# Patient Record
Sex: Female | Born: 1935
Health system: Southern US, Community
[De-identification: ages and names within clinical notes are randomized; demographics above are authoritative.]

## PROBLEM LIST (undated history)

## (undated) ENCOUNTER — Emergency Department: Discharge status: Absent without leave | Payer: Self-pay

## (undated) DIAGNOSIS — G20A1 Parkinson's disease without dyskinesia, without mention of fluctuations: Secondary | ICD-10-CM

## (undated) DIAGNOSIS — C801 Malignant (primary) neoplasm, unspecified: Secondary | ICD-10-CM

## (undated) DIAGNOSIS — M1990 Unspecified osteoarthritis, unspecified site: Secondary | ICD-10-CM

## (undated) DIAGNOSIS — L309 Dermatitis, unspecified: Secondary | ICD-10-CM

## (undated) DIAGNOSIS — G20 Parkinson's disease: Secondary | ICD-10-CM

## (undated) HISTORY — DX: Malignant (primary) neoplasm, unspecified: C80.1

## (undated) HISTORY — DX: Unspecified osteoarthritis, unspecified site: M19.90

## (undated) HISTORY — DX: Parkinson's disease: G20

## (undated) HISTORY — DX: Dermatitis, unspecified: L30.9

## (undated) HISTORY — DX: Parkinson's disease without dyskinesia, without mention of fluctuations: G20.A1

## (undated) HISTORY — PX: BRAIN SURGERY: SHX531

---

## 2002-09-18 HISTORY — PX: SPINE SURGERY: SHX786

## 2009-08-20 ENCOUNTER — Encounter: Payer: Self-pay | Admitting: Family Medicine

## 2009-09-02 ENCOUNTER — Encounter: Payer: Self-pay | Admitting: Family Medicine

## 2010-02-22 ENCOUNTER — Ambulatory Visit: Payer: Self-pay | Admitting: Family Medicine

## 2010-02-22 DIAGNOSIS — M1990 Unspecified osteoarthritis, unspecified site: Secondary | ICD-10-CM | POA: Insufficient documentation

## 2010-02-22 DIAGNOSIS — F411 Generalized anxiety disorder: Secondary | ICD-10-CM | POA: Insufficient documentation

## 2010-02-22 DIAGNOSIS — E785 Hyperlipidemia, unspecified: Secondary | ICD-10-CM | POA: Insufficient documentation

## 2010-02-22 DIAGNOSIS — G20 Parkinson's disease: Secondary | ICD-10-CM

## 2010-02-25 ENCOUNTER — Telehealth: Payer: Self-pay | Admitting: Family Medicine

## 2010-03-10 ENCOUNTER — Encounter: Payer: Self-pay | Admitting: Family Medicine

## 2010-03-17 ENCOUNTER — Encounter: Payer: Self-pay | Admitting: Family Medicine

## 2010-03-18 LAB — CONVERTED CEMR LAB
AST: 16 units/L (ref 0–37)
Albumin: 4.1 g/dL (ref 3.5–5.2)
Alkaline Phosphatase: 78 units/L (ref 39–117)
BUN: 26 mg/dL — ABNORMAL HIGH (ref 6–23)
Calcium: 9.7 mg/dL (ref 8.4–10.5)
Chloride: 106 meq/L (ref 96–112)
Glucose, Bld: 105 mg/dL — ABNORMAL HIGH (ref 70–99)
HDL: 64 mg/dL (ref >39)
Hemoglobin: 13 g/dL (ref 12.0–15.0)
LDL Cholesterol: 145 mg/dL — ABNORMAL HIGH (ref 0–99)
MCHC: 31 g/dL (ref 30.0–36.0)
Potassium: 4.8 meq/L (ref 3.5–5.3)
RBC: 4.21 M/uL (ref 3.87–5.11)
RDW: 14 % (ref 11.5–15.5)
Sodium: 139 meq/L (ref 135–145)
Total Protein: 6.6 g/dL (ref 6.0–8.3)

## 2010-03-25 ENCOUNTER — Encounter: Payer: Self-pay | Admitting: Family Medicine

## 2010-04-01 ENCOUNTER — Ambulatory Visit: Payer: Self-pay | Admitting: Family Medicine

## 2010-04-01 DIAGNOSIS — I10 Essential (primary) hypertension: Secondary | ICD-10-CM | POA: Insufficient documentation

## 2010-05-03 ENCOUNTER — Ambulatory Visit: Payer: Self-pay | Admitting: Family Medicine

## 2010-05-17 ENCOUNTER — Telehealth: Payer: Self-pay | Admitting: Family Medicine

## 2010-06-13 ENCOUNTER — Telehealth: Payer: Self-pay | Admitting: Family Medicine

## 2010-06-16 ENCOUNTER — Telehealth: Payer: Self-pay | Admitting: Family Medicine

## 2010-07-01 ENCOUNTER — Ambulatory Visit: Payer: Self-pay | Admitting: Family Medicine

## 2010-07-01 DIAGNOSIS — R350 Frequency of micturition: Secondary | ICD-10-CM

## 2010-07-01 LAB — CONVERTED CEMR LAB
Blood in Urine, dipstick: NEGATIVE
Glucose, Urine, Semiquant: NEGATIVE
Ketones, urine, test strip: NEGATIVE
Protein, U semiquant: NEGATIVE
Specific Gravity, Urine: 1.015
pH: 6

## 2010-07-04 LAB — CONVERTED CEMR LAB
ALT: 8 units/L (ref 0–35)
AST: 13 units/L (ref 0–37)
Albumin: 4.2 g/dL (ref 3.5–5.2)
CO2: 26 meq/L (ref 19–32)
Calcium: 9.9 mg/dL (ref 8.4–10.5)
Chloride: 106 meq/L (ref 96–112)
Direct LDL: 86 mg/dL
Potassium: 4.4 meq/L (ref 3.5–5.3)
Sodium: 140 meq/L (ref 135–145)
Total Protein: 6.7 g/dL (ref 6.0–8.3)

## 2010-07-07 ENCOUNTER — Encounter: Payer: Self-pay | Admitting: Family Medicine

## 2010-07-25 ENCOUNTER — Encounter: Payer: Self-pay | Admitting: Family Medicine

## 2010-08-25 ENCOUNTER — Encounter: Payer: Self-pay | Admitting: Family Medicine

## 2010-09-30 ENCOUNTER — Encounter: Payer: Self-pay | Admitting: Family Medicine

## 2010-10-03 ENCOUNTER — Ambulatory Visit
Admission: RE | Admit: 2010-10-03 | Discharge: 2010-10-03 | Payer: Self-pay | Source: Home / Self Care | Attending: Family Medicine | Admitting: Family Medicine

## 2010-10-10 ENCOUNTER — Encounter: Payer: Self-pay | Admitting: Family Medicine
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
## 2010-10-18 NOTE — Letter (Signed)
Summary: [facility]  [id number]   Imported By: Lanelle Bal 04/05/2010 14:41:09  _____________________________________________________________________  External Attachment:    Type:   Image     Comment:   External Document

## 2010-10-18 NOTE — Progress Notes (Signed)
Summary: Citalopram dose  Phone Note Call from Patient   Caller: Patient Summary of Call: Pt states she saw psych Dr. Cammy Copa at [facility] and he advised Pt to increase citalopram  but doesn't want to do this w/out consulting you. Pt states Dr. Farris Has would like to speak w/ you. Please call him at [phone]. Initial call taken by: Payton Spark CMA,  February 25, 2010 11:25 AM  Follow-up for Phone Call        Will contact pt in 10 days to see if we can go up to 20 mg of Citalopram daily.   Follow-up by: Seymour Bars DO,  February 25, 2010 12:00 PM     Appended Document: Citalopram dose Marcelino Duster, Pls call pt and let her know that I recieved call from Dr Farris Has.  he wants me to go up on her Citalopram dose to 20 mg once a day.  If she is doing OK on the 10's, let's go ahead and increase to 20 mg once a day.  She should have a f/u with me in about 1 month.  Seymour Bars, D.O.  Appended Document: Citalopram dose Pt aware of the above

## 2010-10-18 NOTE — Miscellaneous (Signed)
Summary: old records  Clinical Lists Changes  Observations: Added new observation of PAST MED HX: Parkinsons Dz- WF neuro s/p DBS  x 2 osteoarthritis asteotosis dermatitis hx of skin cancer-SCC  was seeing derm in [place] (03/10/2010 8:18) Added new observation of PRIMARY MD: Seymour Bars DO (03/10/2010 8:18)       Past History:  Past Medical History: Parkinsons Dz- WF neuro s/p DBS  x 2 osteoarthritis asteotosis dermatitis hx of skin cancer-SCC  was seeing derm in [place]

## 2010-10-18 NOTE — Letter (Signed)
Summary: [facility]  [facility]   Imported By: Lanelle Bal 08/01/2010 08:07:21  _____________________________________________________________________  External Attachment:    Type:   Image     Comment:   External Document

## 2010-10-18 NOTE — Consult Note (Signed)
Summary: [facility]  [facility]   Imported By: Lanelle Bal 08/05/2010 09:00:02  _____________________________________________________________________  External Attachment:    Type:   Image     Comment:   External Document

## 2010-10-18 NOTE — Letter (Signed)
Summary: Dermatology Records Dated 01-06-05 thru 04-22-08/Fred Novice MD  Dermatology Records Dated 01-06-05 thru 04-22-08/Fred Novice MD   Imported By: Lanelle Bal 03/23/2010 10:20:59  _____________________________________________________________________  External Attachment:    Type:   Image     Comment:   External Document

## 2010-10-18 NOTE — Progress Notes (Signed)
Summary: med  Phone Note Call from Patient Call back at [facility] Phone [phone]   Caller: Patient Call For: Seymour Bars DO Summary of Call: Pt calls and wants to know why you sent in Xanax rx instead of her Lorazepam 1mg . She wants the Lorazepam. Please call Initial call taken by: Kathlene November LPN,  June 16, 2010 12:49 PM  Follow-up for Phone Call        I can change her back if she did not pick up the alprazolam this month. Follow-up by: Seymour Bars DO,  June 17, 2010 8:08 AM  Additional Follow-up for Phone Call Additional follow up Details #1::        Pt notified. Did pick up the Xanax and she will take this this month but prefers from now on to have the Ativan. Additional Follow-up by: Kathlene November LPN,  June 17, 2010 9:22 AM    Additional Follow-up for Phone Call Additional follow up Details #2::    ok. Follow-up by: Seymour Bars DO,  June 17, 2010 10:03 AM

## 2010-10-18 NOTE — Letter (Signed)
Summary: Depression Questionnaire  Depression Questionnaire   Imported By: Lanelle Bal 04/11/2010 08:20:03  _____________________________________________________________________  External Attachment:    Type:   Image     Comment:   External Document

## 2010-10-18 NOTE — Assessment & Plan Note (Signed)
Summary: f/u PD/ mood/ bladder problems   Vital Signs:  Patient profile:   76 year old female Height:      62 inches Weight:      171 pounds BMI:     31.39 O2 Sat:      96 % on Room air Pulse rate:   68 / minute BP sitting:   133 / 77  (left arm) Cuff size:   regular  Vitals Entered By: Payton Spark CMA (July 01, 2010 1:20 PM)  O2 Flow:  Room air CC: F/U. Prefers Ativan over klonpin and alprazolam when time for next refill.    Primary Care Provider:  Seymour Bars DO  CC:  F/U. Prefers Ativan over klonpin and alprazolam when time for next refill. Marland Kitchen  History of Present Illness: 76 yo WF presents for f/u visit.  For her HTN, she was started and then increased on Atenolol since last visit.  Doing well on it.  Denies CP but has chronic DOE.  She had PFTs done prior to her DBS in the Spring and was told that her PD may be affecting her diaphragm.  She feels like this is getting worse and she cannot fill her lungs w/ air.    Having dysuria.  Needs hat.  Having urinary frequency.  Having some incontinence. Having some flank pain and incomplete empying.  She has a parkinsons f/u with Dr Ardelle Park next wk.  On Sinemet.  s/p DBS x 2.  Seeing psychologist.  Court battle is over.  Doing well on 40 mg of Citalopram daily.  Wants to stick with with Ativan for as needed use.      Current Medications (verified): 1)  Sinemet 25-100 Mg Tabs (Carbidopa-Levodopa) .... Take 1 1/2 Tab By Mouth Every 2 Hours 2)  Mirapex 0.5 Mg Tabs (Pramipexole Dihydrochloride) .... Take 1 Tab By Mouth Three Times A Day 3)  Citalopram Hydrobromide 20 Mg Tabs (Citalopram Hydrobromide) .Marland Kitchen.. 1 Tab By Mouth At Bedtime X 2 Wks Then 1.5 Tabs By Mouth At Bedtime X 2 Wks Then 2 Tabs By Mouth Qhs 4)  Simvastatin 20 Mg Tabs (Simvastatin) .Marland Kitchen.. 1 Tab By Mouth Qhs 5)  Atenolol 50 Mg Tabs (Atenolol) .Marland Kitchen.. 1 Tab By Mouth Daily 6)  Etodolac 400 Mg Tabs (Etodolac) .Marland Kitchen.. 1 Tab By Mouth Two Times A Day With Food As Needed For Joint  Pain  Allergies (verified): 1)  ! Codeine  Past History:  Past Medical History: Reviewed history from 03/10/2010 and no changes required. Parkinsons Dz- WF neuro s/p DBS  x 2 osteoarthritis asteotosis dermatitis hx of skin cancer-SCC  was seeing derm in [place]  Past Surgical History: Reviewed history from 02/22/2010 and no changes required. cervical spine fusion. 2004 deep brain stimulator 9-10 and 4-11  Family History: Reviewed history from 02/22/2010 and no changes required. mother healthy, 43 father died 83s, CVA? sister in [place] - healthy  no fam hx of parkinsons dz.  Social History: Reviewed history from 02/22/2010 and no changes required. 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  - daughter here in [place], daughter in [place], daughter in [place] (youngest) Never smoked.  Denies ETOH. Active.  Lives alone.  Review of Systems      See HPI  Physical Exam  General:  alert, well-developed, well-nourished, and well-hydrated.   Eyes:  sclera non icteric Mouth:  pharynx pink and moist.   Chest Wall:  no tenderness.   Lungs:  clear breath sounds, labored with deep inspiration, No W/R/R,  Heart:  Normal rate and regular rhythm. S1 and S2 normal without gallop, murmur, click, rub or other extra sounds. Extremities:  no LE edema Neurologic:  resting tremor - head, hands, body Skin:  color normal.   Psych:  good eye contact, not anxious appearing, and not depressed appearing.     Impression & Recommendations:  Problem # 1:  FREQUENCY, URINARY (ICD-788.41) UA is normal.  Suspect neurogenic bladder secondary to PD.  Will refer to urology for a PVR/ bladder scan. Orders: UA Dipstick w/o Micro (automated)  (81003) Urology Referral (Urology)  Problem # 2:  ESSENTIAL HYPERTENSION, BENIGN (ICD-401.1) Assessment: Improved  Her updated medication list for this problem includes:    Atenolol 50 Mg Tabs (Atenolol) .Marland Kitchen... 1 tab by mouth  daily  Orders: T-Comprehensive Metabolic Panel ([id number])  BP today: 133/77 Prior BP: 141/87 (05/03/2010)  Labs Reviewed: K+: 4.8 (03/17/2010) Creat: : 0.85 (03/17/2010)   Chol: 225 (03/17/2010)   HDL: 64 (03/17/2010)   LDL: 145 (03/17/2010)   TG: 81 (03/17/2010)  Problem # 3:  OSTEOARTHRITIS, MODERATE (ICD-715.90) Chronic daily pain.  Had been using Etodolac but sporadically. I have asked her to alternate scheduled Etodolac with Tyelnol arthritis for pain. Her updated medication list for this problem includes:    Etodolac 400 Mg Tabs (Etodolac) .Marland Kitchen... 1 tab by mouth two times a day with food as needed for joint pain  Problem # 4:  ANXIETY STATE, UNSPECIFIED (ICD-300.00) Doing well on higher dose of Citalopram with as needed use of Ativan and counseling. The following medications were removed from the medication list:    Klonopin 0.5 Mg Tabs (Clonazepam) .Marland Kitchen... 1 tab by mouth at bedtime as needed anxiety/ sleep    Alprazolam 1 Mg Tabs (Alprazolam) .Marland Kitchen... 1 tab by mouth daily as needed for anxiety Her updated medication list for this problem includes:    Citalopram Hydrobromide 40 Mg Tabs (Citalopram hydrobromide) .Marland Kitchen... 1 tab by mouth daily    Ativan 0.5 Mg Tabs (Lorazepam) .Marland Kitchen... 1 tab by mouth two times a day as needed anxiety  Problem # 5:  HYPERLIPIDEMIA (ICD-272.4) Due for LDL and LFTs. Her updated medication list for this problem includes:    Simvastatin 20 Mg Tabs (Simvastatin) .Marland Kitchen... 1 tab by mouth qhs  Orders: T-Lipoprotein (LDL cholesterol)  ([id number])  Labs Reviewed: SGOT: 16 (03/17/2010)   SGPT: <8 U/L (03/17/2010)   HDL:64 (03/17/2010)  LDL:145 (03/17/2010)  Chol:225 (03/17/2010)  Trig:81 (03/17/2010)  Problem # 6:  PARKINSON'S DISEASE (ICD-332.0) Has f/u with Dr Ardelle Park next wk.  She needs to talk to him about the effects of her medications.  She is not happy w/ the results of her previous 2 DBS procedures.  I suspect that she has a neurogenic bladder and  diaphragm dysunction in relation to her PD. Will ask Dr Ardelle Park to address breathing issue - if she needs Pulm referral or not.  Apparently had PFTS this spring in [place]> Orders: Urology Referral (Urology)  Complete Medication List: 1)  Sinemet 25-100 Mg Tabs (Carbidopa-levodopa) .... Take 1 1/2 tab by mouth every 2 hours 2)  Mirapex 0.5 Mg Tabs (Pramipexole dihydrochloride) .... Take 1 tab by mouth three times a day 3)  Citalopram Hydrobromide 40 Mg Tabs (Citalopram hydrobromide) .Marland Kitchen.. 1 tab by mouth daily 4)  Simvastatin 20 Mg Tabs (Simvastatin) .Marland Kitchen.. 1 tab by mouth qhs 5)  Atenolol 50 Mg Tabs (Atenolol) .Marland Kitchen.. 1 tab by mouth daily 6)  Etodolac 400 Mg Tabs (Etodolac) .Marland Kitchen.. 1 tab by mouth two  times a day with food as needed for joint pain 7)  Ativan 0.5 Mg Tabs (Lorazepam) .Marland Kitchen.. 1 tab by mouth two times a day as needed anxiety  Patient Instructions: 1)  UA today. 2)  If normal, will refer to urology for a post void residual ultrasound. 3)  I will send a note back to Dr Ardelle Park re: neuro vs pulm referral for diaphgram problems (?). 4)  For arthritis pain:  Take Etodolac with breakfast and dinner  and Tylenol arthritis with lunch and bedtime. 5)  Labs today.  Will call you w/ results on Monday. 6)  Return for f/u in 3 mos.   Prescriptions: ETODOLAC 400 MG TABS (ETODOLAC) 1 tab by mouth two times a day with food as needed for joint pain  #60 x 3   Entered and Authorized by:   Seymour Bars DO   Signed by:   Seymour Bars DO on 07/01/2010   Method used:   Electronically to        [facility]* (retail)       [address]       [place], [place]  [address]       Ph: [phone]       Fax: [phone]   RxID:   [id number] ATIVAN 0.5 MG TABS (LORAZEPAM) 1 tab by mouth two times a day as needed anxiety  #60 x 0   Entered and Authorized by:   Seymour Bars DO   Signed by:   Seymour Bars DO on 07/01/2010   Method used:   Printed then faxed to ...       [facility]*  (retail)       [address]       [place], [place]  [address]       Ph: [phone]       Fax: [phone]   RxID:   [id number] CITALOPRAM HYDROBROMIDE 40 MG TABS (CITALOPRAM HYDROBROMIDE) 1 tab by mouth daily  #30 x 6   Entered and Authorized by:   Seymour Bars DO   Signed by:   Seymour Bars DO on 07/01/2010   Method used:   Electronically to        [facility]* (retail)       [address]       [place], [place]  [address]       Ph: [phone]       Fax: [phone]   RxID:   [id number]   Laboratory Results   Urine Tests    Routine Urinalysis   Color: yellow Appearance: Clear Glucose: negative   (Normal Range: Negative) Bilirubin: small   (Normal Range: Negative) Ketone: negative   (Normal Range: Negative) Spec. Gravity: 1.015   (Normal Range: 1.003-1.035) Blood: negative   (Normal Range: Negative) pH: 6.0   (Normal Range: 5.0-8.0) Protein: negative   (Normal Range: Negative) Urobilinogen: 0.2   (Normal Range: 0-1) Nitrite: negative   (Normal Range: Negative) Leukocyte Esterace: trace   (Normal Range: Negative)       Appended Document: f/u PD/ mood/ bladder problems Pls let pt know that her UA came back normal.  I am going to proceed with a urology referral for a post void residal bladder scan.  Seymour Bars, D.O.  Appended Document: f/u PD/ mood/ bladder problems Pt aware of the above

## 2010-10-18 NOTE — Letter (Signed)
Summary: [facility]  [facility]   Imported By: Lanelle Bal 07/30/2010 11:45:36  _____________________________________________________________________  External Attachment:    Type:   Image     Comment:   External Document

## 2010-10-18 NOTE — Progress Notes (Signed)
Summary: Ativan Rx  Phone Note Call from Patient   Caller: Patient Summary of Call: Pt states she went to see Dr. Cammy Copa and he suggested she be changed from Alprazolam to Ativan. Pt states she is not sure why he is not making the changes and filling meds but he advised her to call here for med changes. Please advise. Initial call taken by: Payton Spark CMA,  May 17, 2010 9:24 AM    New/Updated Medications: ATIVAN 0.5 MG TABS (LORAZEPAM) 1 tab by mouth daily as needed for anxiety Prescriptions: ATIVAN 0.5 MG TABS (LORAZEPAM) 1 tab by mouth daily as needed for anxiety  #30 x 0   Entered and Authorized by:   Seymour Bars DO   Signed by:   Seymour Bars DO on 05/17/2010   Method used:   Printed then faxed to ...       [facility]* (retail)       [address]       [place], [place]  [address]       Ph: [phone]       Fax: [phone]   RxID:   [id number]   Appended Document: Ativan Rx Pt aware.

## 2010-10-18 NOTE — Assessment & Plan Note (Signed)
Summary: BP check  Nurse Visit   Vital Signs:  Patient profile:   76 year old female Pulse rate:   75 / minute BP sitting:   141 / 87  (left arm) Cuff size:   regular  Vitals Entered By: Payton Spark CMA (May 03, 2010 1:39 PM)  Current Medications (verified): 1)  Sinemet 25-100 Mg Tabs (Carbidopa-Levodopa) .... Take 1 1/2 Tab By Mouth Every 2 Hours 2)  Mirapex 0.5 Mg Tabs (Pramipexole Dihydrochloride) .... Take 1 Tab By Mouth Three Times A Day 3)  Klonopin 0.5 Mg Tabs (Clonazepam) .Marland Kitchen.. 1 Tab By Mouth At Bedtime As Needed Anxiety/ Sleep 4)  Citalopram Hydrobromide 20 Mg Tabs (Citalopram Hydrobromide) .Marland Kitchen.. 1 Tab By Mouth At Bedtime X 2 Wks Then 1.5 Tabs By Mouth At Bedtime X 2 Wks Then 2 Tabs By Mouth Qhs 5)  Simvastatin 20 Mg Tabs (Simvastatin) .Marland Kitchen.. 1 Tab By Mouth Qhs 6)  Alprazolam 0.5 Mg Tabs (Alprazolam) .Marland Kitchen.. 1 Tab By Mouth Daily As Needed For Anxiety (For Daytime Use) 7)  Atenolol 25 Mg Tabs (Atenolol) .Marland Kitchen.. 1 Tab By Mouth Daily 8)  Etodolac 400 Mg Tabs (Etodolac) .Marland Kitchen.. 1 Tab By Mouth Two Times A Day With Food As Needed For Joint Pain  Allergies (verified): 1)  ! Codeine  Orders Added: 1)  Est. Patient Level I [[address]] Prescriptions: ATENOLOL 50 MG TABS (ATENOLOL) 1 tab by mouth daily  #30 x 3   Entered and Authorized by:   Seymour Bars DO   Signed by:   Seymour Bars DO on 05/03/2010   Method used:   Electronically to        [facility]* (retail)       [address]       [place], [place]  [address]       Ph: [phone]       Fax: [phone]   RxID:   [id number]    CC: Follow-up HTN HPI: Taking meds? yes     Side effects? no   Chest pain, SOB, Dizziness? no   A/P: HTN (401.1) At goal? If no, Patient will be notified.  5 minutes was spent with patient.   Impression & Recommendations:  Problem # 1:  ESSENTIAL HYPERTENSION, BENIGN (ICD-401.1) BP still too high.  Will increase Atenolol from 25 mg --> 50 mg/ day.  HR is normal  today. Recheck at routine f/u visit in October. Her updated medication list for this problem includes:    Atenolol 50 Mg Tabs (Atenolol) .Marland Kitchen... 1 tab by mouth daily  Orders: Est. Patient Level I ([address])  BP today: 141/87 Prior BP: 159/94 (04/01/2010)  Labs Reviewed: K+: 4.8 (03/17/2010) Creat: : 0.85 (03/17/2010)   Chol: 225 (03/17/2010)   HDL: 64 (03/17/2010)   LDL: 145 (03/17/2010)   TG: 81 (03/17/2010)  Complete Medication List: 1)  Sinemet 25-100 Mg Tabs (Carbidopa-levodopa) .... Take 1 1/2 tab by mouth every 2 hours 2)  Mirapex 0.5 Mg Tabs (Pramipexole dihydrochloride) .... Take 1 tab by mouth three times a day 3)  Klonopin 0.5 Mg Tabs (Clonazepam) .Marland Kitchen.. 1 tab by mouth at bedtime as needed anxiety/ sleep 4)  Citalopram Hydrobromide 20 Mg Tabs (Citalopram hydrobromide) .Marland Kitchen.. 1 tab by mouth at bedtime x 2 wks then 1.5 tabs by mouth at bedtime x 2 wks then 2 tabs by mouth qhs 5)  Simvastatin 20 Mg Tabs (Simvastatin) .Marland Kitchen.. 1 tab by mouth qhs 6)  Alprazolam 0.5 Mg Tabs (Alprazolam) .Marland Kitchen.. 1 tab by mouth  daily as needed for anxiety (for daytime use) 7)  Atenolol 50 Mg Tabs (Atenolol) .Marland Kitchen.. 1 tab by mouth daily 8)  Etodolac 400 Mg Tabs (Etodolac) .Marland Kitchen.. 1 tab by mouth two times a day with food as needed for joint pain   Appended Document: BP check LMOM informing Pt of the above

## 2010-10-18 NOTE — Progress Notes (Signed)
Summary: Med  Phone Note Call from Patient Call back at Home Phone [phone]   Caller: Patient Call For: Seymour Bars DO Summary of Call: Pt calls and states that her psychiatrist suggest her Ativan be increased. Please advise Initial call taken by: Kathlene November,  June 13, 2010 1:41 PM  Follow-up for Phone Call        in strength or frequency? Follow-up by: Seymour Bars DO,  June 13, 2010 2:04 PM  Additional Follow-up for Phone Call Additional follow up Details #1::        Strength Additional Follow-up by: Kathlene November,  June 13, 2010 2:06 PM    New/Updated Medications: ALPRAZOLAM 1 MG TABS (ALPRAZOLAM) 1 tab by mouth daily as needed for anxiety Prescriptions: ALPRAZOLAM 1 MG TABS (ALPRAZOLAM) 1 tab by mouth daily as needed for anxiety  #30 x 0   Entered and Authorized by:   Seymour Bars DO   Signed by:   Seymour Bars DO on 06/13/2010   Method used:   Printed then faxed to ...       [facility]* (retail)       [address]       [place], [place]  [address]       Ph: [phone]       Fax: [phone]   RxID:   [id number]   Appended Document: Med 06/13/2010 @ 2:56pm- Pt notified of above. KJ LPN

## 2010-10-20 NOTE — Letter (Signed)
Summary: [facility]  [facility]   Imported By: Lanelle Bal 10/13/2010 11:22:19  _____________________________________________________________________  External Attachment:    Type:   Image     Comment:   External Document

## 2010-10-20 NOTE — Assessment & Plan Note (Signed)
Summary: f/u PD/ mood   Vital Signs:  Patient profile:   75 year old female Height:      62 inches Weight:      177 pounds BMI:     32.49 O2 Sat:      96 % on Room air Pulse rate:   62 / minute BP sitting:   126 / 77  (left arm) Cuff size:   regular  Vitals Entered By: Payton Spark CMA (October 03, 2010 1:21 PM)  O2 Flow:  Room air CC: F/U.   Primary Care Provider:  Seymour Bars DO  CC:  F/U.Marland Kitchen  History of Present Illness: 76 yo WF presents for f/u visit.  She was put on Dr McDiamid for her bladder and is doing better on Vesicare.  She has a lot of lower back pain from her arthritis and has chronic HAs ever since her stimulator was placed.  She is taking Tylneol but needing ' too much' for comfort.  Not using her CPAP due to runny nose.  She takes Etodolac but this seems to make her sleepy so she's only taking it at night.    Needing Pepcid AC for heartburn which does help.      Current Medications (verified): 1)  Sinemet 25-100 Mg Tabs (Carbidopa-Levodopa) .... Take 1 1/2 Tab By Mouth Every 2 Hours 2)  Mirapex 0.5 Mg Tabs (Pramipexole Dihydrochloride) .... Take 1 Tab By Mouth Three Times A Day 3)  Simvastatin 20 Mg Tabs (Simvastatin) .Marland Kitchen.. 1 Tab By Mouth Qhs 4)  Atenolol 50 Mg Tabs (Atenolol) .Marland Kitchen.. 1 Tab By Mouth Daily 5)  Etodolac 400 Mg Tabs (Etodolac) .... Take 1 Tab By Mouth At Bedtime As Needed For Joint Pain 6)  Ativan 0.5 Mg Tabs (Lorazepam) .Marland Kitchen.. 1 Tab By Mouth Two Times A Day As Needed Anxiety 7)  Vesicare 5 Mg Tabs (Solifenacin Succinate) .... Take 1 Tab By Mouth Once Daily  Allergies (verified): 1)  ! Codeine  Past History:  Past Medical History: Parkinsons Dz- WF neuro s/p DBS  x 2 osteoarthritis asteotosis dermatitis hx of skin cancer-SCC  was seeing derm in [place] pulm Dr Lonn [place]  Past Surgical History: Reviewed history from 02/22/2010 and no changes required. cervical spine fusion. 2004 deep brain stimulator 9-10 and 4-11  Social  History: Reviewed history from 02/22/2010 and no changes required. 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  - daughter here in [place], daughter in [place], daughter in [place] (youngest) Never smoked.  Denies ETOH. Active.  Lives alone.  Review of Systems      See HPI  Physical Exam  General:  alert, well-developed, well-nourished, and well-hydrated.   Mouth:  pharynx pink and moist.   Neck:  no masses.   Chest Wall:  no tenderness.   Lungs:  clear breath sounds, labored with deep inspiration, No W/R/R, Heart:  Normal rate and regular rhythm. S1 and S2 normal without gallop, murmur, click, rub or other extra sounds. Abdomen:  soft and non-tender.   Extremities:  no LEedema Neurologic:  resting tremor Skin:  color normal.   Psych:  good eye contact, not anxious appearing, and not depressed appearing.     Impression & Recommendations:  Problem # 1:  OSTEOARTHRITIS, MODERATE (ICD-715.90) Back pain continues to bother her, worsened by weight gain and lack of exercise with worsening parkinsons. Had sedation from Etodolac so I will change her to Celebrex 1-2 x a day alternating with Tylenol for a total of up to 3 g/  day.   Her updated medication list for this problem includes:    Celebrex 200 Mg Caps (Celecoxib) .Marland Kitchen... 1 capsule by mouth once to twice daily  Problem # 2:  ESSENTIAL HYPERTENSION, BENIGN (ICD-401.1) BP at goal on current meds.  Continuel  Labs UTD.   Her updated medication list for this problem includes:    Atenolol 50 Mg Tabs (Atenolol) .Marland Kitchen... 1 tab by mouth daily  BP today: 126/77 Prior BP: 133/77 (07/01/2010)  Labs Reviewed: K+: 4.4 (07/01/2010) Creat: : 0.77 (07/01/2010)   Chol: 225 (03/17/2010)   HDL: 64 (03/17/2010)   LDL: 145 (03/17/2010)   TG: 81 (03/17/2010)  Problem # 3:  ANXIETY STATE, UNSPECIFIED (ICD-300.00) Pt stopped Citalopram, not sure why and is doing well with just as needed Ativan.  She seems to be past her court battle and is doing  better. The following medications were removed from the medication list:    Citalopram Hydrobromide 40 Mg Tabs (Citalopram hydrobromide) .Marland Kitchen... 1 tab by mouth daily Her updated medication list for this problem includes:    Ativan 0.5 Mg Tabs (Lorazepam) .Marland Kitchen... 1 tab by mouth two times a day as needed anxiety  Problem # 4:  PARKINSON'S DISEASE (ICD-332.0) Has f/u with Dr Ardelle Park and is seeing Dr Lonn [place] (pulm) to see if she has diaphragmatic involvment causing difficulty with breathing.    Complete Medication List: 1)  Sinemet 25-100 Mg Tabs (Carbidopa-levodopa) .... Take 1 1/2 tab by mouth every 2 hours 2)  Mirapex 0.5 Mg Tabs (Pramipexole dihydrochloride) .... Take 1 tab by mouth three times a day 3)  Simvastatin 20 Mg Tabs (Simvastatin) .Marland Kitchen.. 1 tab by mouth qhs 4)  Atenolol 50 Mg Tabs (Atenolol) .Marland Kitchen.. 1 tab by mouth daily 5)  Celebrex 200 Mg Caps (Celecoxib) .Marland Kitchen.. 1 capsule by mouth once to twice daily 6)  Ativan 0.5 Mg Tabs (Lorazepam) .Marland Kitchen.. 1 tab by mouth two times a day as needed anxiety 7)  Vesicare 5 Mg Tabs (Solifenacin succinate) .... Take 1 tab by mouth once daily 8)  Amantadine Hcl 100 Mg Tabs (Amantadine hcl) .Marland Kitchen.. 1 tab by mouth tid 9)  Pepcid 20 Mg Tabs (Famotidine) .Marland Kitchen.. 1 tab by mouth two times a day  Patient Instructions: 1)  Alternate Tylenol 1000 mg 2-3 x a day with Celebrex 200 mg 1-2 x a day for back arthritis pain. 2)  Add PT if not improved after 3 wks. 3)  Return for f/u in 2 mos. Prescriptions: CELEBREX 200 MG CAPS (CELECOXIB) 1 capsule by mouth once to twice daily  #60 x 2   Entered and Authorized by:   Seymour Bars DO   Signed by:   Seymour Bars DO on 10/03/2010   Method used:   Electronically to        [facility]* (retail)       [address]       [place], [place]  [address]       Ph: [phone]       Fax: [phone]   RxID:   [id number]    Orders Added: 1)  Est. Patient Level IV [[address]]

## 2010-10-20 NOTE — Letter (Signed)
Summary: [facility]  [facility]   Imported By: Lanelle Bal 09/28/2010 09:55:24  _____________________________________________________________________  External Attachment:    Type:   Image     Comment:   External Document

## 2010-11-09 NOTE — Medication Information (Signed)
Summary: Approval for Celebrex/[employer]  Approval for Celebrex/[employer]   Imported By: Lanelle Bal 11/01/2010 12:28:44  _____________________________________________________________________  External Attachment:    Type:   Image     Comment:   External Document

## 2010-12-02 ENCOUNTER — Encounter: Payer: Self-pay | Admitting: Family Medicine

## 2010-12-02 ENCOUNTER — Ambulatory Visit (INDEPENDENT_AMBULATORY_CARE_PROVIDER_SITE_OTHER): Payer: Medicare Other | Admitting: Family Medicine

## 2010-12-02 DIAGNOSIS — G20 Parkinson's disease: Secondary | ICD-10-CM

## 2010-12-02 DIAGNOSIS — F411 Generalized anxiety disorder: Secondary | ICD-10-CM

## 2010-12-02 DIAGNOSIS — E669 Obesity, unspecified: Secondary | ICD-10-CM | POA: Insufficient documentation
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
## 2010-12-21 ENCOUNTER — Telehealth: Payer: Self-pay | Admitting: *Deleted

## 2010-12-21 MED ORDER — NAPROXEN 500 MG PO TABS: 500.0000 mg | ORAL_TABLET | Freq: Two times a day (BID) | ORAL | Status: AC

## 2010-12-21 NOTE — Telephone Encounter (Signed)
Return call to home number-which is preferred number per pt.  No answer after 8 rings.

## 2010-12-21 NOTE — Telephone Encounter (Signed)
Pt went to see Dr. Roetta Sessions at [facility] for her back pain.  He has recommended PT which pt is agreeable to.  Dr. Roetta Sessions also recommended an antiinflammatory if it will not be contraindicated with any medication the pt is currently on.  Dr. Roetta Sessions will prescribe if it is OK with you.  Please advise.

## 2010-12-21 NOTE — Telephone Encounter (Signed)
Her last Creatinine was normal and I do not see a hx of GI bleed, so I sent in RX for Naprosyn 500 mg 1 tab with breakfast and dinner as needed for back pain.  Call me if any problems.

## 2010-12-22 NOTE — Telephone Encounter (Signed)
Pt.notified

## 2010-12-29 ENCOUNTER — Other Ambulatory Visit: Payer: Self-pay | Admitting: *Deleted

## 2010-12-29 MED ORDER — SIMVASTATIN 20 MG PO TABS: 20.0000 mg | ORAL_TABLET | Freq: Every day | ORAL | Status: DC

## 2011-01-19 ENCOUNTER — Other Ambulatory Visit: Payer: Self-pay | Admitting: *Deleted

## 2011-01-19 MED ORDER — LORAZEPAM 0.5 MG PO TABS: 0.5000 mg | ORAL_TABLET | Freq: Two times a day (BID) | ORAL | Status: DC | PRN

## 2011-01-19 MED ORDER — CELECOXIB 200 MG PO CAPS: 200.0000 mg | ORAL_CAPSULE | Freq: Two times a day (BID) | ORAL | Status: DC

## 2011-01-30 ENCOUNTER — Encounter: Payer: Self-pay | Admitting: Family Medicine

## 2011-02-03 ENCOUNTER — Ambulatory Visit (INDEPENDENT_AMBULATORY_CARE_PROVIDER_SITE_OTHER): Payer: Medicare Other | Admitting: Family Medicine

## 2011-02-03 ENCOUNTER — Encounter: Payer: Self-pay | Admitting: Family Medicine

## 2011-02-03 DIAGNOSIS — G20 Parkinson's disease: Secondary | ICD-10-CM

## 2011-02-03 DIAGNOSIS — F411 Generalized anxiety disorder: Secondary | ICD-10-CM

## 2011-02-03 MED ORDER — FLUOXETINE HCL 20 MG PO CAPS
20.0000 mg | ORAL_CAPSULE | Freq: Every day | ORAL | Status: DC
Start: 2011-02-03 — End: 2011-09-27

## 2011-02-03 MED ORDER — ZOLPIDEM TARTRATE 5 MG PO TABS: 5.0000 mg | ORAL_TABLET | Freq: Every evening | ORAL | Status: DC | PRN

## 2011-02-03 NOTE — Patient Instructions (Signed)
Increase Fluoxetine to 20 mg once daily. OK to use Ativan 1-2 x a day for anxiety but separate last dose from Ambien by 4 hrs.  Allow 8 hrs to sleep after taking Ambien.  F/U with [facility] neurology for Parkinsons.  Work on increasing physical activity to 45 + min most days/ wk.  Return for f/u with fasting labs in 3- 4 mos.

## 2011-02-03 NOTE — Assessment & Plan Note (Signed)
Will increase her fluoxetine dose to 20 mg once daily and stay on ativan prn.  Added a sleep aid for chronic insomnia which is compromising her ability to keep her CPAP on.  Do not take Ativan with Ambien (separate by 4 hrs).  Call if any problems.  F/u in 2-3 mos.

## 2011-02-03 NOTE — Assessment & Plan Note (Signed)
Stable.  Followed by [facility]. I do not suspect her SOB to be related to her PD as she has anxiety and increased truncal obesity as contriuting factors.  We discussed options to get weight off today and increase her phyiscal activity level but she does not seem very motivated.

## 2011-02-03 NOTE — Progress Notes (Signed)
  Subjective:    Patient ID: Shannon Vincent, female    DOB: 1935-03-31, 76 y.o.   MRN: [id number]  HPI  76 yo WF prsents for f/u anxiety.  2 mos ago, I started her on Fluoxetine for anxiety and it has really helped.  She is taking ativan 2 x a day.  Her stress level has improved some.  She is only on 10 mg of Fluoxetine.  She is not taking the Atenolol because BP has been low.  She has had f/u with [facility], pulm and neuro for her Parkinsons.  SOB is unchanged, worsened by anxiety and wt gain.  Eats 'very little' during the day but does limited exercise.  Has gained another 3 lbs since last visit.  BP 111/73  Pulse 95  Ht 5' (1.524 m)  Wt 180 lb (81.647 kg)  BMI 35.15 kg/m2  SpO2 96%     Review of Systems  Constitutional: Negative for fatigue.  Respiratory: Negative for shortness of breath.   Cardiovascular: Negative for chest pain, palpitations and leg swelling.  Gastrointestinal: Negative for nausea.  Genitourinary: Negative for difficulty urinating.  Musculoskeletal: Positive for gait problem.  Neurological: Negative for dizziness and headaches.  Psychiatric/Behavioral: Positive for sleep disturbance. Negative for suicidal ideas, self-injury and dysphoric mood. The patient is nervous/anxious.         Objective:   Physical Exam  Constitutional: She appears well-developed and well-nourished. No distress.  HENT:  Head: Normocephalic and atraumatic.  Neck: Neck supple. No thyromegaly present.  Cardiovascular: Normal rate, regular rhythm and normal heart sounds.   Pulmonary/Chest: Effort normal and breath sounds normal.  Musculoskeletal: She exhibits no edema.  Skin: Skin is warm and dry.  Psychiatric: She has a normal mood and affect.          Assessment & Plan:

## 2011-03-23 ENCOUNTER — Other Ambulatory Visit: Payer: Self-pay | Admitting: *Deleted

## 2011-03-23 MED ORDER — CELECOXIB 200 MG PO CAPS: 200.0000 mg | ORAL_CAPSULE | Freq: Two times a day (BID) | ORAL | Status: DC

## 2011-03-30 ENCOUNTER — Other Ambulatory Visit: Payer: Self-pay | Admitting: *Deleted

## 2011-03-30 MED ORDER — LORAZEPAM 0.5 MG PO TABS: 0.5000 mg | ORAL_TABLET | Freq: Two times a day (BID) | ORAL | Status: DC | PRN

## 2011-04-12 ENCOUNTER — Telehealth: Payer: Self-pay | Admitting: Family Medicine

## 2011-04-12 NOTE — Telephone Encounter (Signed)
Marcelino Duster called from [employer] and said they were sending a form to be signed for a request for pt to get a lumbar brace. Jarvis Newcomer, LPN [facility]

## 2011-04-13 ENCOUNTER — Telehealth: Payer: Self-pay | Admitting: Family Medicine

## 2011-04-13 NOTE — Telephone Encounter (Signed)
Company called again to ask about form they sent for pt to be given auth for back brace.  They sent a certificate to be signed, and said they have not received the certificate back yet.  Please advise. Plan:  Routed the message to Dr. Arlice Colt, LPN [facility]

## 2011-04-13 NOTE — Telephone Encounter (Signed)
I signed this yesterday.  It was in Michelle's box this morning.

## 2011-04-14 ENCOUNTER — Other Ambulatory Visit: Payer: Self-pay | Admitting: Family Medicine

## 2011-04-14 NOTE — Telephone Encounter (Signed)
Back brace order was placed in Shannon Vincent's box to fax. Jarvis Newcomer, LPN [facility]imes

## 2011-05-11 ENCOUNTER — Telehealth: Payer: Self-pay | Admitting: Family Medicine

## 2011-05-11 NOTE — Telephone Encounter (Signed)
Jillyn Hidden calling from [employer].  York Spaniel he was suppose to have receive records from our office regarding pt and her back support.  Received the order for it, but did not receive the medical records. Plan:  Faxed the notes that could be found regarding this issue. Jarvis Newcomer, LPN [facility]

## 2011-06-02 ENCOUNTER — Ambulatory Visit: Payer: Medicare Other | Admitting: Family Medicine

## 2011-06-02 ENCOUNTER — Telehealth: Payer: Self-pay | Admitting: *Deleted

## 2011-06-02 DIAGNOSIS — E785 Hyperlipidemia, unspecified: Secondary | ICD-10-CM

## 2011-06-02 DIAGNOSIS — Z0289 Encounter for other administrative examinations: Secondary | ICD-10-CM

## 2011-06-02 NOTE — Telephone Encounter (Signed)
Needs labs

## 2011-06-03 LAB — COMPLETE METABOLIC PANEL WITH GFR
Albumin: 3.9 g/dL (ref 3.5–5.2)
CO2: 25 mEq/L (ref 19–32)
Calcium: 9.5 mg/dL (ref 8.4–10.5)
Chloride: 107 mEq/L (ref 96–112)
GFR, Est African American: >60 mL/min (ref >60)
GFR, Est Non African American: >60 mL/min (ref >60)
Glucose, Bld: 93 mg/dL (ref 70–99)
Potassium: 4.4 mEq/L (ref 3.5–5.3)
Sodium: 142 mEq/L (ref 135–145)
Total Protein: 6.6 g/dL (ref 6.0–8.3)

## 2011-06-05 ENCOUNTER — Telehealth: Payer: Self-pay | Admitting: *Deleted

## 2011-06-05 NOTE — Telephone Encounter (Signed)
Message copied by Lanae Crumbly on Mon Jun 05, 2011  8:55 AM ------      Message from: Nani Gasser D      Created: Sun Jun 04, 2011  8:51 PM       TC and LDL looks a little better this time.  Recheck in 1 year. Good work. Also blood sugar look sbetter and is in the normal range. Recheck in 1 yr.

## 2011-06-05 NOTE — Telephone Encounter (Signed)
Pt notified of results. KJ LPN 

## 2011-06-24 ENCOUNTER — Other Ambulatory Visit: Payer: Self-pay | Admitting: Family Medicine

## 2011-07-13 ENCOUNTER — Other Ambulatory Visit: Payer: Self-pay | Admitting: *Deleted

## 2011-07-13 MED ORDER — LORAZEPAM 0.5 MG PO TABS: 0.5000 mg | ORAL_TABLET | Freq: Two times a day (BID) | ORAL | Status: DC | PRN

## 2011-07-23 ENCOUNTER — Emergency Department (INDEPENDENT_AMBULATORY_CARE_PROVIDER_SITE_OTHER)
Admission: EM | Admit: 2011-07-23 | Discharge: 2011-07-23 | Disposition: A | Discharge status: Home / Self Care | Payer: Medicare Other | Source: Home / Self Care | Attending: Emergency Medicine | Admitting: Emergency Medicine

## 2011-07-23 ENCOUNTER — Emergency Department: Admit: 2011-07-23 | Discharge: 2011-07-23 | Disposition: A | Discharge status: Home / Self Care | Payer: Medicare Other

## 2011-07-23 ENCOUNTER — Encounter: Payer: Self-pay | Admitting: *Deleted

## 2011-07-23 DIAGNOSIS — S63269A Dislocation of metacarpophalangeal joint of unspecified finger, initial encounter: Secondary | ICD-10-CM

## 2011-07-23 NOTE — ED Provider Notes (Signed)
History   Pt fell accidentally yesterday, jammed left 5th finger.  CSN: [id number] Arrival date & time: 07/23/2011  5:23 PM   First MD Initiated Contact with Patient 07/23/11 1721      Chief Complaint  Patient presents with  . Finger Injury    (Consider location/radiation/quality/duration/timing/severity/associated sxs/prior treatment) Patient is a 76 y.o. female presenting with hand pain. The history is provided by the patient.  Hand Pain This is a new problem. The current episode started yesterday. The problem occurs constantly. The problem has not changed since onset.Pertinent negatives include no chest pain, no abdominal pain, no headaches and no shortness of breath. The symptoms are relieved by nothing.    Past Medical History  Diagnosis Date  . Parkinson disease     [facility]  . Osteoarthritis   . Arthritis     asteotosis  . Dermatitis     asteotosis  . Cancer     Past Surgical History  Procedure Date  . Spine surgery 2004    cervical spine fusion   . Brain surgery 09-10, 04-11    deep brain stimulator    No family history on file.  History  Substance Use Topics  . Smoking status: Never Smoker   . Smokeless tobacco: Not on file  . Alcohol Use: No    OB History    Grav Para Term Preterm Abortions TAB SAB Ect Mult Living                  Review of Systems  Constitutional: Negative.   Respiratory: Negative for shortness of breath.   Cardiovascular: Negative for chest pain.  Gastrointestinal: Negative for abdominal pain.  Neurological: Negative for headaches.  All other systems reviewed and are negative.    Allergies  Codeine  Home Medications   Current Outpatient Rx  Name Route Sig Dispense Refill  . AMANTADINE HCL 100 MG PO CAPS Oral Take 100 mg by mouth 3 (three) times daily.      . AMBIEN 5 MG PO TABS  TAKE ONE TABLET BY MOUTH AT BEDTIME AS NEEDED FORSLEEP 30 each 2  . CARBIDOPA-LEVODOPA 25-100 MG PO TABS Oral Take by mouth. Take 1 (1/2)  tabs by mouth every 3 hours.     . CELEBREX 200 MG PO CAPS  TAKE 1 CAPSULE BY MOUTH  TWICE DAILY 60 each 2  . FAMOTIDINE 10 MG PO TABS Oral Take 10 mg by mouth 2 (two) times daily.      Marland Kitchen FLUOXETINE HCL 20 MG PO CAPS Oral Take 1 capsule (20 mg total) by mouth daily. 30 capsule 5  . FLUTICASONE PROPIONATE 50 MCG/ACT NA SUSP Nasal 2 sprays by Nasal route at bedtime.      Marland Kitchen LORAZEPAM 0.5 MG PO TABS Oral Take 1 tablet (0.5 mg total) by mouth 2 (two) times daily as needed. 60 tablet 1  . NAPROXEN 500 MG PO TABS Oral Take 1 tablet (500 mg total) by mouth 2 (two) times daily with a meal. 60 tablet 2  . PRAMIPEXOLE DIHYDROCHLORIDE 0.5 MG PO TABS Oral Take 0.5 mg by mouth 4 (four) times daily.      Marland Kitchen SIMVASTATIN 20 MG PO TABS Oral Take 1 tablet (20 mg total) by mouth at bedtime. 30 tablet 2  . SOLIFENACIN SUCCINATE 5 MG PO TABS Oral Take 5 mg by mouth daily as needed.        BP 128/85  Pulse 100  Temp(Src) 98.1 F (36.7 C) (Oral)  Resp  16  Ht 5' (1.524 m)  Wt 171 lb (77.565 kg)  BMI 33.40 kg/m2  SpO2 100%  Physical Exam  Nursing note and vitals reviewed. Constitutional: She appears well-developed and well-nourished. No distress.  L 5th finger swollen, tender, decreased ROM proximal interphalangeal joint. She has a flexion boutonniere deformity of the PIP, with ttp. She has diffuse OA changes of all fingers. Lateral deviation of 5th MCPJ.  ED Course  Procedures (including critical care time)  Labs Reviewed - No data to display No results found.   No diagnosis found.    MDM  Xray L 5th finger: "subluxation of the 5th MCP joint. No fx or dislocation."  In my view (and viewed with Dr. Orson Aloe), she may have a tiny chip fx at DIP. Clinically, may have 1)subluxation of the 5th MCP joint. 2) Tendon injury and/or subtle fx of DIP.  Plans: Splint. Ibuprofen. Refer to ortho. Options discussed. Risks, benefits, alternatives discussed. Patient voiced understanding and  agreement.         Lonell Face, MD 07/23/11 2040

## 2011-07-23 NOTE — ED Notes (Signed)
Patient fell last night and jammed her left little figner

## 2011-07-24 ENCOUNTER — Telehealth: Payer: Self-pay | Admitting: Emergency Medicine

## 2011-09-27 ENCOUNTER — Other Ambulatory Visit: Payer: Self-pay | Admitting: *Deleted

## 2011-09-27 MED ORDER — FLUOXETINE HCL 20 MG PO CAPS: 20.0000 mg | ORAL_CAPSULE | Freq: Every day | ORAL | Status: DC

## 2011-10-19 ENCOUNTER — Other Ambulatory Visit: Payer: Self-pay | Admitting: *Deleted

## 2011-10-19 MED ORDER — ZOLPIDEM TARTRATE 5 MG PO TABS: 5.0000 mg | ORAL_TABLET | Freq: Every evening | ORAL | Status: DC | PRN

## 2012-02-29 ENCOUNTER — Other Ambulatory Visit: Payer: Self-pay | Admitting: Family Medicine

## 2013-09-25 DIAGNOSIS — Z6834 Body mass index (BMI) 34.0-34.9, adult: Secondary | ICD-10-CM | POA: Diagnosis not present

## 2013-09-25 DIAGNOSIS — R0602 Shortness of breath: Secondary | ICD-10-CM | POA: Diagnosis not present

## 2013-09-25 DIAGNOSIS — R0609 Other forms of dyspnea: Secondary | ICD-10-CM | POA: Diagnosis not present

## 2013-09-25 DIAGNOSIS — G20 Parkinson's disease: Secondary | ICD-10-CM | POA: Diagnosis not present

## 2013-09-25 DIAGNOSIS — R0989 Other specified symptoms and signs involving the circulatory and respiratory systems: Secondary | ICD-10-CM | POA: Diagnosis not present

## 2013-09-29 DIAGNOSIS — M159 Polyosteoarthritis, unspecified: Secondary | ICD-10-CM | POA: Diagnosis not present

## 2013-09-29 DIAGNOSIS — M069 Rheumatoid arthritis, unspecified: Secondary | ICD-10-CM | POA: Diagnosis not present

## 2013-09-29 DIAGNOSIS — IMO0002 Reserved for concepts with insufficient information to code with codable children: Secondary | ICD-10-CM | POA: Diagnosis not present

## 2013-10-01 DIAGNOSIS — K225 Diverticulum of esophagus, acquired: Secondary | ICD-10-CM | POA: Diagnosis not present

## 2013-10-01 DIAGNOSIS — F458 Other somatoform disorders: Secondary | ICD-10-CM | POA: Diagnosis not present

## 2013-10-01 DIAGNOSIS — G20 Parkinson's disease: Secondary | ICD-10-CM | POA: Diagnosis not present

## 2013-10-10 DIAGNOSIS — Z462 Encounter for fitting and adjustment of other devices related to nervous system and special senses: Secondary | ICD-10-CM | POA: Diagnosis not present

## 2013-10-10 DIAGNOSIS — G20 Parkinson's disease: Secondary | ICD-10-CM | POA: Diagnosis not present

## 2013-10-14 DIAGNOSIS — M159 Polyosteoarthritis, unspecified: Secondary | ICD-10-CM | POA: Diagnosis not present

## 2013-10-14 DIAGNOSIS — M069 Rheumatoid arthritis, unspecified: Secondary | ICD-10-CM | POA: Diagnosis not present

## 2013-10-14 DIAGNOSIS — M1710 Unilateral primary osteoarthritis, unspecified knee: Secondary | ICD-10-CM | POA: Diagnosis not present

## 2013-10-14 DIAGNOSIS — IMO0002 Reserved for concepts with insufficient information to code with codable children: Secondary | ICD-10-CM | POA: Diagnosis not present

## 2013-10-14 DIAGNOSIS — M25569 Pain in unspecified knee: Secondary | ICD-10-CM | POA: Diagnosis not present

## 2013-10-22 DIAGNOSIS — M1710 Unilateral primary osteoarthritis, unspecified knee: Secondary | ICD-10-CM | POA: Diagnosis not present

## 2013-10-22 DIAGNOSIS — M159 Polyosteoarthritis, unspecified: Secondary | ICD-10-CM | POA: Diagnosis not present

## 2013-10-22 DIAGNOSIS — M069 Rheumatoid arthritis, unspecified: Secondary | ICD-10-CM | POA: Diagnosis not present

## 2013-10-22 DIAGNOSIS — IMO0002 Reserved for concepts with insufficient information to code with codable children: Secondary | ICD-10-CM | POA: Diagnosis not present

## 2013-10-28 DIAGNOSIS — M159 Polyosteoarthritis, unspecified: Secondary | ICD-10-CM | POA: Diagnosis not present

## 2013-10-28 DIAGNOSIS — IMO0002 Reserved for concepts with insufficient information to code with codable children: Secondary | ICD-10-CM | POA: Diagnosis not present

## 2013-10-28 DIAGNOSIS — M069 Rheumatoid arthritis, unspecified: Secondary | ICD-10-CM | POA: Diagnosis not present

## 2013-10-28 DIAGNOSIS — M1710 Unilateral primary osteoarthritis, unspecified knee: Secondary | ICD-10-CM | POA: Diagnosis not present

## 2013-11-06 DIAGNOSIS — M542 Cervicalgia: Secondary | ICD-10-CM | POA: Diagnosis not present

## 2013-11-06 DIAGNOSIS — Z981 Arthrodesis status: Secondary | ICD-10-CM | POA: Diagnosis not present

## 2013-11-11 DIAGNOSIS — F341 Dysthymic disorder: Secondary | ICD-10-CM | POA: Diagnosis not present

## 2013-11-11 DIAGNOSIS — F3289 Other specified depressive episodes: Secondary | ICD-10-CM | POA: Diagnosis not present

## 2013-11-11 DIAGNOSIS — K219 Gastro-esophageal reflux disease without esophagitis: Secondary | ICD-10-CM | POA: Diagnosis not present

## 2013-11-11 DIAGNOSIS — F458 Other somatoform disorders: Secondary | ICD-10-CM | POA: Diagnosis not present

## 2013-11-11 DIAGNOSIS — M069 Rheumatoid arthritis, unspecified: Secondary | ICD-10-CM | POA: Diagnosis not present

## 2013-11-11 DIAGNOSIS — F329 Major depressive disorder, single episode, unspecified: Secondary | ICD-10-CM | POA: Diagnosis not present

## 2013-11-11 DIAGNOSIS — F411 Generalized anxiety disorder: Secondary | ICD-10-CM | POA: Diagnosis not present

## 2013-11-11 DIAGNOSIS — G20 Parkinson's disease: Secondary | ICD-10-CM | POA: Diagnosis not present

## 2013-11-11 DIAGNOSIS — E039 Hypothyroidism, unspecified: Secondary | ICD-10-CM | POA: Diagnosis not present

## 2013-11-11 DIAGNOSIS — Z0000 Encounter for general adult medical examination without abnormal findings: Secondary | ICD-10-CM | POA: Diagnosis not present

## 2013-11-11 DIAGNOSIS — R490 Dysphonia: Secondary | ICD-10-CM | POA: Diagnosis not present

## 2013-11-11 DIAGNOSIS — E0789 Other specified disorders of thyroid: Secondary | ICD-10-CM | POA: Diagnosis not present

## 2013-11-20 DIAGNOSIS — M542 Cervicalgia: Secondary | ICD-10-CM | POA: Diagnosis not present

## 2013-11-20 DIAGNOSIS — M47812 Spondylosis without myelopathy or radiculopathy, cervical region: Secondary | ICD-10-CM | POA: Diagnosis not present

## 2013-11-20 DIAGNOSIS — G894 Chronic pain syndrome: Secondary | ICD-10-CM | POA: Diagnosis not present

## 2013-12-01 DIAGNOSIS — M47812 Spondylosis without myelopathy or radiculopathy, cervical region: Secondary | ICD-10-CM | POA: Diagnosis not present

## 2013-12-01 DIAGNOSIS — M542 Cervicalgia: Secondary | ICD-10-CM | POA: Diagnosis not present

## 2013-12-22 DIAGNOSIS — Z1231 Encounter for screening mammogram for malignant neoplasm of breast: Secondary | ICD-10-CM | POA: Diagnosis not present

## 2014-01-29 DIAGNOSIS — Z462 Encounter for fitting and adjustment of other devices related to nervous system and special senses: Secondary | ICD-10-CM | POA: Diagnosis not present

## 2014-01-29 DIAGNOSIS — G20 Parkinson's disease: Secondary | ICD-10-CM | POA: Diagnosis not present

## 2014-02-05 DIAGNOSIS — IMO0002 Reserved for concepts with insufficient information to code with codable children: Secondary | ICD-10-CM | POA: Diagnosis not present

## 2014-02-05 DIAGNOSIS — M48062 Spinal stenosis, lumbar region with neurogenic claudication: Secondary | ICD-10-CM | POA: Diagnosis not present

## 2014-02-11 DIAGNOSIS — M461 Sacroiliitis, not elsewhere classified: Secondary | ICD-10-CM | POA: Diagnosis not present

## 2014-03-19 DIAGNOSIS — M25469 Effusion, unspecified knee: Secondary | ICD-10-CM | POA: Diagnosis not present

## 2014-03-19 DIAGNOSIS — M545 Low back pain, unspecified: Secondary | ICD-10-CM | POA: Diagnosis not present

## 2014-03-19 DIAGNOSIS — M25569 Pain in unspecified knee: Secondary | ICD-10-CM | POA: Diagnosis not present

## 2014-03-19 DIAGNOSIS — IMO0002 Reserved for concepts with insufficient information to code with codable children: Secondary | ICD-10-CM | POA: Diagnosis not present

## 2014-03-19 DIAGNOSIS — M1710 Unilateral primary osteoarthritis, unspecified knee: Secondary | ICD-10-CM | POA: Diagnosis not present

## 2014-03-19 DIAGNOSIS — M2340 Loose body in knee, unspecified knee: Secondary | ICD-10-CM | POA: Diagnosis not present

## 2014-04-13 DIAGNOSIS — M25569 Pain in unspecified knee: Secondary | ICD-10-CM | POA: Diagnosis not present

## 2014-04-22 DIAGNOSIS — M25569 Pain in unspecified knee: Secondary | ICD-10-CM | POA: Diagnosis not present

## 2014-04-27 DIAGNOSIS — R609 Edema, unspecified: Secondary | ICD-10-CM | POA: Diagnosis not present

## 2014-04-29 DIAGNOSIS — M25559 Pain in unspecified hip: Secondary | ICD-10-CM | POA: Diagnosis not present

## 2014-04-29 DIAGNOSIS — M25569 Pain in unspecified knee: Secondary | ICD-10-CM | POA: Diagnosis not present

## 2014-04-29 DIAGNOSIS — M1710 Unilateral primary osteoarthritis, unspecified knee: Secondary | ICD-10-CM | POA: Diagnosis not present

## 2014-05-06 DIAGNOSIS — M25569 Pain in unspecified knee: Secondary | ICD-10-CM | POA: Diagnosis not present

## 2014-05-06 DIAGNOSIS — M48061 Spinal stenosis, lumbar region without neurogenic claudication: Secondary | ICD-10-CM | POA: Diagnosis not present

## 2014-05-12 DIAGNOSIS — M5430 Sciatica, unspecified side: Secondary | ICD-10-CM | POA: Diagnosis not present

## 2014-05-28 DIAGNOSIS — M25569 Pain in unspecified knee: Secondary | ICD-10-CM | POA: Diagnosis not present

## 2014-05-28 DIAGNOSIS — IMO0002 Reserved for concepts with insufficient information to code with codable children: Secondary | ICD-10-CM | POA: Diagnosis not present

## 2014-05-28 DIAGNOSIS — Z23 Encounter for immunization: Secondary | ICD-10-CM | POA: Diagnosis not present

## 2014-06-16 DIAGNOSIS — IMO0002 Reserved for concepts with insufficient information to code with codable children: Secondary | ICD-10-CM | POA: Diagnosis not present

## 2014-07-09 DIAGNOSIS — G20 Parkinson's disease: Secondary | ICD-10-CM | POA: Diagnosis not present

## 2014-07-13 DIAGNOSIS — M5416 Radiculopathy, lumbar region: Secondary | ICD-10-CM | POA: Diagnosis not present

## 2014-07-13 DIAGNOSIS — M5417 Radiculopathy, lumbosacral region: Secondary | ICD-10-CM | POA: Diagnosis not present

## 2014-07-15 DIAGNOSIS — M2341 Loose body in knee, right knee: Secondary | ICD-10-CM | POA: Diagnosis not present

## 2014-07-15 DIAGNOSIS — G20 Parkinson's disease: Secondary | ICD-10-CM | POA: Diagnosis not present

## 2014-07-15 DIAGNOSIS — M5137 Other intervertebral disc degeneration, lumbosacral region: Secondary | ICD-10-CM | POA: Diagnosis not present

## 2014-07-15 DIAGNOSIS — M1712 Unilateral primary osteoarthritis, left knee: Secondary | ICD-10-CM | POA: Diagnosis not present

## 2014-07-15 DIAGNOSIS — M25462 Effusion, left knee: Secondary | ICD-10-CM | POA: Diagnosis not present

## 2014-07-30 DIAGNOSIS — M1712 Unilateral primary osteoarthritis, left knee: Secondary | ICD-10-CM | POA: Diagnosis not present

## 2014-07-30 DIAGNOSIS — R7989 Other specified abnormal findings of blood chemistry: Secondary | ICD-10-CM | POA: Diagnosis not present

## 2014-08-11 DIAGNOSIS — R262 Difficulty in walking, not elsewhere classified: Secondary | ICD-10-CM | POA: Diagnosis not present

## 2014-08-11 DIAGNOSIS — R2689 Other abnormalities of gait and mobility: Secondary | ICD-10-CM | POA: Diagnosis not present

## 2014-08-11 DIAGNOSIS — R6889 Other general symptoms and signs: Secondary | ICD-10-CM | POA: Diagnosis not present

## 2014-08-11 DIAGNOSIS — M1712 Unilateral primary osteoarthritis, left knee: Secondary | ICD-10-CM | POA: Diagnosis not present

## 2014-08-21 DIAGNOSIS — M069 Rheumatoid arthritis, unspecified: Secondary | ICD-10-CM | POA: Diagnosis present

## 2014-08-21 DIAGNOSIS — M1712 Unilateral primary osteoarthritis, left knee: Secondary | ICD-10-CM | POA: Diagnosis present

## 2014-08-21 DIAGNOSIS — D62 Acute posthemorrhagic anemia: Secondary | ICD-10-CM | POA: Diagnosis not present

## 2014-08-21 DIAGNOSIS — G20 Parkinson's disease: Secondary | ICD-10-CM | POA: Diagnosis present

## 2014-08-21 DIAGNOSIS — F329 Major depressive disorder, single episode, unspecified: Secondary | ICD-10-CM | POA: Diagnosis present

## 2014-08-21 DIAGNOSIS — K219 Gastro-esophageal reflux disease without esophagitis: Secondary | ICD-10-CM | POA: Diagnosis present

## 2014-08-21 DIAGNOSIS — Z471 Aftercare following joint replacement surgery: Secondary | ICD-10-CM | POA: Diagnosis not present

## 2014-08-21 DIAGNOSIS — F419 Anxiety disorder, unspecified: Secondary | ICD-10-CM | POA: Diagnosis present

## 2014-08-21 DIAGNOSIS — G8918 Other acute postprocedural pain: Secondary | ICD-10-CM | POA: Diagnosis not present

## 2014-08-21 DIAGNOSIS — E669 Obesity, unspecified: Secondary | ICD-10-CM | POA: Diagnosis present

## 2014-08-21 DIAGNOSIS — Z96652 Presence of left artificial knee joint: Secondary | ICD-10-CM | POA: Diagnosis not present

## 2014-08-21 DIAGNOSIS — E785 Hyperlipidemia, unspecified: Secondary | ICD-10-CM | POA: Diagnosis present

## 2014-08-21 DIAGNOSIS — E039 Hypothyroidism, unspecified: Secondary | ICD-10-CM | POA: Diagnosis present

## 2014-08-21 DIAGNOSIS — Z6833 Body mass index (BMI) 33.0-33.9, adult: Secondary | ICD-10-CM | POA: Diagnosis not present

## 2014-08-21 DIAGNOSIS — M25562 Pain in left knee: Secondary | ICD-10-CM | POA: Diagnosis not present

## 2014-08-21 DIAGNOSIS — K5900 Constipation, unspecified: Secondary | ICD-10-CM | POA: Diagnosis not present

## 2014-08-26 DIAGNOSIS — Z9181 History of falling: Secondary | ICD-10-CM | POA: Diagnosis not present

## 2014-08-26 DIAGNOSIS — Z471 Aftercare following joint replacement surgery: Secondary | ICD-10-CM | POA: Diagnosis not present

## 2014-08-26 DIAGNOSIS — Z96652 Presence of left artificial knee joint: Secondary | ICD-10-CM | POA: Diagnosis not present

## 2014-08-26 DIAGNOSIS — F341 Dysthymic disorder: Secondary | ICD-10-CM | POA: Diagnosis not present

## 2014-08-26 DIAGNOSIS — G20 Parkinson's disease: Secondary | ICD-10-CM | POA: Diagnosis not present

## 2014-08-26 DIAGNOSIS — M069 Rheumatoid arthritis, unspecified: Secondary | ICD-10-CM | POA: Diagnosis not present

## 2014-08-28 DIAGNOSIS — Z471 Aftercare following joint replacement surgery: Secondary | ICD-10-CM | POA: Diagnosis not present

## 2014-08-28 DIAGNOSIS — G20 Parkinson's disease: Secondary | ICD-10-CM | POA: Diagnosis not present

## 2014-08-28 DIAGNOSIS — M069 Rheumatoid arthritis, unspecified: Secondary | ICD-10-CM | POA: Diagnosis not present

## 2014-08-28 DIAGNOSIS — Z96652 Presence of left artificial knee joint: Secondary | ICD-10-CM | POA: Diagnosis not present

## 2014-08-28 DIAGNOSIS — Z9181 History of falling: Secondary | ICD-10-CM | POA: Diagnosis not present

## 2014-08-28 DIAGNOSIS — F341 Dysthymic disorder: Secondary | ICD-10-CM | POA: Diagnosis not present

## 2014-08-31 DIAGNOSIS — Z9181 History of falling: Secondary | ICD-10-CM | POA: Diagnosis not present

## 2014-08-31 DIAGNOSIS — G20 Parkinson's disease: Secondary | ICD-10-CM | POA: Diagnosis not present

## 2014-08-31 DIAGNOSIS — M069 Rheumatoid arthritis, unspecified: Secondary | ICD-10-CM | POA: Diagnosis not present

## 2014-08-31 DIAGNOSIS — Z471 Aftercare following joint replacement surgery: Secondary | ICD-10-CM | POA: Diagnosis not present

## 2014-08-31 DIAGNOSIS — F341 Dysthymic disorder: Secondary | ICD-10-CM | POA: Diagnosis not present

## 2014-08-31 DIAGNOSIS — Z96652 Presence of left artificial knee joint: Secondary | ICD-10-CM | POA: Diagnosis not present

## 2014-09-01 DIAGNOSIS — M069 Rheumatoid arthritis, unspecified: Secondary | ICD-10-CM | POA: Diagnosis not present

## 2014-09-01 DIAGNOSIS — G20 Parkinson's disease: Secondary | ICD-10-CM | POA: Diagnosis not present

## 2014-09-01 DIAGNOSIS — Z9181 History of falling: Secondary | ICD-10-CM | POA: Diagnosis not present

## 2014-09-01 DIAGNOSIS — Z96652 Presence of left artificial knee joint: Secondary | ICD-10-CM | POA: Diagnosis not present

## 2014-09-01 DIAGNOSIS — F341 Dysthymic disorder: Secondary | ICD-10-CM | POA: Diagnosis not present

## 2014-09-01 DIAGNOSIS — Z471 Aftercare following joint replacement surgery: Secondary | ICD-10-CM | POA: Diagnosis not present

## 2014-09-02 DIAGNOSIS — F341 Dysthymic disorder: Secondary | ICD-10-CM | POA: Diagnosis not present

## 2014-09-02 DIAGNOSIS — M069 Rheumatoid arthritis, unspecified: Secondary | ICD-10-CM | POA: Diagnosis not present

## 2014-09-02 DIAGNOSIS — Z96652 Presence of left artificial knee joint: Secondary | ICD-10-CM | POA: Diagnosis not present

## 2014-09-02 DIAGNOSIS — Z471 Aftercare following joint replacement surgery: Secondary | ICD-10-CM | POA: Diagnosis not present

## 2014-09-02 DIAGNOSIS — G20 Parkinson's disease: Secondary | ICD-10-CM | POA: Diagnosis not present

## 2014-09-02 DIAGNOSIS — Z9181 History of falling: Secondary | ICD-10-CM | POA: Diagnosis not present

## 2014-09-03 DIAGNOSIS — Z96652 Presence of left artificial knee joint: Secondary | ICD-10-CM | POA: Diagnosis not present

## 2014-09-03 DIAGNOSIS — Z471 Aftercare following joint replacement surgery: Secondary | ICD-10-CM | POA: Diagnosis not present

## 2014-09-04 DIAGNOSIS — M069 Rheumatoid arthritis, unspecified: Secondary | ICD-10-CM | POA: Diagnosis not present

## 2014-09-04 DIAGNOSIS — G20 Parkinson's disease: Secondary | ICD-10-CM | POA: Diagnosis not present

## 2014-09-04 DIAGNOSIS — Z9181 History of falling: Secondary | ICD-10-CM | POA: Diagnosis not present

## 2014-09-04 DIAGNOSIS — F341 Dysthymic disorder: Secondary | ICD-10-CM | POA: Diagnosis not present

## 2014-09-04 DIAGNOSIS — Z471 Aftercare following joint replacement surgery: Secondary | ICD-10-CM | POA: Diagnosis not present

## 2014-09-04 DIAGNOSIS — Z96652 Presence of left artificial knee joint: Secondary | ICD-10-CM | POA: Diagnosis not present

## 2014-09-07 DIAGNOSIS — K219 Gastro-esophageal reflux disease without esophagitis: Secondary | ICD-10-CM | POA: Diagnosis not present

## 2014-09-07 DIAGNOSIS — M069 Rheumatoid arthritis, unspecified: Secondary | ICD-10-CM | POA: Diagnosis not present

## 2014-09-07 DIAGNOSIS — S76912A Strain of unspecified muscles, fascia and tendons at thigh level, left thigh, initial encounter: Secondary | ICD-10-CM | POA: Diagnosis not present

## 2014-09-07 DIAGNOSIS — F341 Dysthymic disorder: Secondary | ICD-10-CM | POA: Diagnosis not present

## 2014-09-07 DIAGNOSIS — Z96652 Presence of left artificial knee joint: Secondary | ICD-10-CM | POA: Diagnosis not present

## 2014-09-07 DIAGNOSIS — M79605 Pain in left leg: Secondary | ICD-10-CM | POA: Diagnosis not present

## 2014-09-07 DIAGNOSIS — M25469 Effusion, unspecified knee: Secondary | ICD-10-CM | POA: Diagnosis not present

## 2014-09-07 DIAGNOSIS — S82452A Displaced comminuted fracture of shaft of left fibula, initial encounter for closed fracture: Secondary | ICD-10-CM | POA: Diagnosis not present

## 2014-09-07 DIAGNOSIS — S76112A Strain of left quadriceps muscle, fascia and tendon, initial encounter: Secondary | ICD-10-CM | POA: Diagnosis not present

## 2014-09-07 DIAGNOSIS — E785 Hyperlipidemia, unspecified: Secondary | ICD-10-CM | POA: Diagnosis not present

## 2014-09-07 DIAGNOSIS — Z886 Allergy status to analgesic agent status: Secondary | ICD-10-CM | POA: Diagnosis not present

## 2014-09-07 DIAGNOSIS — S82402D Unspecified fracture of shaft of left fibula, subsequent encounter for closed fracture with routine healing: Secondary | ICD-10-CM | POA: Diagnosis not present

## 2014-09-07 DIAGNOSIS — Z7982 Long term (current) use of aspirin: Secondary | ICD-10-CM | POA: Diagnosis not present

## 2014-09-07 DIAGNOSIS — Z471 Aftercare following joint replacement surgery: Secondary | ICD-10-CM | POA: Diagnosis not present

## 2014-09-07 DIAGNOSIS — M1612 Unilateral primary osteoarthritis, left hip: Secondary | ICD-10-CM | POA: Diagnosis not present

## 2014-09-07 DIAGNOSIS — G20 Parkinson's disease: Secondary | ICD-10-CM | POA: Diagnosis not present

## 2014-09-07 DIAGNOSIS — Z9181 History of falling: Secondary | ICD-10-CM | POA: Diagnosis not present

## 2014-09-07 DIAGNOSIS — Z91040 Latex allergy status: Secondary | ICD-10-CM | POA: Diagnosis not present

## 2014-09-09 DIAGNOSIS — Z96652 Presence of left artificial knee joint: Secondary | ICD-10-CM | POA: Diagnosis not present

## 2014-09-09 DIAGNOSIS — F341 Dysthymic disorder: Secondary | ICD-10-CM | POA: Diagnosis not present

## 2014-09-09 DIAGNOSIS — M069 Rheumatoid arthritis, unspecified: Secondary | ICD-10-CM | POA: Diagnosis not present

## 2014-09-09 DIAGNOSIS — Z471 Aftercare following joint replacement surgery: Secondary | ICD-10-CM | POA: Diagnosis not present

## 2014-09-09 DIAGNOSIS — Z9181 History of falling: Secondary | ICD-10-CM | POA: Diagnosis not present

## 2014-09-09 DIAGNOSIS — G20 Parkinson's disease: Secondary | ICD-10-CM | POA: Diagnosis not present

## 2014-09-10 DIAGNOSIS — F341 Dysthymic disorder: Secondary | ICD-10-CM | POA: Diagnosis not present

## 2014-09-10 DIAGNOSIS — Z96652 Presence of left artificial knee joint: Secondary | ICD-10-CM | POA: Diagnosis not present

## 2014-09-10 DIAGNOSIS — Z9181 History of falling: Secondary | ICD-10-CM | POA: Diagnosis not present

## 2014-09-10 DIAGNOSIS — G20 Parkinson's disease: Secondary | ICD-10-CM | POA: Diagnosis not present

## 2014-09-10 DIAGNOSIS — M069 Rheumatoid arthritis, unspecified: Secondary | ICD-10-CM | POA: Diagnosis not present

## 2014-09-10 DIAGNOSIS — Z471 Aftercare following joint replacement surgery: Secondary | ICD-10-CM | POA: Diagnosis not present

## 2014-09-14 DIAGNOSIS — Z96652 Presence of left artificial knee joint: Secondary | ICD-10-CM | POA: Diagnosis not present

## 2014-09-14 DIAGNOSIS — M069 Rheumatoid arthritis, unspecified: Secondary | ICD-10-CM | POA: Diagnosis not present

## 2014-09-14 DIAGNOSIS — F341 Dysthymic disorder: Secondary | ICD-10-CM | POA: Diagnosis not present

## 2014-09-14 DIAGNOSIS — Z9181 History of falling: Secondary | ICD-10-CM | POA: Diagnosis not present

## 2014-09-14 DIAGNOSIS — G20 Parkinson's disease: Secondary | ICD-10-CM | POA: Diagnosis not present

## 2014-09-14 DIAGNOSIS — Z471 Aftercare following joint replacement surgery: Secondary | ICD-10-CM | POA: Diagnosis not present

## 2014-09-15 DIAGNOSIS — Z471 Aftercare following joint replacement surgery: Secondary | ICD-10-CM | POA: Diagnosis not present

## 2014-09-15 DIAGNOSIS — Z9181 History of falling: Secondary | ICD-10-CM | POA: Diagnosis not present

## 2014-09-15 DIAGNOSIS — M069 Rheumatoid arthritis, unspecified: Secondary | ICD-10-CM | POA: Diagnosis not present

## 2014-09-15 DIAGNOSIS — F341 Dysthymic disorder: Secondary | ICD-10-CM | POA: Diagnosis not present

## 2014-09-15 DIAGNOSIS — Z96652 Presence of left artificial knee joint: Secondary | ICD-10-CM | POA: Diagnosis not present

## 2014-09-15 DIAGNOSIS — G20 Parkinson's disease: Secondary | ICD-10-CM | POA: Diagnosis not present

## 2014-09-16 DIAGNOSIS — Z96652 Presence of left artificial knee joint: Secondary | ICD-10-CM | POA: Diagnosis not present

## 2014-09-16 DIAGNOSIS — Z471 Aftercare following joint replacement surgery: Secondary | ICD-10-CM | POA: Diagnosis not present

## 2014-09-16 DIAGNOSIS — Z9181 History of falling: Secondary | ICD-10-CM | POA: Diagnosis not present

## 2014-09-16 DIAGNOSIS — F341 Dysthymic disorder: Secondary | ICD-10-CM | POA: Diagnosis not present

## 2014-09-16 DIAGNOSIS — G20 Parkinson's disease: Secondary | ICD-10-CM | POA: Diagnosis not present

## 2014-09-16 DIAGNOSIS — M069 Rheumatoid arthritis, unspecified: Secondary | ICD-10-CM | POA: Diagnosis not present

## 2014-09-17 DIAGNOSIS — F341 Dysthymic disorder: Secondary | ICD-10-CM | POA: Diagnosis not present

## 2014-09-17 DIAGNOSIS — Z471 Aftercare following joint replacement surgery: Secondary | ICD-10-CM | POA: Diagnosis not present

## 2014-09-17 DIAGNOSIS — Z96652 Presence of left artificial knee joint: Secondary | ICD-10-CM | POA: Diagnosis not present

## 2014-09-17 DIAGNOSIS — Z9181 History of falling: Secondary | ICD-10-CM | POA: Diagnosis not present

## 2014-09-17 DIAGNOSIS — G20 Parkinson's disease: Secondary | ICD-10-CM | POA: Diagnosis not present

## 2014-09-17 DIAGNOSIS — M069 Rheumatoid arthritis, unspecified: Secondary | ICD-10-CM | POA: Diagnosis not present

## 2014-09-21 DIAGNOSIS — Z9181 History of falling: Secondary | ICD-10-CM | POA: Diagnosis not present

## 2014-09-21 DIAGNOSIS — F341 Dysthymic disorder: Secondary | ICD-10-CM | POA: Diagnosis not present

## 2014-09-21 DIAGNOSIS — Z96652 Presence of left artificial knee joint: Secondary | ICD-10-CM | POA: Diagnosis not present

## 2014-09-21 DIAGNOSIS — Z471 Aftercare following joint replacement surgery: Secondary | ICD-10-CM | POA: Diagnosis not present

## 2014-09-21 DIAGNOSIS — M069 Rheumatoid arthritis, unspecified: Secondary | ICD-10-CM | POA: Diagnosis not present

## 2014-09-21 DIAGNOSIS — G20 Parkinson's disease: Secondary | ICD-10-CM | POA: Diagnosis not present

## 2014-09-22 DIAGNOSIS — Z85828 Personal history of other malignant neoplasm of skin: Secondary | ICD-10-CM | POA: Diagnosis not present

## 2014-09-22 DIAGNOSIS — G20 Parkinson's disease: Secondary | ICD-10-CM | POA: Diagnosis not present

## 2014-09-22 DIAGNOSIS — E785 Hyperlipidemia, unspecified: Secondary | ICD-10-CM | POA: Diagnosis not present

## 2014-09-22 DIAGNOSIS — Z9689 Presence of other specified functional implants: Secondary | ICD-10-CM | POA: Diagnosis not present

## 2014-09-23 DIAGNOSIS — F341 Dysthymic disorder: Secondary | ICD-10-CM | POA: Diagnosis not present

## 2014-09-23 DIAGNOSIS — G20 Parkinson's disease: Secondary | ICD-10-CM | POA: Diagnosis not present

## 2014-09-23 DIAGNOSIS — M069 Rheumatoid arthritis, unspecified: Secondary | ICD-10-CM | POA: Diagnosis not present

## 2014-09-23 DIAGNOSIS — Z471 Aftercare following joint replacement surgery: Secondary | ICD-10-CM | POA: Diagnosis not present

## 2014-09-23 DIAGNOSIS — Z9181 History of falling: Secondary | ICD-10-CM | POA: Diagnosis not present

## 2014-09-23 DIAGNOSIS — Z96652 Presence of left artificial knee joint: Secondary | ICD-10-CM | POA: Diagnosis not present

## 2014-09-25 DIAGNOSIS — F341 Dysthymic disorder: Secondary | ICD-10-CM | POA: Diagnosis not present

## 2014-09-25 DIAGNOSIS — Z471 Aftercare following joint replacement surgery: Secondary | ICD-10-CM | POA: Diagnosis not present

## 2014-09-25 DIAGNOSIS — Z9181 History of falling: Secondary | ICD-10-CM | POA: Diagnosis not present

## 2014-09-25 DIAGNOSIS — Z96652 Presence of left artificial knee joint: Secondary | ICD-10-CM | POA: Diagnosis not present

## 2014-09-25 DIAGNOSIS — G20 Parkinson's disease: Secondary | ICD-10-CM | POA: Diagnosis not present

## 2014-09-25 DIAGNOSIS — M069 Rheumatoid arthritis, unspecified: Secondary | ICD-10-CM | POA: Diagnosis not present

## 2014-09-29 DIAGNOSIS — G20 Parkinson's disease: Secondary | ICD-10-CM | POA: Diagnosis not present

## 2014-09-29 DIAGNOSIS — Z471 Aftercare following joint replacement surgery: Secondary | ICD-10-CM | POA: Diagnosis not present

## 2014-09-29 DIAGNOSIS — F341 Dysthymic disorder: Secondary | ICD-10-CM | POA: Diagnosis not present

## 2014-09-29 DIAGNOSIS — M069 Rheumatoid arthritis, unspecified: Secondary | ICD-10-CM | POA: Diagnosis not present

## 2014-09-29 DIAGNOSIS — Z96652 Presence of left artificial knee joint: Secondary | ICD-10-CM | POA: Diagnosis not present

## 2014-09-29 DIAGNOSIS — Z9181 History of falling: Secondary | ICD-10-CM | POA: Diagnosis not present

## 2014-09-30 DIAGNOSIS — Z96652 Presence of left artificial knee joint: Secondary | ICD-10-CM | POA: Diagnosis not present

## 2014-09-30 DIAGNOSIS — G20 Parkinson's disease: Secondary | ICD-10-CM | POA: Diagnosis not present

## 2014-09-30 DIAGNOSIS — M069 Rheumatoid arthritis, unspecified: Secondary | ICD-10-CM | POA: Diagnosis not present

## 2014-09-30 DIAGNOSIS — Z9181 History of falling: Secondary | ICD-10-CM | POA: Diagnosis not present

## 2014-09-30 DIAGNOSIS — Z471 Aftercare following joint replacement surgery: Secondary | ICD-10-CM | POA: Diagnosis not present

## 2014-09-30 DIAGNOSIS — F341 Dysthymic disorder: Secondary | ICD-10-CM | POA: Diagnosis not present

## 2014-10-01 DIAGNOSIS — Z471 Aftercare following joint replacement surgery: Secondary | ICD-10-CM | POA: Diagnosis not present

## 2014-10-01 DIAGNOSIS — M25462 Effusion, left knee: Secondary | ICD-10-CM | POA: Diagnosis not present

## 2014-10-01 DIAGNOSIS — Z9660 Presence of unspecified orthopedic joint implant: Secondary | ICD-10-CM | POA: Diagnosis not present

## 2014-10-01 DIAGNOSIS — Z96652 Presence of left artificial knee joint: Secondary | ICD-10-CM | POA: Diagnosis not present

## 2014-10-02 DIAGNOSIS — M069 Rheumatoid arthritis, unspecified: Secondary | ICD-10-CM | POA: Diagnosis not present

## 2014-10-02 DIAGNOSIS — Z9181 History of falling: Secondary | ICD-10-CM | POA: Diagnosis not present

## 2014-10-02 DIAGNOSIS — Z471 Aftercare following joint replacement surgery: Secondary | ICD-10-CM | POA: Diagnosis not present

## 2014-10-02 DIAGNOSIS — F341 Dysthymic disorder: Secondary | ICD-10-CM | POA: Diagnosis not present

## 2014-10-02 DIAGNOSIS — G20 Parkinson's disease: Secondary | ICD-10-CM | POA: Diagnosis not present

## 2014-10-02 DIAGNOSIS — Z96652 Presence of left artificial knee joint: Secondary | ICD-10-CM | POA: Diagnosis not present

## 2014-10-05 DIAGNOSIS — F341 Dysthymic disorder: Secondary | ICD-10-CM | POA: Diagnosis not present

## 2014-10-05 DIAGNOSIS — Z471 Aftercare following joint replacement surgery: Secondary | ICD-10-CM | POA: Diagnosis not present

## 2014-10-05 DIAGNOSIS — M069 Rheumatoid arthritis, unspecified: Secondary | ICD-10-CM | POA: Diagnosis not present

## 2014-10-05 DIAGNOSIS — Z9181 History of falling: Secondary | ICD-10-CM | POA: Diagnosis not present

## 2014-10-05 DIAGNOSIS — Z96652 Presence of left artificial knee joint: Secondary | ICD-10-CM | POA: Diagnosis not present

## 2014-10-05 DIAGNOSIS — G20 Parkinson's disease: Secondary | ICD-10-CM | POA: Diagnosis not present

## 2014-10-07 DIAGNOSIS — F341 Dysthymic disorder: Secondary | ICD-10-CM | POA: Diagnosis not present

## 2014-10-07 DIAGNOSIS — M069 Rheumatoid arthritis, unspecified: Secondary | ICD-10-CM | POA: Diagnosis not present

## 2014-10-07 DIAGNOSIS — Z471 Aftercare following joint replacement surgery: Secondary | ICD-10-CM | POA: Diagnosis not present

## 2014-10-07 DIAGNOSIS — Z9181 History of falling: Secondary | ICD-10-CM | POA: Diagnosis not present

## 2014-10-07 DIAGNOSIS — Z96652 Presence of left artificial knee joint: Secondary | ICD-10-CM | POA: Diagnosis not present

## 2014-10-07 DIAGNOSIS — G20 Parkinson's disease: Secondary | ICD-10-CM | POA: Diagnosis not present

## 2014-10-21 DIAGNOSIS — Z8719 Personal history of other diseases of the digestive system: Secondary | ICD-10-CM | POA: Diagnosis not present

## 2014-10-26 DIAGNOSIS — R531 Weakness: Secondary | ICD-10-CM | POA: Diagnosis not present

## 2014-10-26 DIAGNOSIS — R2689 Other abnormalities of gait and mobility: Secondary | ICD-10-CM | POA: Diagnosis not present

## 2014-10-26 DIAGNOSIS — R6889 Other general symptoms and signs: Secondary | ICD-10-CM | POA: Diagnosis not present

## 2014-10-26 DIAGNOSIS — M1712 Unilateral primary osteoarthritis, left knee: Secondary | ICD-10-CM | POA: Diagnosis not present

## 2014-10-29 DIAGNOSIS — R2689 Other abnormalities of gait and mobility: Secondary | ICD-10-CM | POA: Diagnosis not present

## 2014-10-29 DIAGNOSIS — R6889 Other general symptoms and signs: Secondary | ICD-10-CM | POA: Diagnosis not present

## 2014-10-29 DIAGNOSIS — M1712 Unilateral primary osteoarthritis, left knee: Secondary | ICD-10-CM | POA: Diagnosis not present

## 2014-10-29 DIAGNOSIS — R531 Weakness: Secondary | ICD-10-CM | POA: Diagnosis not present

## 2014-11-03 DIAGNOSIS — R531 Weakness: Secondary | ICD-10-CM | POA: Diagnosis not present

## 2014-11-03 DIAGNOSIS — R2689 Other abnormalities of gait and mobility: Secondary | ICD-10-CM | POA: Diagnosis not present

## 2014-11-03 DIAGNOSIS — M1712 Unilateral primary osteoarthritis, left knee: Secondary | ICD-10-CM | POA: Diagnosis not present

## 2014-11-03 DIAGNOSIS — R6889 Other general symptoms and signs: Secondary | ICD-10-CM | POA: Diagnosis not present

## 2014-11-05 DIAGNOSIS — R6889 Other general symptoms and signs: Secondary | ICD-10-CM | POA: Diagnosis not present

## 2014-11-05 DIAGNOSIS — R2689 Other abnormalities of gait and mobility: Secondary | ICD-10-CM | POA: Diagnosis not present

## 2014-11-05 DIAGNOSIS — M1712 Unilateral primary osteoarthritis, left knee: Secondary | ICD-10-CM | POA: Diagnosis not present

## 2014-11-05 DIAGNOSIS — R531 Weakness: Secondary | ICD-10-CM | POA: Diagnosis not present

## 2014-11-12 DIAGNOSIS — R6889 Other general symptoms and signs: Secondary | ICD-10-CM | POA: Diagnosis not present

## 2014-11-12 DIAGNOSIS — M1712 Unilateral primary osteoarthritis, left knee: Secondary | ICD-10-CM | POA: Diagnosis not present

## 2014-11-12 DIAGNOSIS — R2689 Other abnormalities of gait and mobility: Secondary | ICD-10-CM | POA: Diagnosis not present

## 2014-11-12 DIAGNOSIS — R531 Weakness: Secondary | ICD-10-CM | POA: Diagnosis not present

## 2014-11-13 DIAGNOSIS — G20 Parkinson's disease: Secondary | ICD-10-CM | POA: Diagnosis not present

## 2014-11-13 DIAGNOSIS — Z96652 Presence of left artificial knee joint: Secondary | ICD-10-CM | POA: Diagnosis not present

## 2014-11-13 DIAGNOSIS — S82402A Unspecified fracture of shaft of left fibula, initial encounter for closed fracture: Secondary | ICD-10-CM | POA: Diagnosis not present

## 2014-11-13 DIAGNOSIS — Z9689 Presence of other specified functional implants: Secondary | ICD-10-CM | POA: Diagnosis not present

## 2014-11-13 DIAGNOSIS — Z471 Aftercare following joint replacement surgery: Secondary | ICD-10-CM | POA: Diagnosis not present

## 2014-12-03 DIAGNOSIS — E785 Hyperlipidemia, unspecified: Secondary | ICD-10-CM | POA: Diagnosis not present

## 2014-12-03 DIAGNOSIS — Z96652 Presence of left artificial knee joint: Secondary | ICD-10-CM | POA: Diagnosis not present

## 2014-12-03 DIAGNOSIS — K225 Diverticulum of esophagus, acquired: Secondary | ICD-10-CM | POA: Diagnosis not present

## 2014-12-03 DIAGNOSIS — Z471 Aftercare following joint replacement surgery: Secondary | ICD-10-CM | POA: Diagnosis not present

## 2014-12-03 DIAGNOSIS — S82492D Other fracture of shaft of left fibula, subsequent encounter for closed fracture with routine healing: Secondary | ICD-10-CM | POA: Diagnosis not present

## 2014-12-03 DIAGNOSIS — F418 Other specified anxiety disorders: Secondary | ICD-10-CM | POA: Diagnosis not present

## 2014-12-03 DIAGNOSIS — K219 Gastro-esophageal reflux disease without esophagitis: Secondary | ICD-10-CM | POA: Diagnosis not present

## 2014-12-03 DIAGNOSIS — S82402S Unspecified fracture of shaft of left fibula, sequela: Secondary | ICD-10-CM | POA: Diagnosis not present

## 2014-12-03 DIAGNOSIS — M069 Rheumatoid arthritis, unspecified: Secondary | ICD-10-CM | POA: Diagnosis not present

## 2014-12-03 DIAGNOSIS — S82832D Other fracture of upper and lower end of left fibula, subsequent encounter for closed fracture with routine healing: Secondary | ICD-10-CM | POA: Diagnosis not present

## 2014-12-03 DIAGNOSIS — G20 Parkinson's disease: Secondary | ICD-10-CM | POA: Diagnosis not present

## 2014-12-17 DIAGNOSIS — Z91040 Latex allergy status: Secondary | ICD-10-CM | POA: Diagnosis not present

## 2014-12-17 DIAGNOSIS — R1310 Dysphagia, unspecified: Secondary | ICD-10-CM | POA: Diagnosis not present

## 2014-12-17 DIAGNOSIS — E785 Hyperlipidemia, unspecified: Secondary | ICD-10-CM | POA: Diagnosis not present

## 2014-12-17 DIAGNOSIS — G20 Parkinson's disease: Secondary | ICD-10-CM | POA: Diagnosis not present

## 2014-12-17 DIAGNOSIS — K219 Gastro-esophageal reflux disease without esophagitis: Secondary | ICD-10-CM | POA: Diagnosis not present

## 2014-12-17 DIAGNOSIS — M069 Rheumatoid arthritis, unspecified: Secondary | ICD-10-CM | POA: Diagnosis not present

## 2014-12-17 DIAGNOSIS — E049 Nontoxic goiter, unspecified: Secondary | ICD-10-CM | POA: Diagnosis not present

## 2014-12-17 DIAGNOSIS — Z79899 Other long term (current) drug therapy: Secondary | ICD-10-CM | POA: Diagnosis not present

## 2014-12-17 DIAGNOSIS — F419 Anxiety disorder, unspecified: Secondary | ICD-10-CM | POA: Diagnosis not present

## 2014-12-17 DIAGNOSIS — Z96652 Presence of left artificial knee joint: Secondary | ICD-10-CM | POA: Diagnosis not present

## 2014-12-17 DIAGNOSIS — K225 Diverticulum of esophagus, acquired: Secondary | ICD-10-CM | POA: Diagnosis not present

## 2014-12-17 DIAGNOSIS — R490 Dysphonia: Secondary | ICD-10-CM | POA: Diagnosis not present

## 2014-12-17 DIAGNOSIS — Z9181 History of falling: Secondary | ICD-10-CM | POA: Diagnosis not present

## 2014-12-17 DIAGNOSIS — R0600 Dyspnea, unspecified: Secondary | ICD-10-CM | POA: Diagnosis not present

## 2014-12-17 DIAGNOSIS — Z85828 Personal history of other malignant neoplasm of skin: Secondary | ICD-10-CM | POA: Diagnosis not present

## 2014-12-17 DIAGNOSIS — Z886 Allergy status to analgesic agent status: Secondary | ICD-10-CM | POA: Diagnosis not present

## 2014-12-17 DIAGNOSIS — Z981 Arthrodesis status: Secondary | ICD-10-CM | POA: Diagnosis not present

## 2014-12-17 DIAGNOSIS — F329 Major depressive disorder, single episode, unspecified: Secondary | ICD-10-CM | POA: Diagnosis not present

## 2014-12-17 DIAGNOSIS — Z9689 Presence of other specified functional implants: Secondary | ICD-10-CM | POA: Diagnosis not present

## 2014-12-18 HISTORY — PX: ANKLE FRACTURE SURGERY: SHX122

## 2014-12-25 DIAGNOSIS — Z1231 Encounter for screening mammogram for malignant neoplasm of breast: Secondary | ICD-10-CM | POA: Diagnosis not present

## 2014-12-31 DIAGNOSIS — Z79899 Other long term (current) drug therapy: Secondary | ICD-10-CM | POA: Diagnosis not present

## 2014-12-31 DIAGNOSIS — G20 Parkinson's disease: Secondary | ICD-10-CM | POA: Diagnosis not present

## 2014-12-31 DIAGNOSIS — E785 Hyperlipidemia, unspecified: Secondary | ICD-10-CM | POA: Diagnosis not present

## 2014-12-31 DIAGNOSIS — S82832A Other fracture of upper and lower end of left fibula, initial encounter for closed fracture: Secondary | ICD-10-CM | POA: Diagnosis not present

## 2014-12-31 DIAGNOSIS — K219 Gastro-esophageal reflux disease without esophagitis: Secondary | ICD-10-CM | POA: Diagnosis not present

## 2014-12-31 DIAGNOSIS — Z9889 Other specified postprocedural states: Secondary | ICD-10-CM | POA: Diagnosis not present

## 2014-12-31 DIAGNOSIS — Z96652 Presence of left artificial knee joint: Secondary | ICD-10-CM | POA: Diagnosis not present

## 2014-12-31 DIAGNOSIS — Z471 Aftercare following joint replacement surgery: Secondary | ICD-10-CM | POA: Diagnosis not present

## 2015-01-06 DIAGNOSIS — M069 Rheumatoid arthritis, unspecified: Secondary | ICD-10-CM | POA: Diagnosis not present

## 2015-01-06 DIAGNOSIS — G20 Parkinson's disease: Secondary | ICD-10-CM | POA: Diagnosis not present

## 2015-01-06 DIAGNOSIS — F418 Other specified anxiety disorders: Secondary | ICD-10-CM | POA: Diagnosis not present

## 2015-01-06 DIAGNOSIS — K219 Gastro-esophageal reflux disease without esophagitis: Secondary | ICD-10-CM | POA: Diagnosis not present

## 2015-01-06 DIAGNOSIS — M810 Age-related osteoporosis without current pathological fracture: Secondary | ICD-10-CM | POA: Diagnosis not present

## 2015-01-06 DIAGNOSIS — M5134 Other intervertebral disc degeneration, thoracic region: Secondary | ICD-10-CM | POA: Diagnosis not present

## 2015-01-06 DIAGNOSIS — M5137 Other intervertebral disc degeneration, lumbosacral region: Secondary | ICD-10-CM | POA: Diagnosis not present

## 2015-01-06 DIAGNOSIS — E039 Hypothyroidism, unspecified: Secondary | ICD-10-CM | POA: Diagnosis not present

## 2015-01-06 DIAGNOSIS — M5136 Other intervertebral disc degeneration, lumbar region: Secondary | ICD-10-CM | POA: Diagnosis not present

## 2015-01-06 DIAGNOSIS — Z9181 History of falling: Secondary | ICD-10-CM | POA: Diagnosis not present

## 2015-01-08 DIAGNOSIS — M810 Age-related osteoporosis without current pathological fracture: Secondary | ICD-10-CM | POA: Diagnosis not present

## 2015-01-11 DIAGNOSIS — G20 Parkinson's disease: Secondary | ICD-10-CM | POA: Diagnosis not present

## 2015-01-11 DIAGNOSIS — R269 Unspecified abnormalities of gait and mobility: Secondary | ICD-10-CM | POA: Diagnosis not present

## 2015-01-11 DIAGNOSIS — R279 Unspecified lack of coordination: Secondary | ICD-10-CM | POA: Diagnosis not present

## 2015-01-11 DIAGNOSIS — M810 Age-related osteoporosis without current pathological fracture: Secondary | ICD-10-CM | POA: Diagnosis not present

## 2015-01-11 DIAGNOSIS — Z5189 Encounter for other specified aftercare: Secondary | ICD-10-CM | POA: Diagnosis not present

## 2015-01-14 DIAGNOSIS — M810 Age-related osteoporosis without current pathological fracture: Secondary | ICD-10-CM | POA: Diagnosis not present

## 2015-01-14 DIAGNOSIS — Z780 Asymptomatic menopausal state: Secondary | ICD-10-CM | POA: Diagnosis not present

## 2015-01-15 DIAGNOSIS — T148 Other injury of unspecified body region: Secondary | ICD-10-CM | POA: Diagnosis not present

## 2015-01-15 DIAGNOSIS — M25562 Pain in left knee: Secondary | ICD-10-CM | POA: Diagnosis not present

## 2015-01-15 DIAGNOSIS — S82402A Unspecified fracture of shaft of left fibula, initial encounter for closed fracture: Secondary | ICD-10-CM | POA: Diagnosis not present

## 2015-01-15 DIAGNOSIS — K219 Gastro-esophageal reflux disease without esophagitis: Secondary | ICD-10-CM | POA: Diagnosis not present

## 2015-01-15 DIAGNOSIS — G20 Parkinson's disease: Secondary | ICD-10-CM | POA: Diagnosis not present

## 2015-01-15 DIAGNOSIS — F329 Major depressive disorder, single episode, unspecified: Secondary | ICD-10-CM | POA: Diagnosis not present

## 2015-01-15 DIAGNOSIS — E785 Hyperlipidemia, unspecified: Secondary | ICD-10-CM | POA: Diagnosis not present

## 2015-01-15 DIAGNOSIS — S8292XA Unspecified fracture of left lower leg, initial encounter for closed fracture: Secondary | ICD-10-CM | POA: Diagnosis not present

## 2015-01-15 DIAGNOSIS — K225 Diverticulum of esophagus, acquired: Secondary | ICD-10-CM | POA: Diagnosis not present

## 2015-01-15 DIAGNOSIS — M25462 Effusion, left knee: Secondary | ICD-10-CM | POA: Diagnosis not present

## 2015-01-15 DIAGNOSIS — S82832A Other fracture of upper and lower end of left fibula, initial encounter for closed fracture: Secondary | ICD-10-CM | POA: Diagnosis not present

## 2015-01-15 DIAGNOSIS — S82292A Other fracture of shaft of left tibia, initial encounter for closed fracture: Secondary | ICD-10-CM | POA: Diagnosis not present

## 2015-01-15 DIAGNOSIS — S82392A Other fracture of lower end of left tibia, initial encounter for closed fracture: Secondary | ICD-10-CM | POA: Diagnosis not present

## 2015-01-15 DIAGNOSIS — Z96652 Presence of left artificial knee joint: Secondary | ICD-10-CM | POA: Diagnosis not present

## 2015-01-15 DIAGNOSIS — M069 Rheumatoid arthritis, unspecified: Secondary | ICD-10-CM | POA: Diagnosis not present

## 2015-01-15 DIAGNOSIS — S82202A Unspecified fracture of shaft of left tibia, initial encounter for closed fracture: Secondary | ICD-10-CM | POA: Diagnosis not present

## 2015-01-15 DIAGNOSIS — S82252A Displaced comminuted fracture of shaft of left tibia, initial encounter for closed fracture: Secondary | ICD-10-CM | POA: Diagnosis not present

## 2015-01-15 DIAGNOSIS — M810 Age-related osteoporosis without current pathological fracture: Secondary | ICD-10-CM | POA: Diagnosis not present

## 2015-01-15 DIAGNOSIS — S82492A Other fracture of shaft of left fibula, initial encounter for closed fracture: Secondary | ICD-10-CM | POA: Diagnosis not present

## 2015-01-15 DIAGNOSIS — S82302A Unspecified fracture of lower end of left tibia, initial encounter for closed fracture: Secondary | ICD-10-CM | POA: Diagnosis not present

## 2015-01-15 DIAGNOSIS — R0600 Dyspnea, unspecified: Secondary | ICD-10-CM | POA: Diagnosis not present

## 2015-01-15 DIAGNOSIS — S82452A Displaced comminuted fracture of shaft of left fibula, initial encounter for closed fracture: Secondary | ICD-10-CM | POA: Diagnosis not present

## 2015-01-15 DIAGNOSIS — M79605 Pain in left leg: Secondary | ICD-10-CM | POA: Diagnosis not present

## 2015-01-15 DIAGNOSIS — S82832D Other fracture of upper and lower end of left fibula, subsequent encounter for closed fracture with routine healing: Secondary | ICD-10-CM | POA: Diagnosis not present

## 2015-01-15 DIAGNOSIS — Z01810 Encounter for preprocedural cardiovascular examination: Secondary | ICD-10-CM | POA: Diagnosis not present

## 2015-01-15 DIAGNOSIS — W19XXXA Unspecified fall, initial encounter: Secondary | ICD-10-CM | POA: Diagnosis not present

## 2015-01-15 DIAGNOSIS — Z91040 Latex allergy status: Secondary | ICD-10-CM | POA: Diagnosis not present

## 2015-01-15 DIAGNOSIS — Z9689 Presence of other specified functional implants: Secondary | ICD-10-CM | POA: Diagnosis not present

## 2015-01-15 DIAGNOSIS — Z886 Allergy status to analgesic agent status: Secondary | ICD-10-CM | POA: Diagnosis not present

## 2015-01-18 DIAGNOSIS — R52 Pain, unspecified: Secondary | ICD-10-CM | POA: Diagnosis not present

## 2015-01-18 DIAGNOSIS — G20 Parkinson's disease: Secondary | ICD-10-CM | POA: Diagnosis not present

## 2015-01-18 DIAGNOSIS — M069 Rheumatoid arthritis, unspecified: Secondary | ICD-10-CM | POA: Diagnosis not present

## 2015-01-18 DIAGNOSIS — M542 Cervicalgia: Secondary | ICD-10-CM | POA: Diagnosis not present

## 2015-01-18 DIAGNOSIS — R7989 Other specified abnormal findings of blood chemistry: Secondary | ICD-10-CM | POA: Diagnosis not present

## 2015-01-18 DIAGNOSIS — D509 Iron deficiency anemia, unspecified: Secondary | ICD-10-CM | POA: Diagnosis not present

## 2015-01-18 DIAGNOSIS — Z7409 Other reduced mobility: Secondary | ICD-10-CM | POA: Diagnosis not present

## 2015-01-18 DIAGNOSIS — R51 Headache: Secondary | ICD-10-CM | POA: Diagnosis not present

## 2015-01-18 DIAGNOSIS — S82202D Unspecified fracture of shaft of left tibia, subsequent encounter for closed fracture with routine healing: Secondary | ICD-10-CM | POA: Diagnosis not present

## 2015-01-18 DIAGNOSIS — Z91040 Latex allergy status: Secondary | ICD-10-CM | POA: Diagnosis not present

## 2015-01-18 DIAGNOSIS — Z886 Allergy status to analgesic agent status: Secondary | ICD-10-CM | POA: Diagnosis not present

## 2015-01-18 DIAGNOSIS — E039 Hypothyroidism, unspecified: Secondary | ICD-10-CM | POA: Diagnosis not present

## 2015-01-18 DIAGNOSIS — F329 Major depressive disorder, single episode, unspecified: Secondary | ICD-10-CM | POA: Diagnosis not present

## 2015-01-19 DIAGNOSIS — D509 Iron deficiency anemia, unspecified: Secondary | ICD-10-CM | POA: Diagnosis not present

## 2015-01-19 DIAGNOSIS — F419 Anxiety disorder, unspecified: Secondary | ICD-10-CM | POA: Diagnosis present

## 2015-01-19 DIAGNOSIS — N390 Urinary tract infection, site not specified: Secondary | ICD-10-CM | POA: Diagnosis present

## 2015-01-19 DIAGNOSIS — K225 Diverticulum of esophagus, acquired: Secondary | ICD-10-CM | POA: Diagnosis present

## 2015-01-19 DIAGNOSIS — R7989 Other specified abnormal findings of blood chemistry: Secondary | ICD-10-CM | POA: Diagnosis not present

## 2015-01-19 DIAGNOSIS — S82832D Other fracture of upper and lower end of left fibula, subsequent encounter for closed fracture with routine healing: Secondary | ICD-10-CM | POA: Diagnosis not present

## 2015-01-19 DIAGNOSIS — Z85828 Personal history of other malignant neoplasm of skin: Secondary | ICD-10-CM | POA: Diagnosis not present

## 2015-01-19 DIAGNOSIS — R490 Dysphonia: Secondary | ICD-10-CM | POA: Diagnosis present

## 2015-01-19 DIAGNOSIS — M069 Rheumatoid arthritis, unspecified: Secondary | ICD-10-CM | POA: Diagnosis present

## 2015-01-19 DIAGNOSIS — S82402D Unspecified fracture of shaft of left fibula, subsequent encounter for closed fracture with routine healing: Secondary | ICD-10-CM | POA: Diagnosis not present

## 2015-01-19 DIAGNOSIS — Z7401 Bed confinement status: Secondary | ICD-10-CM | POA: Diagnosis not present

## 2015-01-19 DIAGNOSIS — K219 Gastro-esophageal reflux disease without esophagitis: Secondary | ICD-10-CM | POA: Diagnosis present

## 2015-01-19 DIAGNOSIS — S82202A Unspecified fracture of shaft of left tibia, initial encounter for closed fracture: Secondary | ICD-10-CM | POA: Diagnosis not present

## 2015-01-19 DIAGNOSIS — A498 Other bacterial infections of unspecified site: Secondary | ICD-10-CM | POA: Diagnosis not present

## 2015-01-19 DIAGNOSIS — M542 Cervicalgia: Secondary | ICD-10-CM | POA: Diagnosis present

## 2015-01-19 DIAGNOSIS — E039 Hypothyroidism, unspecified: Secondary | ICD-10-CM | POA: Diagnosis present

## 2015-01-19 DIAGNOSIS — K5900 Constipation, unspecified: Secondary | ICD-10-CM | POA: Diagnosis not present

## 2015-01-19 DIAGNOSIS — R32 Unspecified urinary incontinence: Secondary | ICD-10-CM | POA: Diagnosis not present

## 2015-01-19 DIAGNOSIS — S82302D Unspecified fracture of lower end of left tibia, subsequent encounter for closed fracture with routine healing: Secondary | ICD-10-CM | POA: Diagnosis not present

## 2015-01-19 DIAGNOSIS — Z7982 Long term (current) use of aspirin: Secondary | ICD-10-CM | POA: Diagnosis not present

## 2015-01-19 DIAGNOSIS — E785 Hyperlipidemia, unspecified: Secondary | ICD-10-CM | POA: Diagnosis present

## 2015-01-19 DIAGNOSIS — S82202D Unspecified fracture of shaft of left tibia, subsequent encounter for closed fracture with routine healing: Secondary | ICD-10-CM | POA: Diagnosis not present

## 2015-01-19 DIAGNOSIS — R531 Weakness: Secondary | ICD-10-CM | POA: Diagnosis not present

## 2015-01-19 DIAGNOSIS — Z7409 Other reduced mobility: Secondary | ICD-10-CM | POA: Diagnosis not present

## 2015-01-19 DIAGNOSIS — F329 Major depressive disorder, single episode, unspecified: Secondary | ICD-10-CM | POA: Diagnosis present

## 2015-01-19 DIAGNOSIS — R51 Headache: Secondary | ICD-10-CM | POA: Diagnosis present

## 2015-01-19 DIAGNOSIS — G20 Parkinson's disease: Secondary | ICD-10-CM | POA: Diagnosis present

## 2015-01-19 DIAGNOSIS — T8351XA Infection and inflammatory reaction due to indwelling urinary catheter, initial encounter: Secondary | ICD-10-CM | POA: Diagnosis present

## 2015-01-27 DIAGNOSIS — F329 Major depressive disorder, single episode, unspecified: Secondary | ICD-10-CM | POA: Diagnosis present

## 2015-01-27 DIAGNOSIS — Z7982 Long term (current) use of aspirin: Secondary | ICD-10-CM | POA: Diagnosis not present

## 2015-01-27 DIAGNOSIS — D62 Acute posthemorrhagic anemia: Secondary | ICD-10-CM | POA: Diagnosis not present

## 2015-01-27 DIAGNOSIS — M25475 Effusion, left foot: Secondary | ICD-10-CM | POA: Diagnosis not present

## 2015-01-27 DIAGNOSIS — M810 Age-related osteoporosis without current pathological fracture: Secondary | ICD-10-CM | POA: Diagnosis present

## 2015-01-27 DIAGNOSIS — M069 Rheumatoid arthritis, unspecified: Secondary | ICD-10-CM | POA: Diagnosis present

## 2015-01-27 DIAGNOSIS — K219 Gastro-esophageal reflux disease without esophagitis: Secondary | ICD-10-CM | POA: Diagnosis present

## 2015-01-27 DIAGNOSIS — R339 Retention of urine, unspecified: Secondary | ICD-10-CM | POA: Diagnosis present

## 2015-01-27 DIAGNOSIS — M6530 Trigger finger, unspecified finger: Secondary | ICD-10-CM | POA: Diagnosis not present

## 2015-01-27 DIAGNOSIS — M79662 Pain in left lower leg: Secondary | ICD-10-CM | POA: Diagnosis not present

## 2015-01-27 DIAGNOSIS — T84629A Infection and inflammatory reaction due to internal fixation device of unspecified bone of leg, initial encounter: Secondary | ICD-10-CM | POA: Diagnosis present

## 2015-01-27 DIAGNOSIS — R0981 Nasal congestion: Secondary | ICD-10-CM | POA: Diagnosis not present

## 2015-01-27 DIAGNOSIS — Z85828 Personal history of other malignant neoplasm of skin: Secondary | ICD-10-CM | POA: Diagnosis not present

## 2015-01-27 DIAGNOSIS — T814XXA Infection following a procedure, initial encounter: Secondary | ICD-10-CM | POA: Diagnosis present

## 2015-01-27 DIAGNOSIS — R51 Headache: Secondary | ICD-10-CM | POA: Diagnosis present

## 2015-01-27 DIAGNOSIS — T368X5A Adverse effect of other systemic antibiotics, initial encounter: Secondary | ICD-10-CM | POA: Diagnosis not present

## 2015-01-27 DIAGNOSIS — G20 Parkinson's disease: Secondary | ICD-10-CM | POA: Diagnosis present

## 2015-01-27 DIAGNOSIS — M542 Cervicalgia: Secondary | ICD-10-CM | POA: Diagnosis not present

## 2015-01-27 DIAGNOSIS — N390 Urinary tract infection, site not specified: Secondary | ICD-10-CM | POA: Diagnosis not present

## 2015-01-27 DIAGNOSIS — M7989 Other specified soft tissue disorders: Secondary | ICD-10-CM | POA: Diagnosis present

## 2015-01-27 DIAGNOSIS — E079 Disorder of thyroid, unspecified: Secondary | ICD-10-CM | POA: Diagnosis present

## 2015-01-27 DIAGNOSIS — D509 Iron deficiency anemia, unspecified: Secondary | ICD-10-CM | POA: Diagnosis not present

## 2015-01-27 DIAGNOSIS — S82202D Unspecified fracture of shaft of left tibia, subsequent encounter for closed fracture with routine healing: Secondary | ICD-10-CM | POA: Diagnosis not present

## 2015-01-27 DIAGNOSIS — E039 Hypothyroidism, unspecified: Secondary | ICD-10-CM | POA: Diagnosis present

## 2015-01-27 DIAGNOSIS — T148 Other injury of unspecified body region: Secondary | ICD-10-CM | POA: Diagnosis not present

## 2015-01-27 DIAGNOSIS — L03116 Cellulitis of left lower limb: Secondary | ICD-10-CM | POA: Diagnosis not present

## 2015-01-27 DIAGNOSIS — R531 Weakness: Secondary | ICD-10-CM | POA: Diagnosis not present

## 2015-01-27 DIAGNOSIS — N179 Acute kidney failure, unspecified: Secondary | ICD-10-CM | POA: Diagnosis not present

## 2015-01-27 DIAGNOSIS — Z79899 Other long term (current) drug therapy: Secondary | ICD-10-CM | POA: Diagnosis not present

## 2015-01-27 DIAGNOSIS — F419 Anxiety disorder, unspecified: Secondary | ICD-10-CM | POA: Diagnosis present

## 2015-01-27 DIAGNOSIS — F0280 Dementia in other diseases classified elsewhere without behavioral disturbance: Secondary | ICD-10-CM | POA: Diagnosis not present

## 2015-01-27 DIAGNOSIS — S82302D Unspecified fracture of lower end of left tibia, subsequent encounter for closed fracture with routine healing: Secondary | ICD-10-CM | POA: Diagnosis not present

## 2015-01-27 DIAGNOSIS — Z7409 Other reduced mobility: Secondary | ICD-10-CM | POA: Diagnosis not present

## 2015-01-27 DIAGNOSIS — M65332 Trigger finger, left middle finger: Secondary | ICD-10-CM | POA: Diagnosis not present

## 2015-01-27 DIAGNOSIS — M1712 Unilateral primary osteoarthritis, left knee: Secondary | ICD-10-CM | POA: Diagnosis present

## 2015-01-27 DIAGNOSIS — E785 Hyperlipidemia, unspecified: Secondary | ICD-10-CM | POA: Diagnosis present

## 2015-01-27 DIAGNOSIS — K5900 Constipation, unspecified: Secondary | ICD-10-CM | POA: Diagnosis not present

## 2015-01-27 DIAGNOSIS — A498 Other bacterial infections of unspecified site: Secondary | ICD-10-CM | POA: Diagnosis not present

## 2015-01-27 DIAGNOSIS — M25579 Pain in unspecified ankle and joints of unspecified foot: Secondary | ICD-10-CM | POA: Diagnosis not present

## 2015-01-27 DIAGNOSIS — Z7401 Bed confinement status: Secondary | ICD-10-CM | POA: Diagnosis not present

## 2015-01-27 DIAGNOSIS — I10 Essential (primary) hypertension: Secondary | ICD-10-CM | POA: Diagnosis present

## 2015-01-27 DIAGNOSIS — M25572 Pain in left ankle and joints of left foot: Secondary | ICD-10-CM | POA: Diagnosis present

## 2015-01-27 DIAGNOSIS — Z4789 Encounter for other orthopedic aftercare: Secondary | ICD-10-CM | POA: Diagnosis not present

## 2015-01-27 DIAGNOSIS — K225 Diverticulum of esophagus, acquired: Secondary | ICD-10-CM | POA: Diagnosis present

## 2015-01-27 DIAGNOSIS — S82202A Unspecified fracture of shaft of left tibia, initial encounter for closed fracture: Secondary | ICD-10-CM | POA: Diagnosis not present

## 2015-01-27 DIAGNOSIS — Z96652 Presence of left artificial knee joint: Secondary | ICD-10-CM | POA: Diagnosis present

## 2015-01-27 DIAGNOSIS — S82402D Unspecified fracture of shaft of left fibula, subsequent encounter for closed fracture with routine healing: Secondary | ICD-10-CM | POA: Diagnosis not present

## 2015-01-29 DIAGNOSIS — S82202A Unspecified fracture of shaft of left tibia, initial encounter for closed fracture: Secondary | ICD-10-CM | POA: Diagnosis not present

## 2015-01-29 DIAGNOSIS — R0981 Nasal congestion: Secondary | ICD-10-CM | POA: Diagnosis not present

## 2015-01-29 DIAGNOSIS — G20 Parkinson's disease: Secondary | ICD-10-CM | POA: Diagnosis not present

## 2015-02-02 DIAGNOSIS — R0981 Nasal congestion: Secondary | ICD-10-CM | POA: Diagnosis not present

## 2015-02-02 DIAGNOSIS — G20 Parkinson's disease: Secondary | ICD-10-CM | POA: Diagnosis not present

## 2015-02-02 DIAGNOSIS — M069 Rheumatoid arthritis, unspecified: Secondary | ICD-10-CM | POA: Diagnosis not present

## 2015-02-02 DIAGNOSIS — R51 Headache: Secondary | ICD-10-CM | POA: Diagnosis not present

## 2015-02-16 DIAGNOSIS — G20 Parkinson's disease: Secondary | ICD-10-CM | POA: Diagnosis not present

## 2015-02-16 DIAGNOSIS — S82202D Unspecified fracture of shaft of left tibia, subsequent encounter for closed fracture with routine healing: Secondary | ICD-10-CM | POA: Diagnosis not present

## 2015-02-16 DIAGNOSIS — L03116 Cellulitis of left lower limb: Secondary | ICD-10-CM | POA: Diagnosis not present

## 2015-02-16 DIAGNOSIS — M069 Rheumatoid arthritis, unspecified: Secondary | ICD-10-CM | POA: Diagnosis not present

## 2015-02-19 DIAGNOSIS — G20 Parkinson's disease: Secondary | ICD-10-CM | POA: Diagnosis not present

## 2015-02-19 DIAGNOSIS — F0280 Dementia in other diseases classified elsewhere without behavioral disturbance: Secondary | ICD-10-CM | POA: Diagnosis not present

## 2015-02-19 DIAGNOSIS — M069 Rheumatoid arthritis, unspecified: Secondary | ICD-10-CM | POA: Diagnosis not present

## 2015-02-19 DIAGNOSIS — E039 Hypothyroidism, unspecified: Secondary | ICD-10-CM | POA: Diagnosis not present

## 2015-02-25 DIAGNOSIS — M65332 Trigger finger, left middle finger: Secondary | ICD-10-CM | POA: Diagnosis not present

## 2015-02-25 DIAGNOSIS — S82202D Unspecified fracture of shaft of left tibia, subsequent encounter for closed fracture with routine healing: Secondary | ICD-10-CM | POA: Diagnosis not present

## 2015-02-25 DIAGNOSIS — M6530 Trigger finger, unspecified finger: Secondary | ICD-10-CM | POA: Diagnosis not present

## 2015-02-25 DIAGNOSIS — M069 Rheumatoid arthritis, unspecified: Secondary | ICD-10-CM | POA: Diagnosis not present

## 2015-02-25 DIAGNOSIS — M810 Age-related osteoporosis without current pathological fracture: Secondary | ICD-10-CM | POA: Diagnosis not present

## 2015-02-25 DIAGNOSIS — S82402D Unspecified fracture of shaft of left fibula, subsequent encounter for closed fracture with routine healing: Secondary | ICD-10-CM | POA: Diagnosis not present

## 2015-02-25 DIAGNOSIS — Z79899 Other long term (current) drug therapy: Secondary | ICD-10-CM | POA: Diagnosis not present

## 2015-03-16 DIAGNOSIS — S82202A Unspecified fracture of shaft of left tibia, initial encounter for closed fracture: Secondary | ICD-10-CM | POA: Diagnosis not present

## 2015-03-16 DIAGNOSIS — M069 Rheumatoid arthritis, unspecified: Secondary | ICD-10-CM | POA: Diagnosis not present

## 2015-03-16 DIAGNOSIS — L03116 Cellulitis of left lower limb: Secondary | ICD-10-CM | POA: Diagnosis not present

## 2015-03-19 DIAGNOSIS — I10 Essential (primary) hypertension: Secondary | ICD-10-CM | POA: Diagnosis not present

## 2015-03-19 DIAGNOSIS — M069 Rheumatoid arthritis, unspecified: Secondary | ICD-10-CM | POA: Diagnosis not present

## 2015-03-19 DIAGNOSIS — D509 Iron deficiency anemia, unspecified: Secondary | ICD-10-CM | POA: Diagnosis not present

## 2015-03-19 DIAGNOSIS — T148 Other injury of unspecified body region: Secondary | ICD-10-CM | POA: Diagnosis not present

## 2015-03-23 DIAGNOSIS — L03116 Cellulitis of left lower limb: Secondary | ICD-10-CM | POA: Diagnosis not present

## 2015-03-25 DIAGNOSIS — N179 Acute kidney failure, unspecified: Secondary | ICD-10-CM | POA: Diagnosis not present

## 2015-03-25 DIAGNOSIS — T84197A Other mechanical complication of internal fixation device of bone of left lower leg, initial encounter: Secondary | ICD-10-CM | POA: Diagnosis not present

## 2015-03-25 DIAGNOSIS — Z4789 Encounter for other orthopedic aftercare: Secondary | ICD-10-CM | POA: Diagnosis not present

## 2015-03-25 DIAGNOSIS — D62 Acute posthemorrhagic anemia: Secondary | ICD-10-CM | POA: Diagnosis not present

## 2015-03-25 DIAGNOSIS — M7989 Other specified soft tissue disorders: Secondary | ICD-10-CM | POA: Diagnosis not present

## 2015-03-25 DIAGNOSIS — M25572 Pain in left ankle and joints of left foot: Secondary | ICD-10-CM | POA: Diagnosis not present

## 2015-03-25 DIAGNOSIS — T84629A Infection and inflammatory reaction due to internal fixation device of unspecified bone of leg, initial encounter: Secondary | ICD-10-CM | POA: Diagnosis not present

## 2015-03-25 DIAGNOSIS — T814XXA Infection following a procedure, initial encounter: Secondary | ICD-10-CM | POA: Diagnosis not present

## 2015-03-25 DIAGNOSIS — G20 Parkinson's disease: Secondary | ICD-10-CM | POA: Diagnosis not present

## 2015-03-25 DIAGNOSIS — M069 Rheumatoid arthritis, unspecified: Secondary | ICD-10-CM | POA: Diagnosis not present

## 2015-03-26 DIAGNOSIS — L03116 Cellulitis of left lower limb: Secondary | ICD-10-CM | POA: Diagnosis not present

## 2015-03-26 DIAGNOSIS — I10 Essential (primary) hypertension: Secondary | ICD-10-CM | POA: Diagnosis not present

## 2015-03-26 DIAGNOSIS — M25572 Pain in left ankle and joints of left foot: Secondary | ICD-10-CM | POA: Diagnosis not present

## 2015-03-26 DIAGNOSIS — M7989 Other specified soft tissue disorders: Secondary | ICD-10-CM | POA: Diagnosis not present

## 2015-03-26 DIAGNOSIS — T84197A Other mechanical complication of internal fixation device of bone of left lower leg, initial encounter: Secondary | ICD-10-CM | POA: Diagnosis not present

## 2015-03-27 DIAGNOSIS — T84623A Infection and inflammatory reaction due to internal fixation device of left tibia, initial encounter: Secondary | ICD-10-CM | POA: Diagnosis not present

## 2015-03-27 DIAGNOSIS — Z472 Encounter for removal of internal fixation device: Secondary | ICD-10-CM | POA: Diagnosis not present

## 2015-03-27 DIAGNOSIS — T847XXD Infection and inflammatory reaction due to other internal orthopedic prosthetic devices, implants and grafts, subsequent encounter: Secondary | ICD-10-CM | POA: Diagnosis not present

## 2015-03-27 DIAGNOSIS — G20 Parkinson's disease: Secondary | ICD-10-CM | POA: Diagnosis not present

## 2015-03-27 DIAGNOSIS — M25572 Pain in left ankle and joints of left foot: Secondary | ICD-10-CM | POA: Diagnosis not present

## 2015-03-27 DIAGNOSIS — M7989 Other specified soft tissue disorders: Secondary | ICD-10-CM | POA: Diagnosis not present

## 2015-03-27 DIAGNOSIS — F419 Anxiety disorder, unspecified: Secondary | ICD-10-CM | POA: Diagnosis not present

## 2015-03-27 DIAGNOSIS — K219 Gastro-esophageal reflux disease without esophagitis: Secondary | ICD-10-CM | POA: Diagnosis not present

## 2015-03-27 DIAGNOSIS — T84197A Other mechanical complication of internal fixation device of bone of left lower leg, initial encounter: Secondary | ICD-10-CM | POA: Diagnosis not present

## 2015-03-27 DIAGNOSIS — E039 Hypothyroidism, unspecified: Secondary | ICD-10-CM | POA: Diagnosis not present

## 2015-03-27 DIAGNOSIS — T814XXA Infection following a procedure, initial encounter: Secondary | ICD-10-CM | POA: Diagnosis not present

## 2015-03-27 DIAGNOSIS — M069 Rheumatoid arthritis, unspecified: Secondary | ICD-10-CM | POA: Diagnosis not present

## 2015-03-28 DIAGNOSIS — R51 Headache: Secondary | ICD-10-CM | POA: Diagnosis present

## 2015-03-28 DIAGNOSIS — E785 Hyperlipidemia, unspecified: Secondary | ICD-10-CM | POA: Diagnosis present

## 2015-03-28 DIAGNOSIS — N179 Acute kidney failure, unspecified: Secondary | ICD-10-CM | POA: Diagnosis not present

## 2015-03-28 DIAGNOSIS — T847XXA Infection and inflammatory reaction due to other internal orthopedic prosthetic devices, implants and grafts, initial encounter: Secondary | ICD-10-CM | POA: Diagnosis not present

## 2015-03-28 DIAGNOSIS — R339 Retention of urine, unspecified: Secondary | ICD-10-CM | POA: Diagnosis present

## 2015-03-28 DIAGNOSIS — M7989 Other specified soft tissue disorders: Secondary | ICD-10-CM | POA: Diagnosis present

## 2015-03-28 DIAGNOSIS — I10 Essential (primary) hypertension: Secondary | ICD-10-CM | POA: Diagnosis present

## 2015-03-28 DIAGNOSIS — T368X5A Adverse effect of other systemic antibiotics, initial encounter: Secondary | ICD-10-CM | POA: Diagnosis not present

## 2015-03-28 DIAGNOSIS — E079 Disorder of thyroid, unspecified: Secondary | ICD-10-CM | POA: Diagnosis present

## 2015-03-28 DIAGNOSIS — S82302D Unspecified fracture of lower end of left tibia, subsequent encounter for closed fracture with routine healing: Secondary | ICD-10-CM | POA: Diagnosis not present

## 2015-03-28 DIAGNOSIS — R0981 Nasal congestion: Secondary | ICD-10-CM | POA: Diagnosis not present

## 2015-03-28 DIAGNOSIS — N390 Urinary tract infection, site not specified: Secondary | ICD-10-CM | POA: Diagnosis not present

## 2015-03-28 DIAGNOSIS — G20 Parkinson's disease: Secondary | ICD-10-CM | POA: Diagnosis present

## 2015-03-28 DIAGNOSIS — M1712 Unilateral primary osteoarthritis, left knee: Secondary | ICD-10-CM | POA: Diagnosis present

## 2015-03-28 DIAGNOSIS — M25572 Pain in left ankle and joints of left foot: Secondary | ICD-10-CM | POA: Diagnosis present

## 2015-03-28 DIAGNOSIS — T814XXA Infection following a procedure, initial encounter: Secondary | ICD-10-CM | POA: Diagnosis present

## 2015-03-28 DIAGNOSIS — F419 Anxiety disorder, unspecified: Secondary | ICD-10-CM | POA: Diagnosis present

## 2015-03-28 DIAGNOSIS — D649 Anemia, unspecified: Secondary | ICD-10-CM | POA: Diagnosis not present

## 2015-03-28 DIAGNOSIS — M810 Age-related osteoporosis without current pathological fracture: Secondary | ICD-10-CM | POA: Diagnosis present

## 2015-03-28 DIAGNOSIS — M069 Rheumatoid arthritis, unspecified: Secondary | ICD-10-CM | POA: Diagnosis present

## 2015-03-28 DIAGNOSIS — T84629A Infection and inflammatory reaction due to internal fixation device of unspecified bone of leg, initial encounter: Secondary | ICD-10-CM | POA: Diagnosis present

## 2015-03-28 DIAGNOSIS — D509 Iron deficiency anemia, unspecified: Secondary | ICD-10-CM | POA: Diagnosis not present

## 2015-03-28 DIAGNOSIS — E039 Hypothyroidism, unspecified: Secondary | ICD-10-CM | POA: Diagnosis present

## 2015-03-28 DIAGNOSIS — Z792 Long term (current) use of antibiotics: Secondary | ICD-10-CM | POA: Diagnosis not present

## 2015-03-28 DIAGNOSIS — Z7409 Other reduced mobility: Secondary | ICD-10-CM | POA: Diagnosis not present

## 2015-03-28 DIAGNOSIS — F329 Major depressive disorder, single episode, unspecified: Secondary | ICD-10-CM | POA: Diagnosis present

## 2015-03-28 DIAGNOSIS — Z85828 Personal history of other malignant neoplasm of skin: Secondary | ICD-10-CM | POA: Diagnosis not present

## 2015-03-28 DIAGNOSIS — K225 Diverticulum of esophagus, acquired: Secondary | ICD-10-CM | POA: Diagnosis present

## 2015-03-28 DIAGNOSIS — Z96652 Presence of left artificial knee joint: Secondary | ICD-10-CM | POA: Diagnosis present

## 2015-03-28 DIAGNOSIS — R531 Weakness: Secondary | ICD-10-CM | POA: Diagnosis not present

## 2015-03-28 DIAGNOSIS — Z7982 Long term (current) use of aspirin: Secondary | ICD-10-CM | POA: Diagnosis not present

## 2015-03-28 DIAGNOSIS — K219 Gastro-esophageal reflux disease without esophagitis: Secondary | ICD-10-CM | POA: Diagnosis present

## 2015-03-28 DIAGNOSIS — D62 Acute posthemorrhagic anemia: Secondary | ICD-10-CM | POA: Diagnosis not present

## 2015-04-04 DIAGNOSIS — D62 Acute posthemorrhagic anemia: Secondary | ICD-10-CM | POA: Diagnosis not present

## 2015-04-04 DIAGNOSIS — M810 Age-related osteoporosis without current pathological fracture: Secondary | ICD-10-CM | POA: Diagnosis not present

## 2015-04-04 DIAGNOSIS — R339 Retention of urine, unspecified: Secondary | ICD-10-CM | POA: Diagnosis not present

## 2015-04-04 DIAGNOSIS — S82202A Unspecified fracture of shaft of left tibia, initial encounter for closed fracture: Secondary | ICD-10-CM | POA: Diagnosis not present

## 2015-04-04 DIAGNOSIS — Z7409 Other reduced mobility: Secondary | ICD-10-CM | POA: Diagnosis not present

## 2015-04-04 DIAGNOSIS — S82302D Unspecified fracture of lower end of left tibia, subsequent encounter for closed fracture with routine healing: Secondary | ICD-10-CM | POA: Diagnosis not present

## 2015-04-04 DIAGNOSIS — F418 Other specified anxiety disorders: Secondary | ICD-10-CM | POA: Diagnosis not present

## 2015-04-04 DIAGNOSIS — E785 Hyperlipidemia, unspecified: Secondary | ICD-10-CM | POA: Diagnosis not present

## 2015-04-04 DIAGNOSIS — I10 Essential (primary) hypertension: Secondary | ICD-10-CM | POA: Diagnosis not present

## 2015-04-04 DIAGNOSIS — M86272 Subacute osteomyelitis, left ankle and foot: Secondary | ICD-10-CM | POA: Diagnosis not present

## 2015-04-04 DIAGNOSIS — M5137 Other intervertebral disc degeneration, lumbosacral region: Secondary | ICD-10-CM | POA: Diagnosis not present

## 2015-04-04 DIAGNOSIS — R531 Weakness: Secondary | ICD-10-CM | POA: Diagnosis not present

## 2015-04-04 DIAGNOSIS — T814XXA Infection following a procedure, initial encounter: Secondary | ICD-10-CM | POA: Diagnosis not present

## 2015-04-04 DIAGNOSIS — Z9889 Other specified postprocedural states: Secondary | ICD-10-CM | POA: Diagnosis not present

## 2015-04-04 DIAGNOSIS — S82402D Unspecified fracture of shaft of left fibula, subsequent encounter for closed fracture with routine healing: Secondary | ICD-10-CM | POA: Diagnosis not present

## 2015-04-04 DIAGNOSIS — M059 Rheumatoid arthritis with rheumatoid factor, unspecified: Secondary | ICD-10-CM | POA: Diagnosis not present

## 2015-04-04 DIAGNOSIS — F0280 Dementia in other diseases classified elsewhere without behavioral disturbance: Secondary | ICD-10-CM | POA: Diagnosis not present

## 2015-04-04 DIAGNOSIS — K219 Gastro-esophageal reflux disease without esophagitis: Secondary | ICD-10-CM | POA: Diagnosis not present

## 2015-04-04 DIAGNOSIS — N179 Acute kidney failure, unspecified: Secondary | ICD-10-CM | POA: Diagnosis not present

## 2015-04-04 DIAGNOSIS — E039 Hypothyroidism, unspecified: Secondary | ICD-10-CM | POA: Diagnosis not present

## 2015-04-04 DIAGNOSIS — Z7982 Long term (current) use of aspirin: Secondary | ICD-10-CM | POA: Diagnosis not present

## 2015-04-04 DIAGNOSIS — R51 Headache: Secondary | ICD-10-CM | POA: Diagnosis not present

## 2015-04-04 DIAGNOSIS — M5136 Other intervertebral disc degeneration, lumbar region: Secondary | ICD-10-CM | POA: Diagnosis not present

## 2015-04-04 DIAGNOSIS — Z9181 History of falling: Secondary | ICD-10-CM | POA: Diagnosis not present

## 2015-04-04 DIAGNOSIS — M069 Rheumatoid arthritis, unspecified: Secondary | ICD-10-CM | POA: Diagnosis not present

## 2015-04-04 DIAGNOSIS — T148 Other injury of unspecified body region: Secondary | ICD-10-CM | POA: Diagnosis not present

## 2015-04-04 DIAGNOSIS — Z885 Allergy status to narcotic agent status: Secondary | ICD-10-CM | POA: Diagnosis not present

## 2015-04-04 DIAGNOSIS — G20 Parkinson's disease: Secondary | ICD-10-CM | POA: Diagnosis not present

## 2015-04-04 DIAGNOSIS — S82202D Unspecified fracture of shaft of left tibia, subsequent encounter for closed fracture with routine healing: Secondary | ICD-10-CM | POA: Diagnosis not present

## 2015-04-04 DIAGNOSIS — N390 Urinary tract infection, site not specified: Secondary | ICD-10-CM | POA: Diagnosis not present

## 2015-04-04 DIAGNOSIS — T814XXD Infection following a procedure, subsequent encounter: Secondary | ICD-10-CM | POA: Diagnosis not present

## 2015-04-04 DIAGNOSIS — E876 Hypokalemia: Secondary | ICD-10-CM | POA: Diagnosis not present

## 2015-04-04 DIAGNOSIS — D509 Iron deficiency anemia, unspecified: Secondary | ICD-10-CM | POA: Diagnosis not present

## 2015-04-04 DIAGNOSIS — Z780 Asymptomatic menopausal state: Secondary | ICD-10-CM | POA: Diagnosis not present

## 2015-04-04 DIAGNOSIS — M0689 Other specified rheumatoid arthritis, multiple sites: Secondary | ICD-10-CM | POA: Diagnosis not present

## 2015-04-04 DIAGNOSIS — Z79899 Other long term (current) drug therapy: Secondary | ICD-10-CM | POA: Diagnosis not present

## 2015-04-04 DIAGNOSIS — F419 Anxiety disorder, unspecified: Secondary | ICD-10-CM | POA: Diagnosis not present

## 2015-04-04 DIAGNOSIS — M869 Osteomyelitis, unspecified: Secondary | ICD-10-CM | POA: Diagnosis not present

## 2015-04-04 DIAGNOSIS — E559 Vitamin D deficiency, unspecified: Secondary | ICD-10-CM | POA: Diagnosis not present

## 2015-04-04 DIAGNOSIS — F329 Major depressive disorder, single episode, unspecified: Secondary | ICD-10-CM | POA: Diagnosis not present

## 2015-04-06 DIAGNOSIS — F0280 Dementia in other diseases classified elsewhere without behavioral disturbance: Secondary | ICD-10-CM | POA: Diagnosis not present

## 2015-04-06 DIAGNOSIS — G20 Parkinson's disease: Secondary | ICD-10-CM | POA: Diagnosis not present

## 2015-04-06 DIAGNOSIS — M069 Rheumatoid arthritis, unspecified: Secondary | ICD-10-CM | POA: Diagnosis not present

## 2015-04-06 DIAGNOSIS — D509 Iron deficiency anemia, unspecified: Secondary | ICD-10-CM | POA: Diagnosis not present

## 2015-04-08 DIAGNOSIS — S82202D Unspecified fracture of shaft of left tibia, subsequent encounter for closed fracture with routine healing: Secondary | ICD-10-CM | POA: Diagnosis not present

## 2015-04-08 DIAGNOSIS — S82402D Unspecified fracture of shaft of left fibula, subsequent encounter for closed fracture with routine healing: Secondary | ICD-10-CM | POA: Diagnosis not present

## 2015-04-08 DIAGNOSIS — Z9889 Other specified postprocedural states: Secondary | ICD-10-CM | POA: Diagnosis not present

## 2015-04-09 DIAGNOSIS — I10 Essential (primary) hypertension: Secondary | ICD-10-CM | POA: Diagnosis not present

## 2015-04-09 DIAGNOSIS — G20 Parkinson's disease: Secondary | ICD-10-CM | POA: Diagnosis not present

## 2015-04-09 DIAGNOSIS — T148 Other injury of unspecified body region: Secondary | ICD-10-CM | POA: Diagnosis not present

## 2015-04-09 DIAGNOSIS — F0280 Dementia in other diseases classified elsewhere without behavioral disturbance: Secondary | ICD-10-CM | POA: Diagnosis not present

## 2015-04-13 DIAGNOSIS — F0280 Dementia in other diseases classified elsewhere without behavioral disturbance: Secondary | ICD-10-CM | POA: Diagnosis not present

## 2015-04-13 DIAGNOSIS — I10 Essential (primary) hypertension: Secondary | ICD-10-CM | POA: Diagnosis not present

## 2015-04-13 DIAGNOSIS — G20 Parkinson's disease: Secondary | ICD-10-CM | POA: Diagnosis not present

## 2015-04-14 DIAGNOSIS — K219 Gastro-esophageal reflux disease without esophagitis: Secondary | ICD-10-CM | POA: Diagnosis not present

## 2015-04-14 DIAGNOSIS — Z9181 History of falling: Secondary | ICD-10-CM | POA: Diagnosis not present

## 2015-04-14 DIAGNOSIS — E559 Vitamin D deficiency, unspecified: Secondary | ICD-10-CM | POA: Diagnosis not present

## 2015-04-14 DIAGNOSIS — M810 Age-related osteoporosis without current pathological fracture: Secondary | ICD-10-CM | POA: Diagnosis not present

## 2015-04-14 DIAGNOSIS — E039 Hypothyroidism, unspecified: Secondary | ICD-10-CM | POA: Diagnosis not present

## 2015-04-14 DIAGNOSIS — M5137 Other intervertebral disc degeneration, lumbosacral region: Secondary | ICD-10-CM | POA: Diagnosis not present

## 2015-04-14 DIAGNOSIS — F418 Other specified anxiety disorders: Secondary | ICD-10-CM | POA: Diagnosis not present

## 2015-04-14 DIAGNOSIS — M069 Rheumatoid arthritis, unspecified: Secondary | ICD-10-CM | POA: Diagnosis not present

## 2015-04-14 DIAGNOSIS — M059 Rheumatoid arthritis with rheumatoid factor, unspecified: Secondary | ICD-10-CM | POA: Diagnosis not present

## 2015-04-14 DIAGNOSIS — G20 Parkinson's disease: Secondary | ICD-10-CM | POA: Diagnosis not present

## 2015-04-14 DIAGNOSIS — M5136 Other intervertebral disc degeneration, lumbar region: Secondary | ICD-10-CM | POA: Diagnosis not present

## 2015-04-16 DIAGNOSIS — T148 Other injury of unspecified body region: Secondary | ICD-10-CM | POA: Diagnosis not present

## 2015-04-16 DIAGNOSIS — S82202D Unspecified fracture of shaft of left tibia, subsequent encounter for closed fracture with routine healing: Secondary | ICD-10-CM | POA: Diagnosis not present

## 2015-04-16 DIAGNOSIS — G20 Parkinson's disease: Secondary | ICD-10-CM | POA: Diagnosis not present

## 2015-04-16 DIAGNOSIS — I10 Essential (primary) hypertension: Secondary | ICD-10-CM | POA: Diagnosis not present

## 2015-04-19 DIAGNOSIS — Z79899 Other long term (current) drug therapy: Secondary | ICD-10-CM | POA: Diagnosis not present

## 2015-04-19 DIAGNOSIS — T814XXD Infection following a procedure, subsequent encounter: Secondary | ICD-10-CM | POA: Diagnosis not present

## 2015-04-19 DIAGNOSIS — Z885 Allergy status to narcotic agent status: Secondary | ICD-10-CM | POA: Diagnosis not present

## 2015-04-19 DIAGNOSIS — M86272 Subacute osteomyelitis, left ankle and foot: Secondary | ICD-10-CM | POA: Diagnosis not present

## 2015-04-20 DIAGNOSIS — M869 Osteomyelitis, unspecified: Secondary | ICD-10-CM | POA: Diagnosis not present

## 2015-04-20 DIAGNOSIS — I10 Essential (primary) hypertension: Secondary | ICD-10-CM | POA: Diagnosis not present

## 2015-04-27 DIAGNOSIS — S82202A Unspecified fracture of shaft of left tibia, initial encounter for closed fracture: Secondary | ICD-10-CM | POA: Diagnosis not present

## 2015-04-27 DIAGNOSIS — G20 Parkinson's disease: Secondary | ICD-10-CM | POA: Diagnosis not present

## 2015-04-27 DIAGNOSIS — E039 Hypothyroidism, unspecified: Secondary | ICD-10-CM | POA: Diagnosis not present

## 2015-04-27 DIAGNOSIS — F0280 Dementia in other diseases classified elsewhere without behavioral disturbance: Secondary | ICD-10-CM | POA: Diagnosis not present

## 2015-05-06 DIAGNOSIS — S82202D Unspecified fracture of shaft of left tibia, subsequent encounter for closed fracture with routine healing: Secondary | ICD-10-CM | POA: Diagnosis not present

## 2015-05-06 DIAGNOSIS — S82402D Unspecified fracture of shaft of left fibula, subsequent encounter for closed fracture with routine healing: Secondary | ICD-10-CM | POA: Diagnosis not present

## 2015-05-06 DIAGNOSIS — T814XXD Infection following a procedure, subsequent encounter: Secondary | ICD-10-CM | POA: Diagnosis not present

## 2015-05-06 DIAGNOSIS — G20 Parkinson's disease: Secondary | ICD-10-CM | POA: Diagnosis not present

## 2015-05-06 DIAGNOSIS — E785 Hyperlipidemia, unspecified: Secondary | ICD-10-CM | POA: Diagnosis not present

## 2015-05-07 DIAGNOSIS — M86272 Subacute osteomyelitis, left ankle and foot: Secondary | ICD-10-CM | POA: Diagnosis not present

## 2015-05-11 DIAGNOSIS — G20 Parkinson's disease: Secondary | ICD-10-CM | POA: Diagnosis not present

## 2015-05-11 DIAGNOSIS — M0689 Other specified rheumatoid arthritis, multiple sites: Secondary | ICD-10-CM | POA: Diagnosis not present

## 2015-05-11 DIAGNOSIS — S82202D Unspecified fracture of shaft of left tibia, subsequent encounter for closed fracture with routine healing: Secondary | ICD-10-CM | POA: Diagnosis not present

## 2015-05-11 DIAGNOSIS — I10 Essential (primary) hypertension: Secondary | ICD-10-CM | POA: Diagnosis not present

## 2015-05-17 ENCOUNTER — Ambulatory Visit (INDEPENDENT_AMBULATORY_CARE_PROVIDER_SITE_OTHER): Payer: Medicare Other | Admitting: Osteopathic Medicine

## 2015-05-17 ENCOUNTER — Encounter: Payer: Self-pay | Admitting: Osteopathic Medicine

## 2015-05-17 VITALS — BP 121/64 | HR 76 | Temp 98.1°F | Wt 157.0 lb

## 2015-05-17 DIAGNOSIS — M810 Age-related osteoporosis without current pathological fracture: Secondary | ICD-10-CM | POA: Diagnosis not present

## 2015-05-17 DIAGNOSIS — G20 Parkinson's disease: Secondary | ICD-10-CM | POA: Diagnosis not present

## 2015-05-17 DIAGNOSIS — M1990 Unspecified osteoarthritis, unspecified site: Secondary | ICD-10-CM

## 2015-05-17 DIAGNOSIS — Z8659 Personal history of other mental and behavioral disorders: Secondary | ICD-10-CM

## 2015-05-17 DIAGNOSIS — R350 Frequency of micturition: Secondary | ICD-10-CM | POA: Diagnosis not present

## 2015-05-17 DIAGNOSIS — M069 Rheumatoid arthritis, unspecified: Secondary | ICD-10-CM

## 2015-05-17 DIAGNOSIS — Z79899 Other long term (current) drug therapy: Secondary | ICD-10-CM

## 2015-05-17 DIAGNOSIS — I10 Essential (primary) hypertension: Secondary | ICD-10-CM

## 2015-05-17 DIAGNOSIS — Z9181 History of falling: Secondary | ICD-10-CM

## 2015-05-17 DIAGNOSIS — K5900 Constipation, unspecified: Secondary | ICD-10-CM

## 2015-05-17 DIAGNOSIS — J302 Other seasonal allergic rhinitis: Secondary | ICD-10-CM

## 2015-05-17 DIAGNOSIS — E039 Hypothyroidism, unspecified: Secondary | ICD-10-CM | POA: Diagnosis not present

## 2015-05-17 DIAGNOSIS — E559 Vitamin D deficiency, unspecified: Secondary | ICD-10-CM

## 2015-05-17 DIAGNOSIS — R262 Difficulty in walking, not elsewhere classified: Secondary | ICD-10-CM

## 2015-05-17 DIAGNOSIS — K219 Gastro-esophageal reflux disease without esophagitis: Secondary | ICD-10-CM

## 2015-05-17 MED ORDER — SENNOSIDES-DOCUSATE SODIUM 8.6-50 MG PO TABS: 2.0000 | ORAL_TABLET | Freq: Every evening | ORAL | Status: DC | PRN

## 2015-05-17 MED ORDER — CETIRIZINE HCL 5 MG PO TABS: 5.0000 mg | ORAL_TABLET | Freq: Every day | ORAL | Status: DC | PRN

## 2015-05-17 MED ORDER — CARBIDOPA-LEVODOPA 25-100 MG PO TABS: 1.5000 | ORAL_TABLET | Freq: Every day | ORAL | Status: DC

## 2015-05-17 MED ORDER — POTASSIUM CHLORIDE 20 MEQ PO PACK: 20.0000 meq | PACK | Freq: Every day | ORAL | Status: DC

## 2015-05-17 MED ORDER — ACETAMINOPHEN 325 MG PO TABS: 650.0000 mg | ORAL_TABLET | Freq: Four times a day (QID) | ORAL | Status: AC | PRN

## 2015-05-17 MED ORDER — METHOTREXATE SODIUM 15 MG PO TABS: 15.0000 mg | ORAL_TABLET | ORAL | Status: DC

## 2015-05-17 MED ORDER — PRAMIPEXOLE DIHYDROCHLORIDE 0.5 MG PO TABS: 0.5000 mg | ORAL_TABLET | Freq: Every day | ORAL | Status: DC

## 2015-05-17 MED ORDER — LEVOTHYROXINE SODIUM 112 MCG PO TABS: 112.0000 ug | ORAL_TABLET | Freq: Every day | ORAL | Status: DC

## 2015-05-17 MED ORDER — CALCIUM-VITAMIN D 600-200 MG-UNIT PO TABS: 1.0000 | ORAL_TABLET | Freq: Every day | ORAL | Status: DC

## 2015-05-17 MED ORDER — LISINOPRIL 20 MG PO TABS: 20.0000 mg | ORAL_TABLET | Freq: Every day | ORAL | Status: DC

## 2015-05-17 MED ORDER — AMANTADINE HCL 100 MG PO CAPS: 100.0000 mg | ORAL_CAPSULE | Freq: Three times a day (TID) | ORAL | Status: DC

## 2015-05-17 MED ORDER — MIRTAZAPINE 15 MG PO TABS: 15.0000 mg | ORAL_TABLET | Freq: Every day | ORAL | Status: DC

## 2015-05-17 MED ORDER — PANTOPRAZOLE SODIUM 40 MG PO TBEC: 40.0000 mg | DELAYED_RELEASE_TABLET | Freq: Every day | ORAL | Status: DC

## 2015-05-17 MED ORDER — FOLIC ACID 1 MG PO TABS: 1.0000 mg | ORAL_TABLET | Freq: Every day | ORAL | Status: DC

## 2015-05-17 MED ORDER — ASPIRIN EC 81 MG PO TBEC: 81.0000 mg | DELAYED_RELEASE_TABLET | Freq: Every day | ORAL | Status: DC

## 2015-05-17 NOTE — Progress Notes (Signed)
HPI: Shannon Vincent is a 80 y.o. female who presents to [place]  today for chief complaint of: establish care, receent discharge from outpatient rehab  Recently [facility] (rehab) in [facility], form what I can gather from patient and family, here follows general course: 12/2014 fell and went to [facility], required L ankle surgery. Was discharged but then readmitted for headache, then back to [facility] 01/2015 , L ankle developed infection and was on IV abx in PICC for about a month and was discharged 05/16/15, daughter states dc due to payment issues, daughter has been in contact with social worker regarding this issue and states there is a concern that the patient was released/discuarged sooner than would be appropriate.   Neurologic: History of Parkinson's, daughter also reports some history of dementia, patient is on Aricept in addition to her Parkinson's medications, I reviewed neurology most recent office visit note, see below. Patient reports "feeling funny" like trouble concentrating and remembering things which has been ongoing for many months. Apparently there was also a mixup with her medications where she was not on one of her Parkinson medicines for a few weeks, she is worried that this might have something to do with her symptoms  RAX:ENMM replacement L 08/2014 (Dr. Jefferson Fuel, [place]), daughter reports patient has "Soft bones" and arthritis, daughter doesn't know about any official diagnosis of osteoporosis. Daughter reports that patient should be wearing a boot while weightbearing patient is noncompliant with this. Upcoming follow-up with orthopedics  ID: PICC places 04/16/2015, finished IV abx prior to discharge from rehab, presumably for septic arthritis.  Endocrine: History of hypothyroidism, patient reports no heat or cold intolerance, has not had TSH obtained in some time, her review of chair everywhere shows TSH done December 2015 which was  normal  GI: Has been on stool softeners that she needs these, reports frequent soft bowel movements, no diarrhea or incontinence of stool  Neurologist - Dr Tonye Royalty [facility]) appt 06/03/15 Rheumatologist - Dr Dub Mikes [facility] appt 05/28/15  NEUROLOGY - reviewed notes form last office visit Dr Tonye Royalty 09/22/2014: "Shannon Vincent is a 80 y.o. female with Parkinson's disease in 2000 s/p BL STN DBS in 07/2008 with a revision on 01/05/2010 by Dr. Salomon Fick. She presents for follow up after last being seen in June 2012. ..  Since 2014 she has had issues with freezing and falling. This happens frequently during the day. It seems to happen in both feet. Can occur any time, not just when medication gets low. Happens when she turns and feet don't follow her. Her daughter was hospitalized at [facility] with a diagnosis of CJD in 2014. Her daughter has been experiencing a decline in her health since 2012, which is obviously upsetting for [name].... Current Movement Disorder Medication:  Sinemet 25/100mg  - 1 1/2 tabs every 3 hours (6x/day) Amantadine 100mg  TID  Mirapex 0.5mg  5 times daily ... Assessment/Plan:  Shannon Vincent is a 80 y.o. female with parkinson's disease and Bilateral STN DBS. No setting changes made.   To look at whether protein effect is a reason for her medication response variability.  If no protein effect, may go to 2tabs sinemet per dose, with the understanding that there will be more dyskinesia. Pt was asked to call the clinic with any questions/concerns in the meantime."    Past medical, social and family history reviewed: Past Medical History  Diagnosis Date  . Parkinson disease     [facility]  neuro  . Osteoarthritis   . Arthritis     asteotosis  . Dermatitis     asteotosis  . Cancer    Past Surgical History  Procedure Laterality Date  . Spine surgery  2004    cervical spine fusion   . Brain surgery  09-10, 04-11    deep brain stimulator   Social History  Substance Use  Topics  . Smoking status: Never Smoker   . Smokeless tobacco: Not on file  . Alcohol Use: No   No family history on file.  Current Outpatient Prescriptions  Medication Sig Dispense Refill  . acetaminophen (TYLENOL) 325 MG tablet Take 650 mg by mouth.    Marland Kitchen aspirin EC 81 MG tablet Take 81 mg by mouth.    . Calcium-Vitamin D 600-200 MG-UNIT per tablet Take 1 tablet by mouth.    . carbidopa-levodopa (SINEMET IR) 25-100 MG per tablet Take 1 tablet by mouth.    . carvedilol (COREG) 6.25 MG tablet Take 6.25 mg by mouth.    . cetirizine (ZYRTEC) 10 MG tablet Take 10 mg by mouth.    . Cholecalciferol (VITAMIN D-1000 MAX ST) 1000 UNITS tablet Take 2,000 Units by mouth.    . cloNIDine (CATAPRES) 0.1 MG tablet Take 0.1 mg by mouth.    . cyclobenzaprine (FLEXERIL) 5 MG tablet Take 5 mg by mouth.    . folic acid (FOLVITE) 1 MG tablet Take 1 mg by mouth.    . levothyroxine (SYNTHROID, LEVOTHROID) 112 MCG tablet Take 112 mcg by mouth.    . methotrexate (RHEUMATREX) 2.5 MG tablet Take 15 mg by mouth.    . mirtazapine (REMERON) 15 MG tablet Take 15 mg by mouth.    . pantoprazole (PROTONIX) 40 MG tablet Take 40 mg by mouth.    . potassium chloride (KLOR-CON) 20 MEQ packet Take 20 mEq by mouth.    . pramipexole (MIRAPEX) 0.5 MG tablet Take 0.5 mg by mouth.    . senna-docusate (SENOKOT-S) 8.6-50 MG per tablet Take 2 tablets by mouth.    . traMADol (ULTRAM) 50 MG tablet Take 50 mg by mouth.     No current facility-administered medications for this visit.   Allergies  Allergen Reactions  . Codeine      Review of Systems: CONSTITUTIONAL: Neg fever/chills, no unintentional weight changes HEAD/EYES/EARS/NOSE/THROAT: No headache/vision change or hearing change, no sore throat CARDIAC: No chest pain/pressure/palpitations, no orthopnea RESPIRATORY: No cough/shortness of breath/wheeze GASTROINTESTINAL: No nausea/vomiting/abdominal pain/blood in stool, occasional constipation MUSCULOSKELETAL: No  myalgia/arthralgia GENITOURINARY:, No abnormal genital bleeding/discharge. (+) urinary frequency and urge incontinence SKIN: No rash/wounds/concerning lesions HEM/ONC: No easy bruising/bleeding, no abnormal lymph node ENDOCRINE: No polyuria/polydipsia/polyphagia, no heat/cold intolerance  NEUROLOGIC: No slurred speech, (+) generalized weakness and chronic tremor due to parkinsons, (+) fatigue and difficulty walking feeling off balance PSYCHIATRIC: No concerns with depression/anxiety or sleep problems    Exam:  There were no vitals taken for this visit. Constitutional: VSS, see above. General Appearance: alert, well-developed, well-nourished, NAD Eyes: Normal lids and conjunctive, non-icteric sclera, PERRLA Ears, Nose, Mouth, Throat: Normal external inspection ears/nares/mouth/lips/gums, MMM, posterior pharynx without erythema/exudate Neck: No masses, trachea midline. No thyroid enlargement/tenderness/mass appreciated Respiratory: Normal respiratory effort. No dullness/hyper-resonance to percussion. Breath sounds normal, no wheeze/rhonchi/rales Cardiovascular: S1/S2 normal, no murmur/rub/gallop auscultated. No carotid bruit or JVD. No abdominal aortic bruit. Pedal pulse II/IV bilaterally DP and PT. No lower extremity edema. Musculoskeletal: Gait not evaluated due to patient in wheelchair, feels unsteady to walk. No clubbing/cyanosis of digits.  Radial deviation fingers of L hand.  Neurological: No cranial nerve deficit on limited exam. (+) tremor, slight rigidity in arms Psychiatric: Normal judgment/insight. Normal mood and affect. Oriented x3.    No results found for this or any previous visit (from the past 72 hour(s)).    ASSESSMENT/PLAN:  Parkinson's disease - Sinemet 25/100mg  - 1 1/2 tabs every 3 hours (6x/day while awake) --> ? 2 tabs per neuroAmantadine 100mg  TID Mirapex 0.5mg  5 times daily  - Plan: carbidopa-levodopa (SINEMET IR) 25-100 MG per tablet, amantadine (SYMMETREL) 100 MG  capsule, pramipexole (MIRAPEX) 0.5 MG tablet  Ambulatory dysfunction  Rheumatoid arthritis - Plan: folic acid (FOLVITE) 1 MG tablet, methotrexate (RHEUMATREX) 15 MG tablet  Hypothyroidism, unspecified hypothyroidism type - Plan: levothyroxine (SYNTHROID, LEVOTHROID) 112 MCG tablet  At risk for falling  Osteoporosis - Plan: Calcium-Vitamin D 600-200 MG-UNIT per tablet  History of depression - Plan: mirtazapine (REMERON) 15 MG tablet  Essential hypertension - Plan: aspirin EC 81 MG tablet, lisinopril (PRINIVIL,ZESTRIL) 20 MG tablet  Constipation, unspecified constipation type - Plan: senna-docusate (SENOKOT-S) 8.6-50 MG per tablet  Urinary frequency - Plan: Urinalysis  Seasonal allergies - Plan: cetirizine (ZYRTEC) 5 MG tablet  Gastroesophageal reflux disease without esophagitis - Plan: pantoprazole (PROTONIX) 40 MG tablet  Medication management - Plan: potassium chloride (KLOR-CON) 20 MEQ packet, CBC with Differential/Platelet, COMPLETE METABOLIC PANEL WITH GFR, TSH  Arthritis - Plan: acetaminophen (TYLENOL) 325 MG tablet  Urinary frequency, Acute - Plan: Urinalysis    Patient was recently discharged from skilled nursing facility, I do not have discharge summary from this facility available to me at this time. Family states that there is concern for her medication regimen, they were not given sufficient amount of medicines and she was only discharged yesterday. There is also some concern about her Parkinson's regimen, I reviewed the notes from her most recent visit with her neurologist and reconcile these medications to that visit. She is encouraged to keep her upcoming follow-up. Multiple medical problems as noted above, patient is at high risk for decompensation and polypharmacy. Home health was ordered urgently. I advised the family that the patient should not be driving or living independently at this time and it is unlikely that she will be able to do so in the future.  Approximately 60 minutes was spent in coordination of care with this patient, including reviewing old medical records, attempting medication reconciliation, coordination of care, counseling with the patient and her family.  Notable changes to medications, in summary, have changed laxative/stool. To when necessary. I have changed antihypertensive medication to avoid beta blocker which may be exacerbating her symptoms are fatigue and memory problems and advised patient to come back in one week for recheck, will also have home health checking blood pressure (patient's family reports that the nursing home was concerned for very high and also very low blood pressure but again I do not have records available to me right now). Patient was previously on fluoxetine but is now on mirtazapine, unsure when this change to place but states she has been doing well on this new medicine, will not change it at this time though again there is some concern for TCA instead of SSRI, may consider changing back to Prozac. I had discontinued cyclobenzaprine and tramadol

## 2015-05-18 LAB — CBC WITH DIFFERENTIAL/PLATELET
BASOS ABS: 0.1 10*3/uL (ref 0.0–0.1)
Basophils Relative: 1 % (ref 0–1)
EOS PCT: 4 % (ref 0–5)
Eosinophils Absolute: 0.3 10*3/uL (ref 0.0–0.7)
HEMATOCRIT: 35.3 % — AB (ref 36.0–46.0)
HEMOGLOBIN: 11.2 g/dL — AB (ref 12.0–15.0)
LYMPHS ABS: 1.6 10*3/uL (ref 0.7–4.0)
LYMPHS PCT: 22 % (ref 12–46)
MCH: 29.9 pg (ref 26.0–34.0)
MCHC: 31.7 g/dL (ref 30.0–36.0)
MCV: 94.1 fL (ref 78.0–100.0)
MONO ABS: 0.5 10*3/uL (ref 0.1–1.0)
MPV: 9.3 fL (ref 8.6–12.4)
Monocytes Relative: 7 % (ref 3–12)
NEUTROS ABS: 4.8 10*3/uL (ref 1.7–7.7)
Neutrophils Relative %: 66 % (ref 43–77)
Platelets: 359 10*3/uL (ref 150–400)
RBC: 3.75 MIL/uL — ABNORMAL LOW (ref 3.87–5.11)
RDW: 16.1 % — AB (ref 11.5–15.5)
WBC: 7.3 10*3/uL (ref 4.0–10.5)

## 2015-05-18 LAB — URINALYSIS
BILIRUBIN URINE: NEGATIVE
GLUCOSE, UA: NEGATIVE
HGB URINE DIPSTICK: NEGATIVE
KETONES UR: NEGATIVE
Nitrite: NEGATIVE
PROTEIN: NEGATIVE
Specific Gravity, Urine: 1.017 (ref 1.001–1.035)
pH: 7 (ref 5.0–8.0)

## 2015-05-18 LAB — COMPLETE METABOLIC PANEL WITH GFR
ALBUMIN: 4.2 g/dL (ref 3.6–5.1)
ALK PHOS: 100 U/L (ref 33–130)
ALT: 4 U/L — AB (ref 6–29)
AST: 10 U/L (ref 10–35)
BUN: 19 mg/dL (ref 7–25)
CALCIUM: 9.4 mg/dL (ref 8.6–10.4)
CHLORIDE: 105 mmol/L (ref 98–110)
CO2: 27 mmol/L (ref 20–31)
CREATININE: 0.93 mg/dL (ref 0.60–0.93)
GFR, EST AFRICAN AMERICAN: 69 mL/min (ref >=60)
GFR, Est Non African American: 59 mL/min — ABNORMAL LOW (ref >=60)
Glucose, Bld: 89 mg/dL (ref 65–99)
Potassium: 4.8 mmol/L (ref 3.5–5.3)
Sodium: 143 mmol/L (ref 135–146)
Total Bilirubin: 0.4 mg/dL (ref 0.2–1.2)
Total Protein: 7 g/dL (ref 6.1–8.1)

## 2015-05-18 LAB — TSH: TSH: 5.815 u[IU]/mL — AB (ref 0.350–4.500)

## 2015-05-18 LAB — VITAMIN D 25 HYDROXY (VIT D DEFICIENCY, FRACTURES): VIT D 25 HYDROXY: 26 ng/mL — AB (ref 30–100)

## 2015-05-18 MED ORDER — LEVOTHYROXINE SODIUM 125 MCG PO TABS: 125.0000 ug | ORAL_TABLET | Freq: Every day | ORAL | Status: DC

## 2015-05-18 MED ORDER — VITAMIN D (ERGOCALCIFEROL) 1.25 MG (50000 UNIT) PO CAPS: 50000.0000 [IU] | ORAL_CAPSULE | ORAL | Status: DC

## 2015-05-18 NOTE — Addendum Note (Signed)
Addended by: Maryla Morrow on: 05/18/2015 09:38 AM   Modules accepted: Orders

## 2015-05-19 DIAGNOSIS — R41841 Cognitive communication deficit: Secondary | ICD-10-CM | POA: Diagnosis not present

## 2015-05-19 DIAGNOSIS — M069 Rheumatoid arthritis, unspecified: Secondary | ICD-10-CM | POA: Diagnosis not present

## 2015-05-19 DIAGNOSIS — Z8781 Personal history of (healed) traumatic fracture: Secondary | ICD-10-CM | POA: Diagnosis not present

## 2015-05-19 DIAGNOSIS — R2689 Other abnormalities of gait and mobility: Secondary | ICD-10-CM | POA: Diagnosis not present

## 2015-05-19 DIAGNOSIS — Z9181 History of falling: Secondary | ICD-10-CM | POA: Diagnosis not present

## 2015-05-19 DIAGNOSIS — M6281 Muscle weakness (generalized): Secondary | ICD-10-CM | POA: Diagnosis not present

## 2015-05-19 DIAGNOSIS — F329 Major depressive disorder, single episode, unspecified: Secondary | ICD-10-CM | POA: Diagnosis not present

## 2015-05-19 DIAGNOSIS — I10 Essential (primary) hypertension: Secondary | ICD-10-CM | POA: Diagnosis not present

## 2015-05-19 DIAGNOSIS — G20 Parkinson's disease: Secondary | ICD-10-CM | POA: Diagnosis not present

## 2015-05-19 LAB — URINE CULTURE: Colony Count: >=100000

## 2015-05-20 ENCOUNTER — Telehealth: Payer: Self-pay | Admitting: Osteopathic Medicine

## 2015-05-20 DIAGNOSIS — F329 Major depressive disorder, single episode, unspecified: Secondary | ICD-10-CM | POA: Diagnosis not present

## 2015-05-20 DIAGNOSIS — R2689 Other abnormalities of gait and mobility: Secondary | ICD-10-CM | POA: Diagnosis not present

## 2015-05-20 DIAGNOSIS — M069 Rheumatoid arthritis, unspecified: Secondary | ICD-10-CM | POA: Diagnosis not present

## 2015-05-20 DIAGNOSIS — G20 Parkinson's disease: Secondary | ICD-10-CM

## 2015-05-20 DIAGNOSIS — Z8781 Personal history of (healed) traumatic fracture: Secondary | ICD-10-CM | POA: Diagnosis not present

## 2015-05-20 DIAGNOSIS — R262 Difficulty in walking, not elsewhere classified: Secondary | ICD-10-CM

## 2015-05-20 DIAGNOSIS — I10 Essential (primary) hypertension: Secondary | ICD-10-CM | POA: Diagnosis not present

## 2015-05-20 NOTE — Telephone Encounter (Signed)
Needs rx for hospital bed (home health requests this)

## 2015-05-21 DIAGNOSIS — G20 Parkinson's disease: Secondary | ICD-10-CM | POA: Diagnosis not present

## 2015-05-21 DIAGNOSIS — I10 Essential (primary) hypertension: Secondary | ICD-10-CM | POA: Diagnosis not present

## 2015-05-21 DIAGNOSIS — R2689 Other abnormalities of gait and mobility: Secondary | ICD-10-CM | POA: Diagnosis not present

## 2015-05-21 DIAGNOSIS — F329 Major depressive disorder, single episode, unspecified: Secondary | ICD-10-CM | POA: Diagnosis not present

## 2015-05-21 DIAGNOSIS — M069 Rheumatoid arthritis, unspecified: Secondary | ICD-10-CM | POA: Diagnosis not present

## 2015-05-21 DIAGNOSIS — Z8781 Personal history of (healed) traumatic fracture: Secondary | ICD-10-CM | POA: Diagnosis not present

## 2015-05-21 NOTE — Progress Notes (Signed)
This patient - due to Parkinson's, debility, ambulatory dysfunction - requires positioning of the body is not feasible with ordinary bed in order to alleviate pain, requiring elevation of the head/body greater than 30

## 2015-05-25 DIAGNOSIS — F329 Major depressive disorder, single episode, unspecified: Secondary | ICD-10-CM | POA: Diagnosis not present

## 2015-05-25 DIAGNOSIS — G20 Parkinson's disease: Secondary | ICD-10-CM | POA: Diagnosis not present

## 2015-05-25 DIAGNOSIS — I10 Essential (primary) hypertension: Secondary | ICD-10-CM | POA: Diagnosis not present

## 2015-05-25 DIAGNOSIS — R2689 Other abnormalities of gait and mobility: Secondary | ICD-10-CM | POA: Diagnosis not present

## 2015-05-25 DIAGNOSIS — Z8781 Personal history of (healed) traumatic fracture: Secondary | ICD-10-CM | POA: Diagnosis not present

## 2015-05-25 DIAGNOSIS — M069 Rheumatoid arthritis, unspecified: Secondary | ICD-10-CM | POA: Diagnosis not present

## 2015-05-26 DIAGNOSIS — G20 Parkinson's disease: Secondary | ICD-10-CM | POA: Diagnosis not present

## 2015-05-26 DIAGNOSIS — Z8781 Personal history of (healed) traumatic fracture: Secondary | ICD-10-CM | POA: Diagnosis not present

## 2015-05-26 DIAGNOSIS — I10 Essential (primary) hypertension: Secondary | ICD-10-CM | POA: Diagnosis not present

## 2015-05-26 DIAGNOSIS — M069 Rheumatoid arthritis, unspecified: Secondary | ICD-10-CM | POA: Diagnosis not present

## 2015-05-26 DIAGNOSIS — F329 Major depressive disorder, single episode, unspecified: Secondary | ICD-10-CM | POA: Diagnosis not present

## 2015-05-26 DIAGNOSIS — R2689 Other abnormalities of gait and mobility: Secondary | ICD-10-CM | POA: Diagnosis not present

## 2015-05-27 ENCOUNTER — Telehealth: Payer: Self-pay | Admitting: Emergency Medicine

## 2015-05-27 DIAGNOSIS — I10 Essential (primary) hypertension: Secondary | ICD-10-CM | POA: Diagnosis not present

## 2015-05-27 DIAGNOSIS — M069 Rheumatoid arthritis, unspecified: Secondary | ICD-10-CM | POA: Diagnosis not present

## 2015-05-27 DIAGNOSIS — R2689 Other abnormalities of gait and mobility: Secondary | ICD-10-CM | POA: Diagnosis not present

## 2015-05-27 DIAGNOSIS — Z8781 Personal history of (healed) traumatic fracture: Secondary | ICD-10-CM | POA: Diagnosis not present

## 2015-05-27 DIAGNOSIS — F329 Major depressive disorder, single episode, unspecified: Secondary | ICD-10-CM | POA: Diagnosis not present

## 2015-05-27 DIAGNOSIS — G20 Parkinson's disease: Secondary | ICD-10-CM | POA: Diagnosis not present

## 2015-05-27 NOTE — Telephone Encounter (Signed)
Hubbard Robinson, OT (phone # [phone]) called to request order for "skilled care OT once a week x 5 weeks; and for a shower chair. Please fax to [phone]. PAK

## 2015-05-27 NOTE — Telephone Encounter (Signed)
Nancy Hickman called back to clarify: only needs verbal order for OT once week x 5 weeks. The shower chair order must be faxed to a different corrected number: [phone]. PAK

## 2015-05-28 ENCOUNTER — Encounter: Payer: Self-pay | Admitting: Osteopathic Medicine

## 2015-05-28 ENCOUNTER — Ambulatory Visit (INDEPENDENT_AMBULATORY_CARE_PROVIDER_SITE_OTHER): Payer: Medicare Other | Admitting: Osteopathic Medicine

## 2015-05-28 VITALS — BP 125/80 | HR 87

## 2015-05-28 DIAGNOSIS — R2689 Other abnormalities of gait and mobility: Secondary | ICD-10-CM | POA: Diagnosis not present

## 2015-05-28 DIAGNOSIS — M810 Age-related osteoporosis without current pathological fracture: Secondary | ICD-10-CM

## 2015-05-28 DIAGNOSIS — M069 Rheumatoid arthritis, unspecified: Secondary | ICD-10-CM

## 2015-05-28 DIAGNOSIS — E039 Hypothyroidism, unspecified: Secondary | ICD-10-CM

## 2015-05-28 DIAGNOSIS — E559 Vitamin D deficiency, unspecified: Secondary | ICD-10-CM

## 2015-05-28 DIAGNOSIS — R262 Difficulty in walking, not elsewhere classified: Secondary | ICD-10-CM

## 2015-05-28 DIAGNOSIS — G20 Parkinson's disease: Secondary | ICD-10-CM | POA: Diagnosis not present

## 2015-05-28 DIAGNOSIS — Z23 Encounter for immunization: Secondary | ICD-10-CM | POA: Diagnosis not present

## 2015-05-28 DIAGNOSIS — Z79899 Other long term (current) drug therapy: Secondary | ICD-10-CM

## 2015-05-28 DIAGNOSIS — F329 Major depressive disorder, single episode, unspecified: Secondary | ICD-10-CM | POA: Diagnosis not present

## 2015-05-28 DIAGNOSIS — I10 Essential (primary) hypertension: Secondary | ICD-10-CM | POA: Diagnosis not present

## 2015-05-28 DIAGNOSIS — Z8781 Personal history of (healed) traumatic fracture: Secondary | ICD-10-CM | POA: Diagnosis not present

## 2015-05-28 DIAGNOSIS — D649 Anemia, unspecified: Secondary | ICD-10-CM

## 2015-05-28 MED ORDER — TETANUS-DIPHTH-ACELL PERTUSSIS 5-2.5-18.5 LF-MCG/0.5 IM SUSP
0.5000 mL | Freq: Once | INTRAMUSCULAR | Status: AC
  Administered 2015-05-28: 0.5 mL via INTRAMUSCULAR

## 2015-05-28 MED ORDER — AMBULATORY NON FORMULARY MEDICATION: Status: AC

## 2015-05-28 NOTE — Progress Notes (Signed)
HPI: Shannon Vincent is a 80 y.o. female who presents to [place]  today for chief complaint of:  Chief Complaint  Patient presents with  . Follow-up   PARKINSONS - stable, pt scheduled to followup with neurology next week.  AMBULATORY DYSFUNCTION - improved, using walker, participating in home PT ANEMIA - new, see below VITAMIN D DEFICIENCY - new, see below THYROID - medicines recently adjusted, see below  Family had some questions about medication management. Sinemet should be kept at 6 times per day, this is based on neurologist orders. Parkinson's disease is stable. Ambulatory dysfunction is improved, patient is ambulating with rolling walker, participating in home physical therapy. Labs reviewed with the patient and family, anemia, FOBT recommended the patient declines at this time. Recent changed to thyroid medication dose, recent addition of vitamin D supplementation. Patient has had no adverse reactions to these medicines   Past medical, social and family history reviewed: Past Medical History  Diagnosis Date  . Parkinson disease     [facility]  . Osteoarthritis   . Arthritis     asteotosis  . Dermatitis     asteotosis  . Cancer    Past Surgical History  Procedure Laterality Date  . Spine surgery  2004    cervical spine fusion   . Brain surgery  09-10, 04-11    deep brain stimulator   Social History  Substance Use Topics  . Smoking status: Never Smoker   . Smokeless tobacco: Not on file  . Alcohol Use: No   No family history on file.  Current Outpatient Prescriptions  Medication Sig Dispense Refill  . acetaminophen (TYLENOL) 325 MG tablet Take 2 tablets (650 mg total) by mouth every 6 (six) hours as needed for mild pain, moderate pain, fever or headache. 90 tablet 1  . amantadine (SYMMETREL) 100 MG capsule Take 1 capsule (100 mg total) by mouth 3 (three) times daily. 180 capsule 3  . AMBULATORY NON FORMULARY MEDICATION Shower Chair  - Diagnosis - Parkinson's disease ICD-10 G20 1 each 0  . aspirin EC 81 MG tablet Take 1 tablet (81 mg total) by mouth daily. 30 tablet 2  . Calcium-Vitamin D 600-200 MG-UNIT per tablet Take 1 tablet by mouth daily. 30 tablet 2  . carbidopa-levodopa (SINEMET IR) 25-100 MG per tablet Take 1.5 tablets by mouth 6 (six) times daily. 250 tablet 1  . cetirizine (ZYRTEC) 5 MG tablet Take 1 tablet (5 mg total) by mouth daily as needed for allergies or rhinitis. 30 tablet 1  . Cholecalciferol (VITAMIN D-1000 MAX ST) 1000 UNITS tablet Take 2,000 Units by mouth.    . folic acid (FOLVITE) 1 MG tablet Take 1 tablet (1 mg total) by mouth daily. 30 tablet 1  . levothyroxine (SYNTHROID, LEVOTHROID) 125 MCG tablet Take 1 tablet (125 mcg total) by mouth daily before breakfast. 30 tablet 2  . lisinopril (PRINIVIL,ZESTRIL) 20 MG tablet Take 1 tablet (20 mg total) by mouth daily. 90 tablet 3  . methotrexate (RHEUMATREX) 15 MG tablet Take 1 tablet (15 mg total) by mouth once a week. 4 tablet 1  . mirtazapine (REMERON) 15 MG tablet Take 1 tablet (15 mg total) by mouth at bedtime. 30 tablet 1  . pantoprazole (PROTONIX) 40 MG tablet Take 1 tablet (40 mg total) by mouth daily. 30 tablet 1  . potassium chloride (KLOR-CON) 20 MEQ packet Take 20 mEq by mouth daily. 30 packet 1  . pramipexole (MIRAPEX) 0.5 MG tablet Take  1 tablet (0.5 mg total) by mouth 5 (five) times daily. 150 tablet 2  . senna-docusate (SENOKOT-S) 8.6-50 MG per tablet Take 2 tablets by mouth at bedtime as needed for mild constipation. 30 tablet 1  . Vitamin D, Ergocalciferol, (DRISDOL) 50000 UNITS CAPS capsule Take 1 capsule (50,000 Units total) by mouth every 7 (seven) days. 8 capsule 1   No current facility-administered medications for this visit.   Allergies  Allergen Reactions  . Codeine Nausea And Vomiting  . Morphine Diarrhea and Nausea And Vomiting  . Latex Rash      Review of Systems: CONSTITUTIONAL: Neg fever/chills, no unintentional weight  changes HEAD/EYES/EARS/NOSE/THROAT: No headache/vision change or hearing change, no sore throat CARDIAC: No chest pain/pressure/palpitations, no orthopnea RESPIRATORY: No cough/shortness of breath/wheeze GASTROINTESTINAL: No nausea/vomiting/abdominal pain/blood in stool/diarrhea/constipation, no incontinence MUSCULOSKELETAL: No myalgia/arthralgia, doing well in PT GENITOURINARY: No incontinence, No abnormal genital bleeding/discharge  NEUROLOGIC: No new weakness/dizzines/slurred speech. Strength better with PT    Exam:  BP 125/80 mmHg  Pulse 87  SpO2 93% Constitutional: VSS, see above. General Appearance: alert, well-developed, well-nourished, NAD Musculoskeletal: Patient ambulating easily with use of walker. Psychiatric: Normal judgment/insight. Normal mood and affect. Oriented x3.    No results found for this or any previous visit (from the past 72 hour(s)).    ASSESSMENT/PLAN:  Parkinson disease - Upcoming appointment with neurology  Ambulatory dysfunction - Home physical therapy is going well. Forms filled out for patient to apply for assisted living  Rheumatoid arthritis  Osteoporosis  Vitamin D deficiency - Recently started on supplementation, we'll recheck labs in 8 weeks  Anemia, unspecified anemia type - Recheck labs in 8 weeks, patient declines further workup at this time  Hypothyroidism, unspecified hypothyroidism type - Recheck labs in 8 weeks, recent dose adjustment.  Medication management - Medications reviewed with the patient and the family, no additional adjustments today.  Need for Tdap vaccination - Requests this due to granddaughter having a baby soon.   We'll follow up in 8 weeks for repeat blood work as above. Paperwork was completed today, family is applying for patient to get into assisted living facility. Any additional questions or concerns, follow up in clinic.

## 2015-05-28 NOTE — Telephone Encounter (Signed)
What do I need to sign to get this done? Thanks!

## 2015-05-28 NOTE — Telephone Encounter (Signed)
Faxed order

## 2015-05-28 NOTE — Patient Instructions (Signed)
please follow up in the office please follow up for an appointment in 6-8 weeks for repeat blood work to follow anemia, thyroid, vitamin D. Any questions or concerns in the meantime please do not hesitate to schedule an appointment to see Dr. Sheppard Coil.

## 2015-05-31 DIAGNOSIS — R2689 Other abnormalities of gait and mobility: Secondary | ICD-10-CM | POA: Diagnosis not present

## 2015-05-31 DIAGNOSIS — I10 Essential (primary) hypertension: Secondary | ICD-10-CM | POA: Diagnosis not present

## 2015-05-31 DIAGNOSIS — M069 Rheumatoid arthritis, unspecified: Secondary | ICD-10-CM | POA: Diagnosis not present

## 2015-05-31 DIAGNOSIS — Z8781 Personal history of (healed) traumatic fracture: Secondary | ICD-10-CM | POA: Diagnosis not present

## 2015-05-31 DIAGNOSIS — G20 Parkinson's disease: Secondary | ICD-10-CM | POA: Diagnosis not present

## 2015-05-31 DIAGNOSIS — F329 Major depressive disorder, single episode, unspecified: Secondary | ICD-10-CM | POA: Diagnosis not present

## 2015-06-02 DIAGNOSIS — R2689 Other abnormalities of gait and mobility: Secondary | ICD-10-CM | POA: Diagnosis not present

## 2015-06-02 DIAGNOSIS — M069 Rheumatoid arthritis, unspecified: Secondary | ICD-10-CM | POA: Diagnosis not present

## 2015-06-02 DIAGNOSIS — Z8781 Personal history of (healed) traumatic fracture: Secondary | ICD-10-CM | POA: Diagnosis not present

## 2015-06-02 DIAGNOSIS — I10 Essential (primary) hypertension: Secondary | ICD-10-CM | POA: Diagnosis not present

## 2015-06-02 DIAGNOSIS — F329 Major depressive disorder, single episode, unspecified: Secondary | ICD-10-CM | POA: Diagnosis not present

## 2015-06-02 DIAGNOSIS — G20 Parkinson's disease: Secondary | ICD-10-CM | POA: Diagnosis not present

## 2015-06-03 DIAGNOSIS — G20 Parkinson's disease: Secondary | ICD-10-CM | POA: Diagnosis not present

## 2015-06-03 DIAGNOSIS — I10 Essential (primary) hypertension: Secondary | ICD-10-CM | POA: Diagnosis not present

## 2015-06-03 DIAGNOSIS — Z7982 Long term (current) use of aspirin: Secondary | ICD-10-CM | POA: Diagnosis not present

## 2015-06-03 DIAGNOSIS — Z8781 Personal history of (healed) traumatic fracture: Secondary | ICD-10-CM | POA: Diagnosis not present

## 2015-06-03 DIAGNOSIS — M069 Rheumatoid arthritis, unspecified: Secondary | ICD-10-CM | POA: Diagnosis not present

## 2015-06-03 DIAGNOSIS — Z885 Allergy status to narcotic agent status: Secondary | ICD-10-CM | POA: Diagnosis not present

## 2015-06-03 DIAGNOSIS — E785 Hyperlipidemia, unspecified: Secondary | ICD-10-CM | POA: Diagnosis not present

## 2015-06-03 DIAGNOSIS — R2689 Other abnormalities of gait and mobility: Secondary | ICD-10-CM | POA: Diagnosis not present

## 2015-06-03 DIAGNOSIS — Z79899 Other long term (current) drug therapy: Secondary | ICD-10-CM | POA: Diagnosis not present

## 2015-06-03 DIAGNOSIS — Z7189 Other specified counseling: Secondary | ICD-10-CM | POA: Diagnosis not present

## 2015-06-03 DIAGNOSIS — F329 Major depressive disorder, single episode, unspecified: Secondary | ICD-10-CM | POA: Diagnosis not present

## 2015-06-04 DIAGNOSIS — G20 Parkinson's disease: Secondary | ICD-10-CM | POA: Diagnosis not present

## 2015-06-04 DIAGNOSIS — Z8781 Personal history of (healed) traumatic fracture: Secondary | ICD-10-CM | POA: Diagnosis not present

## 2015-06-04 DIAGNOSIS — M069 Rheumatoid arthritis, unspecified: Secondary | ICD-10-CM | POA: Diagnosis not present

## 2015-06-04 DIAGNOSIS — F329 Major depressive disorder, single episode, unspecified: Secondary | ICD-10-CM | POA: Diagnosis not present

## 2015-06-04 DIAGNOSIS — R2689 Other abnormalities of gait and mobility: Secondary | ICD-10-CM | POA: Diagnosis not present

## 2015-06-04 DIAGNOSIS — I10 Essential (primary) hypertension: Secondary | ICD-10-CM | POA: Diagnosis not present

## 2015-06-05 DIAGNOSIS — G20 Parkinson's disease: Secondary | ICD-10-CM | POA: Diagnosis not present

## 2015-06-05 DIAGNOSIS — M069 Rheumatoid arthritis, unspecified: Secondary | ICD-10-CM | POA: Diagnosis not present

## 2015-06-05 DIAGNOSIS — R2689 Other abnormalities of gait and mobility: Secondary | ICD-10-CM | POA: Diagnosis not present

## 2015-06-05 DIAGNOSIS — Z8781 Personal history of (healed) traumatic fracture: Secondary | ICD-10-CM | POA: Diagnosis not present

## 2015-06-05 DIAGNOSIS — I10 Essential (primary) hypertension: Secondary | ICD-10-CM | POA: Diagnosis not present

## 2015-06-05 DIAGNOSIS — F329 Major depressive disorder, single episode, unspecified: Secondary | ICD-10-CM | POA: Diagnosis not present

## 2015-06-07 DIAGNOSIS — M069 Rheumatoid arthritis, unspecified: Secondary | ICD-10-CM | POA: Diagnosis not present

## 2015-06-07 DIAGNOSIS — G20 Parkinson's disease: Secondary | ICD-10-CM | POA: Diagnosis not present

## 2015-06-07 DIAGNOSIS — I10 Essential (primary) hypertension: Secondary | ICD-10-CM | POA: Diagnosis not present

## 2015-06-07 DIAGNOSIS — F329 Major depressive disorder, single episode, unspecified: Secondary | ICD-10-CM | POA: Diagnosis not present

## 2015-06-07 DIAGNOSIS — R2689 Other abnormalities of gait and mobility: Secondary | ICD-10-CM | POA: Diagnosis not present

## 2015-06-07 DIAGNOSIS — Z8781 Personal history of (healed) traumatic fracture: Secondary | ICD-10-CM | POA: Diagnosis not present

## 2015-06-08 ENCOUNTER — Telehealth: Payer: Self-pay | Admitting: Osteopathic Medicine

## 2015-06-08 DIAGNOSIS — F329 Major depressive disorder, single episode, unspecified: Secondary | ICD-10-CM | POA: Diagnosis not present

## 2015-06-08 DIAGNOSIS — M069 Rheumatoid arthritis, unspecified: Secondary | ICD-10-CM | POA: Diagnosis not present

## 2015-06-08 DIAGNOSIS — I10 Essential (primary) hypertension: Secondary | ICD-10-CM | POA: Diagnosis not present

## 2015-06-08 DIAGNOSIS — Z8781 Personal history of (healed) traumatic fracture: Secondary | ICD-10-CM | POA: Diagnosis not present

## 2015-06-08 DIAGNOSIS — R2689 Other abnormalities of gait and mobility: Secondary | ICD-10-CM | POA: Diagnosis not present

## 2015-06-08 DIAGNOSIS — G20 Parkinson's disease: Secondary | ICD-10-CM | POA: Diagnosis not present

## 2015-06-08 MED ORDER — ASPIRIN EC 81 MG PO TBEC: 81.0000 mg | DELAYED_RELEASE_TABLET | Freq: Every day | ORAL | Status: AC

## 2015-06-08 NOTE — Telephone Encounter (Signed)
Please call pt: I have refilled aspirin

## 2015-06-08 NOTE — Telephone Encounter (Signed)
Shannon Vincent daughter called her mom is out of her RX  81 mg aspirin. And is wondering what to do?

## 2015-06-08 NOTE — Telephone Encounter (Signed)
Spoke with Mrs Shannon Vincent about RX for 81 mg aspirin has been sent.

## 2015-06-08 NOTE — Telephone Encounter (Signed)
Direice, Daughter called. Her Mom's rx for Aspirin 81 mg has no refills and she wants to know what to do next. She's going to the pharmacy in the next day or two.

## 2015-06-09 DIAGNOSIS — G20 Parkinson's disease: Secondary | ICD-10-CM | POA: Diagnosis not present

## 2015-06-09 DIAGNOSIS — F329 Major depressive disorder, single episode, unspecified: Secondary | ICD-10-CM | POA: Diagnosis not present

## 2015-06-09 DIAGNOSIS — R2689 Other abnormalities of gait and mobility: Secondary | ICD-10-CM | POA: Diagnosis not present

## 2015-06-09 DIAGNOSIS — M069 Rheumatoid arthritis, unspecified: Secondary | ICD-10-CM | POA: Diagnosis not present

## 2015-06-09 DIAGNOSIS — Z8781 Personal history of (healed) traumatic fracture: Secondary | ICD-10-CM | POA: Diagnosis not present

## 2015-06-09 DIAGNOSIS — I10 Essential (primary) hypertension: Secondary | ICD-10-CM | POA: Diagnosis not present

## 2015-06-10 DIAGNOSIS — Z8781 Personal history of (healed) traumatic fracture: Secondary | ICD-10-CM | POA: Diagnosis not present

## 2015-06-10 DIAGNOSIS — M069 Rheumatoid arthritis, unspecified: Secondary | ICD-10-CM | POA: Diagnosis not present

## 2015-06-10 DIAGNOSIS — R2689 Other abnormalities of gait and mobility: Secondary | ICD-10-CM | POA: Diagnosis not present

## 2015-06-10 DIAGNOSIS — I10 Essential (primary) hypertension: Secondary | ICD-10-CM | POA: Diagnosis not present

## 2015-06-10 DIAGNOSIS — F329 Major depressive disorder, single episode, unspecified: Secondary | ICD-10-CM | POA: Diagnosis not present

## 2015-06-10 DIAGNOSIS — G20 Parkinson's disease: Secondary | ICD-10-CM | POA: Diagnosis not present

## 2015-06-11 DIAGNOSIS — I10 Essential (primary) hypertension: Secondary | ICD-10-CM | POA: Diagnosis not present

## 2015-06-11 DIAGNOSIS — M069 Rheumatoid arthritis, unspecified: Secondary | ICD-10-CM | POA: Diagnosis not present

## 2015-06-11 DIAGNOSIS — F329 Major depressive disorder, single episode, unspecified: Secondary | ICD-10-CM | POA: Diagnosis not present

## 2015-06-11 DIAGNOSIS — Z8781 Personal history of (healed) traumatic fracture: Secondary | ICD-10-CM | POA: Diagnosis not present

## 2015-06-11 DIAGNOSIS — G20 Parkinson's disease: Secondary | ICD-10-CM | POA: Diagnosis not present

## 2015-06-11 DIAGNOSIS — R2689 Other abnormalities of gait and mobility: Secondary | ICD-10-CM | POA: Diagnosis not present

## 2015-06-14 DIAGNOSIS — M069 Rheumatoid arthritis, unspecified: Secondary | ICD-10-CM | POA: Diagnosis not present

## 2015-06-14 DIAGNOSIS — G20 Parkinson's disease: Secondary | ICD-10-CM | POA: Diagnosis not present

## 2015-06-14 DIAGNOSIS — R2689 Other abnormalities of gait and mobility: Secondary | ICD-10-CM | POA: Diagnosis not present

## 2015-06-14 DIAGNOSIS — F329 Major depressive disorder, single episode, unspecified: Secondary | ICD-10-CM | POA: Diagnosis not present

## 2015-06-14 DIAGNOSIS — I10 Essential (primary) hypertension: Secondary | ICD-10-CM | POA: Diagnosis not present

## 2015-06-14 DIAGNOSIS — Z8781 Personal history of (healed) traumatic fracture: Secondary | ICD-10-CM | POA: Diagnosis not present

## 2015-06-15 DIAGNOSIS — R2689 Other abnormalities of gait and mobility: Secondary | ICD-10-CM | POA: Diagnosis not present

## 2015-06-15 DIAGNOSIS — I10 Essential (primary) hypertension: Secondary | ICD-10-CM | POA: Diagnosis not present

## 2015-06-15 DIAGNOSIS — M069 Rheumatoid arthritis, unspecified: Secondary | ICD-10-CM | POA: Diagnosis not present

## 2015-06-15 DIAGNOSIS — F329 Major depressive disorder, single episode, unspecified: Secondary | ICD-10-CM | POA: Diagnosis not present

## 2015-06-15 DIAGNOSIS — G20 Parkinson's disease: Secondary | ICD-10-CM | POA: Diagnosis not present

## 2015-06-15 DIAGNOSIS — Z8781 Personal history of (healed) traumatic fracture: Secondary | ICD-10-CM | POA: Diagnosis not present

## 2015-06-16 ENCOUNTER — Ambulatory Visit (HOSPITAL_BASED_OUTPATIENT_CLINIC_OR_DEPARTMENT_OTHER)
Admission: RE | Admit: 2015-06-16 | Discharge: 2015-06-16 | Disposition: A | Discharge status: Home / Self Care | Payer: Medicare Other | Source: Ambulatory Visit | Attending: Osteopathic Medicine | Admitting: Osteopathic Medicine

## 2015-06-16 ENCOUNTER — Encounter: Payer: Self-pay | Admitting: Osteopathic Medicine

## 2015-06-16 ENCOUNTER — Ambulatory Visit (INDEPENDENT_AMBULATORY_CARE_PROVIDER_SITE_OTHER): Payer: Medicare Other | Admitting: Osteopathic Medicine

## 2015-06-16 ENCOUNTER — Other Ambulatory Visit: Payer: Self-pay | Admitting: Osteopathic Medicine

## 2015-06-16 VITALS — BP 130/69 | HR 65 | Wt 158.0 lb

## 2015-06-16 DIAGNOSIS — R2242 Localized swelling, mass and lump, left lower limb: Secondary | ICD-10-CM | POA: Diagnosis not present

## 2015-06-16 DIAGNOSIS — F329 Major depressive disorder, single episode, unspecified: Secondary | ICD-10-CM | POA: Diagnosis not present

## 2015-06-16 DIAGNOSIS — M25472 Effusion, left ankle: Secondary | ICD-10-CM

## 2015-06-16 DIAGNOSIS — M722 Plantar fascial fibromatosis: Secondary | ICD-10-CM | POA: Diagnosis not present

## 2015-06-16 DIAGNOSIS — M7989 Other specified soft tissue disorders: Secondary | ICD-10-CM | POA: Diagnosis not present

## 2015-06-16 DIAGNOSIS — R2689 Other abnormalities of gait and mobility: Secondary | ICD-10-CM | POA: Diagnosis not present

## 2015-06-16 DIAGNOSIS — M25572 Pain in left ankle and joints of left foot: Secondary | ICD-10-CM | POA: Diagnosis not present

## 2015-06-16 DIAGNOSIS — Z9889 Other specified postprocedural states: Secondary | ICD-10-CM | POA: Diagnosis not present

## 2015-06-16 DIAGNOSIS — R600 Localized edema: Secondary | ICD-10-CM | POA: Diagnosis not present

## 2015-06-16 DIAGNOSIS — I10 Essential (primary) hypertension: Secondary | ICD-10-CM | POA: Diagnosis not present

## 2015-06-16 DIAGNOSIS — G20 Parkinson's disease: Secondary | ICD-10-CM | POA: Diagnosis not present

## 2015-06-16 DIAGNOSIS — Z8781 Personal history of (healed) traumatic fracture: Secondary | ICD-10-CM | POA: Diagnosis not present

## 2015-06-16 DIAGNOSIS — M069 Rheumatoid arthritis, unspecified: Secondary | ICD-10-CM | POA: Diagnosis not present

## 2015-06-16 NOTE — Patient Instructions (Addendum)
YOU NEED TO LET YOUR SURGEON KNOW YOU ARE HAVING SWELLING AND PAIN IN THIS ANKLE.   WE WILL GET X-RAYS TO LOOK AT THE BONE,  WE WILL GET AN ULTRASOUND TO LOOK FOR POSSIBLE BLOOD CLOT,  WHICH IS UNLIKELY BUT MUST BE INVESTIGATED.   IF THESE TESTS ARE NORMAL,  AND YOU ARE NOTE FEELING BETTER AFTER KEEPING YOUR ANKLE IN YOUR BOOT,  PLEASE FOLLOW UP WITH YOUR SURGEON ASAP.     RICE: Routine Care for Injuries The routine care of many injuries includes Rest, Ice, Compression, and Elevation (RICE). HOME CARE INSTRUCTIONS  Rest is needed to allow your body to heal. Routine activities can usually be resumed when comfortable. Injured tendons and bones can take up to 6 weeks to heal. Tendons are the cord-like structures that attach muscle to bone.  Ice following an injury helps keep the swelling down and reduces pain.  Put ice in a plastic bag.  Place a towel between your skin and the bag.  Leave the ice on for 15-20 minutes, 3-4 times a day, or as directed by your health care provider. Do this while awake, for the first 24 to 48 hours. After that, continue as directed by your caregiver.  Compression helps keep swelling down. It also gives support and helps with discomfort. If an elastic bandage has been applied, it should be removed and reapplied every 3 to 4 hours. It should not be applied tightly, but firmly enough to keep swelling down. Watch fingers or toes for swelling, bluish discoloration, coldness, numbness, or excessive pain. If any of these problems occur, remove the bandage and reapply loosely. Contact your caregiver if these problems continue.  Elevation helps reduce swelling and decreases pain. With extremities, such as the arms, hands, legs, and feet, the injured area should be placed near or above the level of the heart, if possible. SEEK IMMEDIATE MEDICAL CARE IF:  You have persistent pain and swelling.  You develop redness, numbness, or unexpected weakness.  Your symptoms  are getting worse rather than improving after several days. These symptoms may indicate that further evaluation or further X-rays are needed. Sometimes, X-rays may not show a small broken bone (fracture) until 1 week or 10 days later. Make a follow-up appointment with your caregiver. Ask when your X-ray results will be ready. Make sure you get your X-ray results. Document Released: 12/17/2000 Document Revised: 09/09/2013 Document Reviewed: 02/03/2011 [facility] Patient Information 2015 [place], [place]. This information is not intended to replace advice given to you by your health care provider. Make sure you discuss any questions you have with your health care provider.

## 2015-06-16 NOTE — Progress Notes (Signed)
HPI: Shannon Vincent is a 80 y.o. female who presents to [place]  today for chief complaint of:  Chief Complaint  Patient presents with  . Foot Pain    left foot pain and swelling after surgery    . Location: L ankle/heel, hurts at base of heel, hurts on anterior/lateral aspect (lateral malleolus area) . Quality: sore, swollen, pain with walking but able to bear weight . Severity: mild to moderate . Duration: 7 -8 days, had surgery on the ankle 12/2014  . Context: home PT working with her on gait/balance, no specific injury she can recall but thinks she may have overworked the joint.  . Modifying factors: tried ice without relief.  . Assoc signs/symptoms: no calf swelling/tenderness, no falls   Past medical, social and family history reviewed: Past Medical History  Diagnosis Date  . Parkinson disease     [facility]  . Osteoarthritis   . Arthritis     asteotosis  . Dermatitis     asteotosis  . Cancer    Past Surgical History  Procedure Laterality Date  . Spine surgery  2004    cervical spine fusion   . Brain surgery  09-10, 04-11    deep brain stimulator  . Ankle fracture surgery  12/2014   Social History  Substance Use Topics  . Smoking status: Never Smoker   . Smokeless tobacco: Not on file  . Alcohol Use: No   No family history on file.  Current Outpatient Prescriptions  Medication Sig Dispense Refill  . acetaminophen (TYLENOL) 325 MG tablet Take 2 tablets (650 mg total) by mouth every 6 (six) hours as needed for mild pain, moderate pain, fever or headache. 90 tablet 1  . amantadine (SYMMETREL) 100 MG capsule Take 1 capsule (100 mg total) by mouth 3 (three) times daily. 180 capsule 3  . AMBULATORY NON FORMULARY MEDICATION Shower Chair - Diagnosis - Parkinson's disease ICD-10 G20 1 each 0  . aspirin EC 81 MG tablet Take 1 tablet (81 mg total) by mouth daily. 90 tablet 4  . Calcium-Vitamin D 600-200 MG-UNIT per tablet Take 1 tablet by  mouth daily. 30 tablet 2  . carbidopa-levodopa (SINEMET IR) 25-100 MG per tablet Take 1.5 tablets by mouth 6 (six) times daily. 250 tablet 1  . cetirizine (ZYRTEC) 5 MG tablet Take 1 tablet (5 mg total) by mouth daily as needed for allergies or rhinitis. 30 tablet 1  . Cholecalciferol (VITAMIN D-1000 MAX ST) 1000 UNITS tablet Take 2,000 Units by mouth.    . folic acid (FOLVITE) 1 MG tablet Take 1 tablet (1 mg total) by mouth daily. 30 tablet 1  . levothyroxine (SYNTHROID, LEVOTHROID) 125 MCG tablet Take 1 tablet (125 mcg total) by mouth daily before breakfast. 30 tablet 2  . lisinopril (PRINIVIL,ZESTRIL) 20 MG tablet Take 1 tablet (20 mg total) by mouth daily. 90 tablet 3  . methotrexate (RHEUMATREX) 15 MG tablet Take 1 tablet (15 mg total) by mouth once a week. 4 tablet 1  . mirtazapine (REMERON) 15 MG tablet Take 1 tablet (15 mg total) by mouth at bedtime. 30 tablet 1  . pantoprazole (PROTONIX) 40 MG tablet Take 1 tablet (40 mg total) by mouth daily. 30 tablet 1  . potassium chloride (KLOR-CON) 20 MEQ packet Take 20 mEq by mouth daily. 30 packet 1  . pramipexole (MIRAPEX) 0.5 MG tablet Take 1 tablet (0.5 mg total) by mouth 5 (five) times daily. 150 tablet 2  .  senna-docusate (SENOKOT-S) 8.6-50 MG per tablet Take 2 tablets by mouth at bedtime as needed for mild constipation. 30 tablet 1  . Vitamin D, Ergocalciferol, (DRISDOL) 50000 UNITS CAPS capsule Take 1 capsule (50,000 Units total) by mouth every 7 (seven) days. 8 capsule 1   No current facility-administered medications for this visit.   Allergies  Allergen Reactions  . Codeine Nausea And Vomiting  . Morphine Diarrhea and Nausea And Vomiting  . Latex Rash      Review of Systems: CONSTITUTIONAL: Neg fever/chills, no unintentional weight changes CARDIAC: No chest pain/pressure/palpitations, no orthopnea RESPIRATORY: No cough/shortness of breath/wheeze MUSCULOSKELETAL: (+) myalgia/arthralgia - L ankle pain as per HPI SKIN: No  rash/wounds/concerning lesions HEM/ONC: No easy bruising/bleeding, no abnormal lymph node   Exam:  BP 130/69 mmHg  Pulse 65  Wt 158 lb (71.668 kg) Constitutional: VSS, see above. General Appearance: alert, well-developed, well-nourished, NAD Cardiovascular: Pedal pulse II/IV bilaterally DP and PT. No lower extremity edema. Musculoskeletal: Gait steady with use of rolling walker. No clubbing/cyanosis of digits. Ankle stable with eversion/inversion, pain plantar anterior calcaneus with plantar flexion, tenderness in this area as well, no tenderness to medial/lateral malleoli. Neg ant drawer test  Neurological: LE Motor and sensation intact and symmetric    No results found for this or any previous visit (from the past 72 hour(s)).    ASSESSMENT/PLAN:  Ankle swelling, left - Plan: DG Ankle Complete Left, VAS [place] LOWER EXTREMITY VENOUS (DVT)  Ankle pain, left - Plan: DG Ankle Complete Left  Plantar fasciitis, left  S/P surgical manipulation of ankle joint - s/p ORIF left tibia fx 01/15/15 - Plan: DG Ankle Complete Left   Advised they need to alert her surgeon to her symptoms since this ankle was operated on approx 5 - 6 months ago. I advised she use her ankle boot to stabilize and rest the joint, PT to continue as tolerated but avoid working ankle too much. Will get XR to confirm joint integrity, will get LE [place] to eval for DVT which is unlikely based on Wells criteria however family is concerned. Family sent to [facility] due to lack of vascular US tech here today. Advised them they should get these images done today.

## 2015-06-16 NOTE — Addendum Note (Signed)
Addended by: Jamesetta So on: 06/16/2015 04:24 PM   Modules accepted: Orders

## 2015-06-17 DIAGNOSIS — M069 Rheumatoid arthritis, unspecified: Secondary | ICD-10-CM | POA: Diagnosis not present

## 2015-06-17 DIAGNOSIS — G20 Parkinson's disease: Secondary | ICD-10-CM | POA: Diagnosis not present

## 2015-06-17 DIAGNOSIS — Z8781 Personal history of (healed) traumatic fracture: Secondary | ICD-10-CM | POA: Diagnosis not present

## 2015-06-17 DIAGNOSIS — R4189 Other symptoms and signs involving cognitive functions and awareness: Secondary | ICD-10-CM | POA: Diagnosis not present

## 2015-06-17 DIAGNOSIS — R2689 Other abnormalities of gait and mobility: Secondary | ICD-10-CM | POA: Diagnosis not present

## 2015-06-17 DIAGNOSIS — F329 Major depressive disorder, single episode, unspecified: Secondary | ICD-10-CM | POA: Diagnosis not present

## 2015-06-17 DIAGNOSIS — I10 Essential (primary) hypertension: Secondary | ICD-10-CM | POA: Diagnosis not present

## 2015-06-18 ENCOUNTER — Telehealth: Payer: Self-pay | Admitting: Osteopathic Medicine

## 2015-06-18 DIAGNOSIS — Z8781 Personal history of (healed) traumatic fracture: Secondary | ICD-10-CM | POA: Diagnosis not present

## 2015-06-18 DIAGNOSIS — G20 Parkinson's disease: Secondary | ICD-10-CM | POA: Diagnosis not present

## 2015-06-18 DIAGNOSIS — M069 Rheumatoid arthritis, unspecified: Secondary | ICD-10-CM | POA: Diagnosis not present

## 2015-06-18 DIAGNOSIS — R2689 Other abnormalities of gait and mobility: Secondary | ICD-10-CM | POA: Diagnosis not present

## 2015-06-18 DIAGNOSIS — F329 Major depressive disorder, single episode, unspecified: Secondary | ICD-10-CM | POA: Diagnosis not present

## 2015-06-18 DIAGNOSIS — I10 Essential (primary) hypertension: Secondary | ICD-10-CM | POA: Diagnosis not present

## 2015-06-18 NOTE — Telephone Encounter (Signed)
Spoke to daughter, she asked about picking up images of xray prior to going to ortho, left copy of report and printed images at front desk, she was also instructed can go downstairs to imaging to request a CD

## 2015-06-18 NOTE — Telephone Encounter (Signed)
Pts daughter- Mateo Flow called and her mom seen Dr. Sheppard Coil last week and she had a U/S and Xray at [place] [facility] and daughter would like to talk to [facility]

## 2015-06-19 DIAGNOSIS — G20 Parkinson's disease: Secondary | ICD-10-CM | POA: Diagnosis not present

## 2015-06-19 DIAGNOSIS — F329 Major depressive disorder, single episode, unspecified: Secondary | ICD-10-CM | POA: Diagnosis not present

## 2015-06-19 DIAGNOSIS — R2689 Other abnormalities of gait and mobility: Secondary | ICD-10-CM | POA: Diagnosis not present

## 2015-06-19 DIAGNOSIS — I10 Essential (primary) hypertension: Secondary | ICD-10-CM | POA: Diagnosis not present

## 2015-06-19 DIAGNOSIS — Z8781 Personal history of (healed) traumatic fracture: Secondary | ICD-10-CM | POA: Diagnosis not present

## 2015-06-19 DIAGNOSIS — M069 Rheumatoid arthritis, unspecified: Secondary | ICD-10-CM | POA: Diagnosis not present

## 2015-06-21 DIAGNOSIS — F329 Major depressive disorder, single episode, unspecified: Secondary | ICD-10-CM | POA: Diagnosis not present

## 2015-06-21 DIAGNOSIS — I10 Essential (primary) hypertension: Secondary | ICD-10-CM | POA: Diagnosis not present

## 2015-06-21 DIAGNOSIS — G20 Parkinson's disease: Secondary | ICD-10-CM | POA: Diagnosis not present

## 2015-06-21 DIAGNOSIS — R2689 Other abnormalities of gait and mobility: Secondary | ICD-10-CM | POA: Diagnosis not present

## 2015-06-21 DIAGNOSIS — M069 Rheumatoid arthritis, unspecified: Secondary | ICD-10-CM | POA: Diagnosis not present

## 2015-06-21 DIAGNOSIS — Z8781 Personal history of (healed) traumatic fracture: Secondary | ICD-10-CM | POA: Diagnosis not present

## 2015-06-22 DIAGNOSIS — Z96652 Presence of left artificial knee joint: Secondary | ICD-10-CM | POA: Diagnosis not present

## 2015-06-22 DIAGNOSIS — S82202G Unspecified fracture of shaft of left tibia, subsequent encounter for closed fracture with delayed healing: Secondary | ICD-10-CM | POA: Diagnosis not present

## 2015-06-22 DIAGNOSIS — S82402G Unspecified fracture of shaft of left fibula, subsequent encounter for closed fracture with delayed healing: Secondary | ICD-10-CM | POA: Diagnosis not present

## 2015-06-22 DIAGNOSIS — E785 Hyperlipidemia, unspecified: Secondary | ICD-10-CM | POA: Diagnosis not present

## 2015-06-22 DIAGNOSIS — G20 Parkinson's disease: Secondary | ICD-10-CM | POA: Diagnosis not present

## 2015-06-23 DIAGNOSIS — Z8781 Personal history of (healed) traumatic fracture: Secondary | ICD-10-CM | POA: Diagnosis not present

## 2015-06-23 DIAGNOSIS — M069 Rheumatoid arthritis, unspecified: Secondary | ICD-10-CM | POA: Diagnosis not present

## 2015-06-23 DIAGNOSIS — F329 Major depressive disorder, single episode, unspecified: Secondary | ICD-10-CM | POA: Diagnosis not present

## 2015-06-23 DIAGNOSIS — R2689 Other abnormalities of gait and mobility: Secondary | ICD-10-CM | POA: Diagnosis not present

## 2015-06-23 DIAGNOSIS — G20 Parkinson's disease: Secondary | ICD-10-CM | POA: Diagnosis not present

## 2015-06-23 DIAGNOSIS — I10 Essential (primary) hypertension: Secondary | ICD-10-CM | POA: Diagnosis not present

## 2015-06-25 DIAGNOSIS — R2689 Other abnormalities of gait and mobility: Secondary | ICD-10-CM | POA: Diagnosis not present

## 2015-06-25 DIAGNOSIS — I10 Essential (primary) hypertension: Secondary | ICD-10-CM | POA: Diagnosis not present

## 2015-06-25 DIAGNOSIS — Z8781 Personal history of (healed) traumatic fracture: Secondary | ICD-10-CM | POA: Diagnosis not present

## 2015-06-25 DIAGNOSIS — G20 Parkinson's disease: Secondary | ICD-10-CM | POA: Diagnosis not present

## 2015-06-25 DIAGNOSIS — F329 Major depressive disorder, single episode, unspecified: Secondary | ICD-10-CM | POA: Diagnosis not present

## 2015-06-25 DIAGNOSIS — M069 Rheumatoid arthritis, unspecified: Secondary | ICD-10-CM | POA: Diagnosis not present

## 2015-06-29 DIAGNOSIS — R2689 Other abnormalities of gait and mobility: Secondary | ICD-10-CM | POA: Diagnosis not present

## 2015-06-29 DIAGNOSIS — G20 Parkinson's disease: Secondary | ICD-10-CM | POA: Diagnosis not present

## 2015-06-29 DIAGNOSIS — Z8781 Personal history of (healed) traumatic fracture: Secondary | ICD-10-CM | POA: Diagnosis not present

## 2015-06-29 DIAGNOSIS — M069 Rheumatoid arthritis, unspecified: Secondary | ICD-10-CM | POA: Diagnosis not present

## 2015-06-29 DIAGNOSIS — F329 Major depressive disorder, single episode, unspecified: Secondary | ICD-10-CM | POA: Diagnosis not present

## 2015-06-29 DIAGNOSIS — I10 Essential (primary) hypertension: Secondary | ICD-10-CM | POA: Diagnosis not present

## 2015-07-01 DIAGNOSIS — Z8781 Personal history of (healed) traumatic fracture: Secondary | ICD-10-CM | POA: Diagnosis not present

## 2015-07-01 DIAGNOSIS — M069 Rheumatoid arthritis, unspecified: Secondary | ICD-10-CM | POA: Diagnosis not present

## 2015-07-01 DIAGNOSIS — I10 Essential (primary) hypertension: Secondary | ICD-10-CM | POA: Diagnosis not present

## 2015-07-01 DIAGNOSIS — F329 Major depressive disorder, single episode, unspecified: Secondary | ICD-10-CM | POA: Diagnosis not present

## 2015-07-01 DIAGNOSIS — G20 Parkinson's disease: Secondary | ICD-10-CM | POA: Diagnosis not present

## 2015-07-01 DIAGNOSIS — R2689 Other abnormalities of gait and mobility: Secondary | ICD-10-CM | POA: Diagnosis not present

## 2015-07-05 DIAGNOSIS — Z8781 Personal history of (healed) traumatic fracture: Secondary | ICD-10-CM | POA: Diagnosis not present

## 2015-07-05 DIAGNOSIS — I10 Essential (primary) hypertension: Secondary | ICD-10-CM | POA: Diagnosis not present

## 2015-07-05 DIAGNOSIS — R2689 Other abnormalities of gait and mobility: Secondary | ICD-10-CM | POA: Diagnosis not present

## 2015-07-05 DIAGNOSIS — F329 Major depressive disorder, single episode, unspecified: Secondary | ICD-10-CM | POA: Diagnosis not present

## 2015-07-05 DIAGNOSIS — M069 Rheumatoid arthritis, unspecified: Secondary | ICD-10-CM | POA: Diagnosis not present

## 2015-07-05 DIAGNOSIS — G20 Parkinson's disease: Secondary | ICD-10-CM | POA: Diagnosis not present

## 2015-07-06 DIAGNOSIS — R2689 Other abnormalities of gait and mobility: Secondary | ICD-10-CM | POA: Diagnosis not present

## 2015-07-06 DIAGNOSIS — I10 Essential (primary) hypertension: Secondary | ICD-10-CM | POA: Diagnosis not present

## 2015-07-06 DIAGNOSIS — F329 Major depressive disorder, single episode, unspecified: Secondary | ICD-10-CM | POA: Diagnosis not present

## 2015-07-06 DIAGNOSIS — M069 Rheumatoid arthritis, unspecified: Secondary | ICD-10-CM | POA: Diagnosis not present

## 2015-07-06 DIAGNOSIS — Z8781 Personal history of (healed) traumatic fracture: Secondary | ICD-10-CM | POA: Diagnosis not present

## 2015-07-06 DIAGNOSIS — G20 Parkinson's disease: Secondary | ICD-10-CM | POA: Diagnosis not present

## 2015-07-07 ENCOUNTER — Telehealth: Payer: Self-pay | Admitting: Osteopathic Medicine

## 2015-07-07 DIAGNOSIS — K5900 Constipation, unspecified: Secondary | ICD-10-CM

## 2015-07-07 DIAGNOSIS — M069 Rheumatoid arthritis, unspecified: Secondary | ICD-10-CM

## 2015-07-07 MED ORDER — FOLIC ACID 1 MG PO TABS: 1.0000 mg | ORAL_TABLET | Freq: Every day | ORAL | Status: AC

## 2015-07-07 MED ORDER — METHOTREXATE SODIUM 15 MG PO TABS: 15.0000 mg | ORAL_TABLET | ORAL | Status: AC

## 2015-07-07 MED ORDER — SENNOSIDES-DOCUSATE SODIUM 8.6-50 MG PO TABS: 2.0000 | ORAL_TABLET | Freq: Every evening | ORAL | Status: DC | PRN

## 2015-07-07 NOTE — Telephone Encounter (Signed)
Pt daughter Mateo Flow called and stated that the patient needs a refill on folic acid, methotrexate and senokot.. Thanks

## 2015-07-07 NOTE — Addendum Note (Signed)
Addended by: Maryla Morrow on: 07/07/2015 04:03 PM   Modules accepted: Orders

## 2015-07-07 NOTE — Telephone Encounter (Deleted)
Pt daughter Genene Churn called and stated her mother needs refills on folic acid

## 2015-07-08 ENCOUNTER — Other Ambulatory Visit: Payer: Self-pay | Admitting: Osteopathic Medicine

## 2015-07-08 DIAGNOSIS — I10 Essential (primary) hypertension: Secondary | ICD-10-CM | POA: Diagnosis not present

## 2015-07-08 DIAGNOSIS — Z8781 Personal history of (healed) traumatic fracture: Secondary | ICD-10-CM | POA: Diagnosis not present

## 2015-07-08 DIAGNOSIS — M069 Rheumatoid arthritis, unspecified: Secondary | ICD-10-CM | POA: Diagnosis not present

## 2015-07-08 DIAGNOSIS — G20 Parkinson's disease: Secondary | ICD-10-CM | POA: Diagnosis not present

## 2015-07-08 DIAGNOSIS — R2689 Other abnormalities of gait and mobility: Secondary | ICD-10-CM | POA: Diagnosis not present

## 2015-07-08 DIAGNOSIS — F329 Major depressive disorder, single episode, unspecified: Secondary | ICD-10-CM | POA: Diagnosis not present

## 2015-07-29 ENCOUNTER — Encounter: Payer: Self-pay | Admitting: Osteopathic Medicine

## 2015-07-29 ENCOUNTER — Ambulatory Visit (INDEPENDENT_AMBULATORY_CARE_PROVIDER_SITE_OTHER): Payer: Medicare Other | Admitting: Osteopathic Medicine

## 2015-07-29 VITALS — BP 125/72 | HR 95 | Wt 149.0 lb

## 2015-07-29 DIAGNOSIS — J302 Other seasonal allergic rhinitis: Secondary | ICD-10-CM

## 2015-07-29 DIAGNOSIS — E039 Hypothyroidism, unspecified: Secondary | ICD-10-CM | POA: Diagnosis not present

## 2015-07-29 DIAGNOSIS — Z780 Asymptomatic menopausal state: Secondary | ICD-10-CM

## 2015-07-29 DIAGNOSIS — D649 Anemia, unspecified: Secondary | ICD-10-CM | POA: Insufficient documentation

## 2015-07-29 DIAGNOSIS — E559 Vitamin D deficiency, unspecified: Secondary | ICD-10-CM

## 2015-07-29 DIAGNOSIS — Z9889 Other specified postprocedural states: Secondary | ICD-10-CM | POA: Insufficient documentation

## 2015-07-29 MED ORDER — CETIRIZINE HCL 5 MG PO TABS: 5.0000 mg | ORAL_TABLET | Freq: Every day | ORAL | Status: AC | PRN

## 2015-07-29 NOTE — Progress Notes (Signed)
HPI: Shannon Vincent is a 80 y.o. female who presents to [place]  today for chief complaint of:  Chief Complaint  Patient presents with  . Follow-up   [facility] - Saw ortho for ankle pain, was told no surgery needed, she is doing well, mild swelling persists but pain is resolved  VITAMIN D - taking weekly, due for repeat  ANEMIA - no weakness/dizziness, no blood in stool, due for repeat labs  THYROID - no problems with new med dose, due for f/u lab  Past medical, social and family history reviewed: Past Medical History  Diagnosis Date  . Parkinson disease     [facility] neuro  . Osteoarthritis   . Arthritis     asteotosis  . Dermatitis     asteotosis  . Cancer    Past Surgical History  Procedure Laterality Date  . Spine surgery  2004    cervical spine fusion   . Brain surgery  09-10, 04-11    deep brain stimulator  . Ankle fracture surgery  12/2014   Social History  Substance Use Topics  . Smoking status: Never Smoker   . Smokeless tobacco: Not on file  . Alcohol Use: No   No family history on file.  Current Outpatient Prescriptions  Medication Sig Dispense Refill  . acetaminophen (TYLENOL) 325 MG tablet Take 2 tablets (650 mg total) by mouth every 6 (six) hours as needed for mild pain, moderate pain, fever or headache. 90 tablet 1  . amantadine (SYMMETREL) 100 MG capsule Take 1 capsule (100 mg total) by mouth 3 (three) times daily. 180 capsule 3  . AMBULATORY NON FORMULARY MEDICATION Shower Chair - Diagnosis - Parkinson's disease ICD-10 G20 1 each 0  . Calcium-Vitamin D 600-200 MG-UNIT per tablet Take 1 tablet by mouth daily. 30 tablet 2  . carbidopa-levodopa (SINEMET IR) 25-100 MG tablet TAKE 1 &1/2 TABLETS BY MOUTH SIX TIMES DAILY 250 tablet 0  . cetirizine (ZYRTEC) 5 MG tablet Take 1 tablet (5 mg total) by mouth daily as needed for allergies or rhinitis. 30 tablet 1  . Cholecalciferol (VITAMIN D-1000 MAX ST) 1000 UNITS tablet Take 2,000 Units  by mouth.    . folic acid (FOLVITE) 1 MG tablet Take 1 tablet (1 mg total) by mouth daily. 30 tablet 6  . levothyroxine (SYNTHROID, LEVOTHROID) 125 MCG tablet Take 1 tablet (125 mcg total) by mouth daily before breakfast. 30 tablet 2  . lisinopril (PRINIVIL,ZESTRIL) 20 MG tablet Take 1 tablet (20 mg total) by mouth daily. 90 tablet 3  . methotrexate (RHEUMATREX) 15 MG tablet Take 1 tablet (15 mg total) by mouth once a week. 4 tablet 6  . mirtazapine (REMERON) 15 MG tablet Take 1 tablet (15 mg total) by mouth at bedtime. 30 tablet 1  . pantoprazole (PROTONIX) 40 MG tablet Take 1 tablet (40 mg total) by mouth daily. 30 tablet 1  . potassium chloride (KLOR-CON) 20 MEQ packet Take 20 mEq by mouth daily. 30 packet 1  . pramipexole (MIRAPEX) 0.5 MG tablet Take 1 tablet (0.5 mg total) by mouth 5 (five) times daily. 150 tablet 2  . senna-docusate (SENOKOT-S) 8.6-50 MG tablet Take 2 tablets by mouth at bedtime as needed for mild constipation. 30 tablet 0  . Vitamin D, Ergocalciferol, (DRISDOL) 50000 UNITS CAPS capsule Take 1 capsule (50,000 Units total) by mouth every 7 (seven) days. 8 capsule 1   No current facility-administered medications for this visit.   Allergies  Allergen Reactions  .  Codeine Nausea And Vomiting  . Morphine Diarrhea and Nausea And Vomiting  . Latex Rash      Review of Systems: CONSTITUTIONAL:  No  fever, no chills, No  unintentional weight changes CARDIAC: No chest pain RESPIRATORY: No  cough, No  shortness of breath/wheeze GASTROINTESTINAL: No nausea, no vomiting, no abdominal pain MUSCULOSKELETAL: No  myalgia/arthralgia NEUROLOGIC: No weakness, no dizziness, no slurred speech, parkinsons stable PSYCHIATRIC: No concerns with depression, no concerns with anxiety, no sleep problems    Exam:  BP 125/72 mmHg  Pulse 95  Wt 149 lb (67.586 kg) Constitutional: VSS, see above. General Appearance: alert, well-developed, well-nourished, NAD Respiratory: Normal respiratory  effort. no wheeze, no rhonchi, no rales Cardiovascular: S1/S2 normal, no murmur, no rub/gallop auscultated. RRR.  Musculoskeletal: Gait normal. No clubbing/cyanosis of digits. L ankle stable, mlid swelling but no pitting  Neurological: No cranial nerve deficit on limited exam. Motor and sensation intact and symmetric, tremor mild Psychiatric: Normal judgment/insight. Normal mood and affect. Oriented x3.    No results found for this or any previous visit (from the past 72 hour(s)).    ASSESSMENT/PLAN:  Vitamin D deficiency - Plan: Vitamin D (25 hydroxy)  Anemia, unspecified anemia type - Plan: CBC with Differential/Platelet  Hypothyroidism, unspecified hypothyroidism type - Plan: TSH  Seasonal allergies - Plan: cetirizine (ZYRTEC) 5 MG tablet  Postmenopausal  S/P surgical manipulation of ankle joint - following with ortho, dong well   Doing well, followup as below, all questions answered.   Return in about 4 months (around 11/26/2015), or or sooner if needed, for [employer].

## 2015-07-30 ENCOUNTER — Ambulatory Visit: Payer: Medicare Other | Admitting: Osteopathic Medicine

## 2015-07-30 LAB — CBC WITH DIFFERENTIAL/PLATELET
BASOS ABS: 0.1 10*3/uL (ref 0.0–0.1)
Basophils Relative: 1 % (ref 0–1)
EOS PCT: 2 % (ref 0–5)
Eosinophils Absolute: 0.1 10*3/uL (ref 0.0–0.7)
HCT: 34.7 % — ABNORMAL LOW (ref 36.0–46.0)
Hemoglobin: 10.9 g/dL — ABNORMAL LOW (ref 12.0–15.0)
LYMPHS ABS: 1.1 10*3/uL (ref 0.7–4.0)
LYMPHS PCT: 17 % (ref 12–46)
MCH: 31 pg (ref 26.0–34.0)
MCHC: 31.4 g/dL (ref 30.0–36.0)
MCV: 98.6 fL (ref 78.0–100.0)
MPV: 9.5 fL (ref 8.6–12.4)
Monocytes Absolute: 0.8 10*3/uL (ref 0.1–1.0)
Monocytes Relative: 12 % (ref 3–12)
Neutro Abs: 4.3 10*3/uL (ref 1.7–7.7)
Neutrophils Relative %: 68 % (ref 43–77)
PLATELETS: 333 10*3/uL (ref 150–400)
RBC: 3.52 MIL/uL — AB (ref 3.87–5.11)
RDW: 14.4 % (ref 11.5–15.5)
WBC: 6.3 10*3/uL (ref 4.0–10.5)

## 2015-07-30 LAB — VITAMIN D 25 HYDROXY (VIT D DEFICIENCY, FRACTURES): VIT D 25 HYDROXY: 53 ng/mL (ref 30–100)

## 2015-07-30 LAB — TSH: TSH: 0.147 u[IU]/mL — ABNORMAL LOW (ref 0.350–4.500)

## 2015-07-30 MED ORDER — LEVOTHYROXINE SODIUM 112 MCG PO TABS: 112.0000 ug | ORAL_TABLET | Freq: Every day | ORAL | Status: DC

## 2015-07-30 MED ORDER — CALCIUM CARBONATE-VITAMIN D 600-400 MG-UNIT PO CHEW: 2.0000 | CHEWABLE_TABLET | Freq: Every day | ORAL | Status: DC

## 2015-07-30 NOTE — Addendum Note (Signed)
Addended by: Maryla Morrow on: 07/30/2015 12:13 PM   Modules accepted: Orders, Medications

## 2015-08-05 DIAGNOSIS — Z4789 Encounter for other orthopedic aftercare: Secondary | ICD-10-CM | POA: Diagnosis not present

## 2015-08-05 DIAGNOSIS — S82202G Unspecified fracture of shaft of left tibia, subsequent encounter for closed fracture with delayed healing: Secondary | ICD-10-CM | POA: Diagnosis not present

## 2015-08-05 DIAGNOSIS — S82402G Unspecified fracture of shaft of left fibula, subsequent encounter for closed fracture with delayed healing: Secondary | ICD-10-CM | POA: Diagnosis not present

## 2015-08-05 DIAGNOSIS — M86272 Subacute osteomyelitis, left ankle and foot: Secondary | ICD-10-CM | POA: Diagnosis not present

## 2015-08-10 ENCOUNTER — Other Ambulatory Visit: Payer: Self-pay | Admitting: Osteopathic Medicine

## 2015-08-23 ENCOUNTER — Other Ambulatory Visit: Payer: Self-pay | Admitting: Osteopathic Medicine

## 2015-09-03 DIAGNOSIS — G20 Parkinson's disease: Secondary | ICD-10-CM | POA: Diagnosis not present

## 2015-09-03 DIAGNOSIS — R1310 Dysphagia, unspecified: Secondary | ICD-10-CM | POA: Diagnosis not present

## 2015-09-09 ENCOUNTER — Other Ambulatory Visit: Payer: Self-pay | Admitting: Osteopathic Medicine

## 2015-09-10 ENCOUNTER — Ambulatory Visit: Payer: Medicare Other

## 2015-09-23 DIAGNOSIS — M7632 Iliotibial band syndrome, left leg: Secondary | ICD-10-CM | POA: Diagnosis not present

## 2015-09-23 DIAGNOSIS — Z471 Aftercare following joint replacement surgery: Secondary | ICD-10-CM | POA: Diagnosis not present

## 2015-09-23 DIAGNOSIS — Z96652 Presence of left artificial knee joint: Secondary | ICD-10-CM | POA: Diagnosis not present

## 2015-09-23 DIAGNOSIS — E785 Hyperlipidemia, unspecified: Secondary | ICD-10-CM | POA: Diagnosis not present

## 2015-09-27 ENCOUNTER — Other Ambulatory Visit: Payer: Self-pay | Admitting: Osteopathic Medicine

## 2015-09-28 ENCOUNTER — Other Ambulatory Visit: Payer: Self-pay | Admitting: Osteopathic Medicine

## 2015-09-29 DIAGNOSIS — R1310 Dysphagia, unspecified: Secondary | ICD-10-CM | POA: Diagnosis not present

## 2015-09-29 DIAGNOSIS — K225 Diverticulum of esophagus, acquired: Secondary | ICD-10-CM | POA: Diagnosis not present

## 2015-10-04 ENCOUNTER — Other Ambulatory Visit: Payer: Self-pay | Admitting: Osteopathic Medicine

## 2015-10-12 ENCOUNTER — Other Ambulatory Visit: Payer: Self-pay | Admitting: Osteopathic Medicine

## 2015-10-13 ENCOUNTER — Other Ambulatory Visit: Payer: Self-pay | Admitting: Osteopathic Medicine

## 2015-10-21 ENCOUNTER — Other Ambulatory Visit: Payer: Self-pay | Admitting: Osteopathic Medicine

## 2015-10-26 DIAGNOSIS — M810 Age-related osteoporosis without current pathological fracture: Secondary | ICD-10-CM | POA: Diagnosis not present

## 2015-10-26 DIAGNOSIS — M069 Rheumatoid arthritis, unspecified: Secondary | ICD-10-CM | POA: Diagnosis not present

## 2015-10-26 DIAGNOSIS — Z91040 Latex allergy status: Secondary | ICD-10-CM | POA: Diagnosis not present

## 2015-10-26 DIAGNOSIS — Z9689 Presence of other specified functional implants: Secondary | ICD-10-CM | POA: Diagnosis not present

## 2015-10-26 DIAGNOSIS — R350 Frequency of micturition: Secondary | ICD-10-CM | POA: Diagnosis not present

## 2015-10-26 DIAGNOSIS — Z885 Allergy status to narcotic agent status: Secondary | ICD-10-CM | POA: Diagnosis not present

## 2015-10-26 DIAGNOSIS — E785 Hyperlipidemia, unspecified: Secondary | ICD-10-CM | POA: Diagnosis not present

## 2015-10-26 DIAGNOSIS — Z886 Allergy status to analgesic agent status: Secondary | ICD-10-CM | POA: Diagnosis not present

## 2015-10-26 DIAGNOSIS — G20 Parkinson's disease: Secondary | ICD-10-CM | POA: Diagnosis not present

## 2015-10-26 DIAGNOSIS — Z79899 Other long term (current) drug therapy: Secondary | ICD-10-CM | POA: Diagnosis not present

## 2015-10-26 DIAGNOSIS — N390 Urinary tract infection, site not specified: Secondary | ICD-10-CM | POA: Diagnosis not present

## 2015-10-26 DIAGNOSIS — M1990 Unspecified osteoarthritis, unspecified site: Secondary | ICD-10-CM | POA: Diagnosis not present

## 2015-10-26 LAB — URINE CULTURE

## 2015-10-28 ENCOUNTER — Telehealth: Payer: Self-pay

## 2015-10-28 NOTE — Telephone Encounter (Signed)
Patient states she was seen in the ED in [place] and treated with Cephalexin 500 mg and Xanax. She states the u/a was sent to Dr Sheppard Coil. She is concerned about taking the Xanax because it is making her feel out of it. She is not sure what she should do. Take the Xanax or stop taking the Xanax.

## 2015-10-28 NOTE — Telephone Encounter (Signed)
Spoke to patient daughter gave her information as noted below. Advised her that patient can stop the Xanax but she stated that the patient wanted it to calm her down. Roshon Duell,CMA

## 2015-10-28 NOTE — Telephone Encounter (Signed)
Can stop Xanax.

## 2015-10-29 ENCOUNTER — Encounter: Payer: Self-pay | Admitting: Osteopathic Medicine

## 2015-10-29 DIAGNOSIS — N390 Urinary tract infection, site not specified: Secondary | ICD-10-CM | POA: Insufficient documentation

## 2015-10-30 DIAGNOSIS — Z7409 Other reduced mobility: Secondary | ICD-10-CM | POA: Diagnosis not present

## 2015-10-30 DIAGNOSIS — Z91040 Latex allergy status: Secondary | ICD-10-CM | POA: Diagnosis not present

## 2015-10-30 DIAGNOSIS — E039 Hypothyroidism, unspecified: Secondary | ICD-10-CM | POA: Diagnosis not present

## 2015-10-30 DIAGNOSIS — E860 Dehydration: Secondary | ICD-10-CM | POA: Diagnosis not present

## 2015-10-30 DIAGNOSIS — G249 Dystonia, unspecified: Secondary | ICD-10-CM | POA: Diagnosis not present

## 2015-10-30 DIAGNOSIS — R05 Cough: Secondary | ICD-10-CM | POA: Diagnosis not present

## 2015-10-30 DIAGNOSIS — E538 Deficiency of other specified B group vitamins: Secondary | ICD-10-CM | POA: Diagnosis not present

## 2015-10-30 DIAGNOSIS — I10 Essential (primary) hypertension: Secondary | ICD-10-CM | POA: Diagnosis not present

## 2015-10-30 DIAGNOSIS — S0990XA Unspecified injury of head, initial encounter: Secondary | ICD-10-CM | POA: Diagnosis not present

## 2015-10-30 DIAGNOSIS — R2689 Other abnormalities of gait and mobility: Secondary | ICD-10-CM | POA: Diagnosis not present

## 2015-10-30 DIAGNOSIS — Z79899 Other long term (current) drug therapy: Secondary | ICD-10-CM | POA: Diagnosis not present

## 2015-10-30 DIAGNOSIS — N390 Urinary tract infection, site not specified: Secondary | ICD-10-CM | POA: Diagnosis not present

## 2015-10-30 DIAGNOSIS — G20 Parkinson's disease: Secondary | ICD-10-CM | POA: Diagnosis not present

## 2015-10-30 DIAGNOSIS — Z886 Allergy status to analgesic agent status: Secondary | ICD-10-CM | POA: Diagnosis not present

## 2015-10-31 DIAGNOSIS — G20 Parkinson's disease: Secondary | ICD-10-CM | POA: Diagnosis not present

## 2015-10-31 DIAGNOSIS — G249 Dystonia, unspecified: Secondary | ICD-10-CM | POA: Diagnosis not present

## 2015-10-31 DIAGNOSIS — I10 Essential (primary) hypertension: Secondary | ICD-10-CM | POA: Diagnosis not present

## 2015-11-01 DIAGNOSIS — I10 Essential (primary) hypertension: Secondary | ICD-10-CM | POA: Diagnosis not present

## 2015-11-01 DIAGNOSIS — G20 Parkinson's disease: Secondary | ICD-10-CM | POA: Diagnosis not present

## 2015-11-01 DIAGNOSIS — G249 Dystonia, unspecified: Secondary | ICD-10-CM | POA: Diagnosis not present

## 2015-11-02 ENCOUNTER — Telehealth: Payer: Self-pay | Admitting: Osteopathic Medicine

## 2015-11-02 DIAGNOSIS — I10 Essential (primary) hypertension: Secondary | ICD-10-CM | POA: Diagnosis not present

## 2015-11-02 DIAGNOSIS — E538 Deficiency of other specified B group vitamins: Secondary | ICD-10-CM | POA: Diagnosis not present

## 2015-11-02 DIAGNOSIS — G20 Parkinson's disease: Secondary | ICD-10-CM | POA: Diagnosis not present

## 2015-11-02 DIAGNOSIS — G249 Dystonia, unspecified: Secondary | ICD-10-CM | POA: Diagnosis not present

## 2015-11-02 DIAGNOSIS — E039 Hypothyroidism, unspecified: Secondary | ICD-10-CM | POA: Diagnosis not present

## 2015-11-02 NOTE — Telephone Encounter (Signed)
":)))()‚¬  ssney!)(9-fin)& Er.

## 2015-11-03 DIAGNOSIS — G20 Parkinson's disease: Secondary | ICD-10-CM | POA: Diagnosis not present

## 2015-11-03 DIAGNOSIS — R3915 Urgency of urination: Secondary | ICD-10-CM | POA: Diagnosis not present

## 2015-11-03 DIAGNOSIS — I10 Essential (primary) hypertension: Secondary | ICD-10-CM | POA: Diagnosis not present

## 2015-11-03 DIAGNOSIS — E039 Hypothyroidism, unspecified: Secondary | ICD-10-CM | POA: Diagnosis not present

## 2015-11-03 DIAGNOSIS — G249 Dystonia, unspecified: Secondary | ICD-10-CM | POA: Diagnosis not present

## 2015-11-03 DIAGNOSIS — E538 Deficiency of other specified B group vitamins: Secondary | ICD-10-CM | POA: Diagnosis not present

## 2015-11-03 DIAGNOSIS — N390 Urinary tract infection, site not specified: Secondary | ICD-10-CM | POA: Diagnosis not present

## 2015-11-03 NOTE — Telephone Encounter (Signed)
Appears to be typo/error, disregard.

## 2015-11-04 DIAGNOSIS — N390 Urinary tract infection, site not specified: Secondary | ICD-10-CM | POA: Diagnosis not present

## 2015-11-04 DIAGNOSIS — R3915 Urgency of urination: Secondary | ICD-10-CM | POA: Diagnosis not present

## 2015-11-04 DIAGNOSIS — I10 Essential (primary) hypertension: Secondary | ICD-10-CM | POA: Diagnosis not present

## 2015-11-04 DIAGNOSIS — G249 Dystonia, unspecified: Secondary | ICD-10-CM | POA: Diagnosis not present

## 2015-11-04 DIAGNOSIS — E538 Deficiency of other specified B group vitamins: Secondary | ICD-10-CM | POA: Diagnosis not present

## 2015-11-04 DIAGNOSIS — E039 Hypothyroidism, unspecified: Secondary | ICD-10-CM | POA: Diagnosis not present

## 2015-11-04 DIAGNOSIS — G20 Parkinson's disease: Secondary | ICD-10-CM | POA: Diagnosis not present

## 2015-11-05 ENCOUNTER — Encounter: Payer: Self-pay | Admitting: Osteopathic Medicine

## 2015-11-09 ENCOUNTER — Other Ambulatory Visit: Payer: Self-pay | Admitting: Osteopathic Medicine

## 2015-11-09 ENCOUNTER — Telehealth: Payer: Self-pay | Admitting: Osteopathic Medicine

## 2015-11-09 NOTE — Telephone Encounter (Signed)
Nursing home/rehabilitation sent over paperwork for me to review, asking to sign off on patient's medication list and diet. I reviewed medications, several duplications/errors, I called over and spoke to the nurse taking care of Shannon Vincent, apparently she was admitted there after a fall, she is under the care of attending physician Dr. Reymundo Poll, confirmed I am not be attending of record for her stay at the facility, nurse told me that they just needed a list of medicines from the PCP. Apparently listed been sent earlier that was not given to me first for review, I reviewed patient's medications, cleaned up the list a little bit in terms of  deleting duplications. I authorized regular diet no restrictions and can add boost the patient will tolerate this. There is also a list of basic standing orders, I did write on there that I have nothing to add her no concerns about these orders however reiterated that I am not the patient's attending physician while she is in the facility, any emergent concerns need to be directed to Dr. Fredderick Phenix or whoever is covering for him.

## 2015-11-10 DIAGNOSIS — G20 Parkinson's disease: Secondary | ICD-10-CM | POA: Diagnosis not present

## 2015-11-10 DIAGNOSIS — R296 Repeated falls: Secondary | ICD-10-CM | POA: Diagnosis not present

## 2015-11-10 DIAGNOSIS — Z87440 Personal history of urinary (tract) infections: Secondary | ICD-10-CM | POA: Diagnosis not present

## 2015-11-10 DIAGNOSIS — M109 Gout, unspecified: Secondary | ICD-10-CM | POA: Diagnosis not present

## 2015-11-10 DIAGNOSIS — M1990 Unspecified osteoarthritis, unspecified site: Secondary | ICD-10-CM | POA: Diagnosis not present

## 2015-11-10 DIAGNOSIS — F329 Major depressive disorder, single episode, unspecified: Secondary | ICD-10-CM | POA: Diagnosis not present

## 2015-11-10 DIAGNOSIS — Z9181 History of falling: Secondary | ICD-10-CM | POA: Diagnosis not present

## 2015-11-10 DIAGNOSIS — M069 Rheumatoid arthritis, unspecified: Secondary | ICD-10-CM | POA: Diagnosis not present

## 2015-11-10 DIAGNOSIS — I10 Essential (primary) hypertension: Secondary | ICD-10-CM | POA: Diagnosis not present

## 2015-11-12 DIAGNOSIS — M1990 Unspecified osteoarthritis, unspecified site: Secondary | ICD-10-CM | POA: Diagnosis not present

## 2015-11-12 DIAGNOSIS — I10 Essential (primary) hypertension: Secondary | ICD-10-CM | POA: Diagnosis not present

## 2015-11-12 DIAGNOSIS — R296 Repeated falls: Secondary | ICD-10-CM | POA: Diagnosis not present

## 2015-11-12 DIAGNOSIS — M109 Gout, unspecified: Secondary | ICD-10-CM | POA: Diagnosis not present

## 2015-11-12 DIAGNOSIS — G20 Parkinson's disease: Secondary | ICD-10-CM | POA: Diagnosis not present

## 2015-11-12 DIAGNOSIS — M069 Rheumatoid arthritis, unspecified: Secondary | ICD-10-CM | POA: Diagnosis not present

## 2015-11-17 DIAGNOSIS — M109 Gout, unspecified: Secondary | ICD-10-CM | POA: Diagnosis not present

## 2015-11-17 DIAGNOSIS — M069 Rheumatoid arthritis, unspecified: Secondary | ICD-10-CM | POA: Diagnosis not present

## 2015-11-17 DIAGNOSIS — G20 Parkinson's disease: Secondary | ICD-10-CM | POA: Diagnosis not present

## 2015-11-17 DIAGNOSIS — R296 Repeated falls: Secondary | ICD-10-CM | POA: Diagnosis not present

## 2015-11-17 DIAGNOSIS — M1990 Unspecified osteoarthritis, unspecified site: Secondary | ICD-10-CM | POA: Diagnosis not present

## 2015-11-17 DIAGNOSIS — I10 Essential (primary) hypertension: Secondary | ICD-10-CM | POA: Diagnosis not present

## 2015-11-19 DIAGNOSIS — M069 Rheumatoid arthritis, unspecified: Secondary | ICD-10-CM | POA: Diagnosis not present

## 2015-11-19 DIAGNOSIS — M1990 Unspecified osteoarthritis, unspecified site: Secondary | ICD-10-CM | POA: Diagnosis not present

## 2015-11-19 DIAGNOSIS — M109 Gout, unspecified: Secondary | ICD-10-CM | POA: Diagnosis not present

## 2015-11-19 DIAGNOSIS — R296 Repeated falls: Secondary | ICD-10-CM | POA: Diagnosis not present

## 2015-11-19 DIAGNOSIS — G20 Parkinson's disease: Secondary | ICD-10-CM | POA: Diagnosis not present

## 2015-11-19 DIAGNOSIS — I10 Essential (primary) hypertension: Secondary | ICD-10-CM | POA: Diagnosis not present

## 2015-11-23 DIAGNOSIS — R296 Repeated falls: Secondary | ICD-10-CM | POA: Diagnosis not present

## 2015-11-23 DIAGNOSIS — M109 Gout, unspecified: Secondary | ICD-10-CM | POA: Diagnosis not present

## 2015-11-23 DIAGNOSIS — M1990 Unspecified osteoarthritis, unspecified site: Secondary | ICD-10-CM | POA: Diagnosis not present

## 2015-11-23 DIAGNOSIS — I10 Essential (primary) hypertension: Secondary | ICD-10-CM | POA: Diagnosis not present

## 2015-11-23 DIAGNOSIS — M069 Rheumatoid arthritis, unspecified: Secondary | ICD-10-CM | POA: Diagnosis not present

## 2015-11-23 DIAGNOSIS — G20 Parkinson's disease: Secondary | ICD-10-CM | POA: Diagnosis not present

## 2015-11-26 ENCOUNTER — Ambulatory Visit (INDEPENDENT_AMBULATORY_CARE_PROVIDER_SITE_OTHER): Payer: Medicare Other | Admitting: Osteopathic Medicine

## 2015-11-26 ENCOUNTER — Encounter: Payer: Self-pay | Admitting: Osteopathic Medicine

## 2015-11-26 VITALS — BP 151/92 | HR 93 | Wt 154.0 lb

## 2015-11-26 DIAGNOSIS — E039 Hypothyroidism, unspecified: Secondary | ICD-10-CM | POA: Diagnosis not present

## 2015-11-26 DIAGNOSIS — M109 Gout, unspecified: Secondary | ICD-10-CM | POA: Diagnosis not present

## 2015-11-26 DIAGNOSIS — N390 Urinary tract infection, site not specified: Secondary | ICD-10-CM

## 2015-11-26 DIAGNOSIS — M069 Rheumatoid arthritis, unspecified: Secondary | ICD-10-CM | POA: Diagnosis not present

## 2015-11-26 DIAGNOSIS — I10 Essential (primary) hypertension: Secondary | ICD-10-CM

## 2015-11-26 DIAGNOSIS — E559 Vitamin D deficiency, unspecified: Secondary | ICD-10-CM

## 2015-11-26 DIAGNOSIS — R296 Repeated falls: Secondary | ICD-10-CM | POA: Diagnosis not present

## 2015-11-26 DIAGNOSIS — Z9181 History of falling: Secondary | ICD-10-CM

## 2015-11-26 DIAGNOSIS — G20 Parkinson's disease: Secondary | ICD-10-CM | POA: Diagnosis not present

## 2015-11-26 DIAGNOSIS — Z23 Encounter for immunization: Secondary | ICD-10-CM | POA: Diagnosis not present

## 2015-11-26 DIAGNOSIS — M1990 Unspecified osteoarthritis, unspecified site: Secondary | ICD-10-CM | POA: Diagnosis not present

## 2015-11-26 MED ORDER — LEVOTHYROXINE SODIUM 88 MCG PO TABS: 88.0000 ug | ORAL_TABLET | Freq: Every day | ORAL | Status: DC

## 2015-11-26 MED ORDER — MIRTAZAPINE 15 MG PO TABS: 15.0000 mg | ORAL_TABLET | Freq: Every day | ORAL | Status: DC

## 2015-11-26 NOTE — Patient Instructions (Signed)
PLEASE BRING ALL PILL BOTTLES FROM HOME TO YOU NEXT VISIT PLEASE FOLLOW UP WITH DR. HAQ ABOUT YOUR SINEMET DOSE, YOUR BATTERY QUESTIONS, AND YOUR WEAKNESS  PLAN TO COME BACK TO DR Sheppard Coil IN 6 -8 WEEKS - WE WILL NEED TO RECHECK YOUR THYROID   ANY OTHER QUESTIONS OR PROBLEMS, PLEASE LET us KNOW RIGHT AWAY! TAKE CARE!

## 2015-11-26 NOTE — Progress Notes (Signed)
HPI: Shannon Vincent is a 81 y.o. female who presents to [place] today for chief complaint of:  Chief Complaint  Patient presents with  . Hospitalization Follow-up    FALL    Was in hospital for weakness. Records reviewed.   D/C to rehab, now getting rehab at home.   Neurologist - Took her remote away for her nerual stimulator since there was concern for her turning it off when messing with the batteries. Patient is concern for batteries in her internal device being low, these are recently interrogated, insurance won't replace if they're less than 3.0 but these were above those values.  Sinemet - Dr Tonye Royalty (Neuro) had prescribed this for 6 times per day while awake, hospital had changed this to 4 times per day, currently taking it 1 1/2 pills 5 times per day. She has not followed up with him after being outside of the hospital   Still feeling slow/weak.   UTI - taking Keflex 3 times per day. Started by nursing home.   THYROID - 11/01/15 TSH low and T4 high, noted in DC summary to recheck 4 - 6 weeks, on Synthroid 112, as far as I can tell she was admitted on this dose and no changes were made, though this is listed again in D/C summary as "changed meds" at 112. Patient states she is still on the 112 mg dose  Past medical, social and family history reviewed: Past Medical History  Diagnosis Date  . Parkinson disease ([place])     [facility] neuro  . Osteoarthritis   . Arthritis     asteotosis  . Dermatitis     asteotosis  . Cancer [facility])    Past Surgical History  Procedure Laterality Date  . Spine surgery  2004    cervical spine fusion   . Brain surgery  09-10, 04-11    deep brain stimulator  . Ankle fracture surgery  12/2014   Social History  Substance Use Topics  . Smoking status: Never Smoker   . Smokeless tobacco: Not on file  . Alcohol Use: No   No family history on file.  Current Outpatient Prescriptions  Medication Sig Dispense Refill  .  acetaminophen (TYLENOL) 325 MG tablet Take 2 tablets (650 mg total) by mouth every 6 (six) hours as needed for mild pain, moderate pain, fever or headache. 90 tablet 1  . amantadine (SYMMETREL) 100 MG capsule Take 1 capsule (100 mg total) by mouth 3 (three) times daily. 180 capsule 3  . AMBULATORY NON FORMULARY MEDICATION Shower Chair - Diagnosis - Parkinson's disease ICD-10 G20 1 each 0  . Calcium Carbonate-Vitamin D 600-400 MG-UNIT chew tablet Chew 2 tablets by mouth daily. 60 tablet 12  . carbidopa-levodopa (SINEMET IR) 25-100 MG tablet TAKE 1 & 1/2 TABLETS BY MOUTH SIX TIMES DAILY 250 tablet 0  . cetirizine (ZYRTEC) 5 MG tablet Take 1 tablet (5 mg total) by mouth daily as needed for allergies or rhinitis. 30 tablet 12  . levothyroxine (SYNTHROID, LEVOTHROID) 112 MCG tablet TAKE ONE TABLET BY MOUTH EVERY DAY BEFORE BREAKFAST 30 tablet 0  . lisinopril (PRINIVIL,ZESTRIL) 20 MG tablet Take 1 tablet (20 mg total) by mouth daily. 90 tablet 3  . methotrexate (RHEUMATREX) 15 MG tablet Take 1 tablet (15 mg total) by mouth once a week. 4 tablet 6  . mirtazapine (REMERON) 15 MG tablet TAKE ONE TABLET BY MOUTH AT BEDTIME 30 tablet 0  . pantoprazole (PROTONIX) 40 MG tablet TAKE ONE  TABLET BY MOUTH EVERY DAY 30 tablet 0  . potassium chloride (KLOR-CON) 20 MEQ packet Take 20 mEq by mouth daily. 30 packet 1  . pramipexole (MIRAPEX) 0.5 MG tablet Take 1 tablet (0.5 mg total) by mouth 5 (five) times daily. APPOINTMENT NEEDED FOR FUTURE REFILLS 90 tablet 0  . SENEXON-S 8.6-50 MG tablet TAKE 2 TABLETS BY MOUTH AT BEDTIME AS NEEDED FOR MILD CONSTIPATION 30 tablet 0   No current facility-administered medications for this visit.   Allergies  Allergen Reactions  . Codeine Nausea And Vomiting  . Morphine Diarrhea and Nausea And Vomiting  . Latex Rash      Review of Systems: CONSTITUTIONAL:  No  fever, no chills, No  unintentional weight changes, (+) fatigue HEAD/EYES/EARS/NOSE/THROAT: No  headache, no vision  change CARDIAC: No  chest pain RESPIRATORY: No  cough, No  shortness of breath/wheeze (+) seasonal allergies are a bit worse lately GASTROINTESTINAL: No  nausea, No  vomiting, No  abdominal pain GENITOURINARY: No  abnormal genital bleeding/discharge, no hematuria/dysuria, no urinary frequency SKIN: No  rash/wounds/concerning lesions ENDOCRINE: No polyuria/polydipsia/polyphagia, No  heat/cold intolerance,   NEUROLOGIC: (+) Generalized weakness, No  dizziness, No  slurred speech, no falls   Exam:  BP 151/92 mmHg  Pulse 93  Wt 154 lb (69.854 kg) Constitutional: VS see above. General Appearance: alert, well-developed, well-nourished, NAD Eyes: Normal lids and conjunctive, non-icteric sclera, PERRLA Ears, Nose, Mouth, Throat: MMM, Normal external inspection ears/nares/mouth/lips/gums Neck: No masses, trachea midline. No thyroid enlargement/tenderness/mass appreciated. No lymphadenopathy Respiratory: Normal respiratory effort. no wheeze, no rhonchi, no rales Cardiovascular: S1/S2 normal, no murmur, no rub/gallop auscultated. RRR.No lower extremity edema. Musculoskeletal: Gait with assistance of cane, steady. No clubbing/cyanosis of digits.  Neurological:  Motor and sensation intact and symmetric Skin: warm, dry, intact. No rash/ulcer. No concerning nevi or subq nodules on limited exam.   Psychiatric: Normal judgment/insight. Normal mood and affect.    No results found for this or any previous visit (from the past 72 hour(s)).    ASSESSMENT/PLAN:    Parkinson disease ([place]) - 5 needs to follow-up with her neurologist, need clarification on this Sinemet dose, hospital discharge her on 4 times per day, she is taking it 5, neurologist previously recommended taking at 6 times per day  Vitamin D deficiency - plan to recheck this when I recheck thyroid levels  Hypothyroidism, unspecified hypothyroidism type - Plan: levothyroxine (SYNTHROID, LEVOTHROID) 88 MCG tablet - switch from 112 g dose to  this, recheck 6-8 weeks  Urinary tract infection, site not specified - Plan: POCT urinalysis dipstick - canceled, patient unable to give a sample, lack of urinary symptoms at this time, she was just worried since she has recently undergone treatment for UTI. Patient advised if she has any more concerns for this, concerning, into the office for evaluation  Essential hypertension - in concert with mild elevation at her age  At risk for falling - continue home rehabilitation  Need for prophylactic vaccination and inoculation against influenza - Plan: Flu Vaccine QUAD 36+ mos IM  Also reviewed patient is on methotrexate 15 mg tablets 6 per week, this dose exceeds the 0.4 mg/kg per week which would be safe to receive live attenuated vaccines with Zostavax. Explained to patient that if she is on high-dose methotrexate she should not receive the zoster vaccine  NEED TO BRING PILL BOTTLES TO NEXT VISIT TO CLARIFY MEDICATION LIST.   Return in about 8 weeks (around 01/21/2016), or sooner if symptoms worsen or fail  to improve, for recheck thyroid and review medicines .

## 2015-11-30 DIAGNOSIS — M069 Rheumatoid arthritis, unspecified: Secondary | ICD-10-CM | POA: Diagnosis not present

## 2015-11-30 DIAGNOSIS — G20 Parkinson's disease: Secondary | ICD-10-CM | POA: Diagnosis not present

## 2015-11-30 DIAGNOSIS — M1990 Unspecified osteoarthritis, unspecified site: Secondary | ICD-10-CM | POA: Diagnosis not present

## 2015-11-30 DIAGNOSIS — R296 Repeated falls: Secondary | ICD-10-CM | POA: Diagnosis not present

## 2015-11-30 DIAGNOSIS — I10 Essential (primary) hypertension: Secondary | ICD-10-CM | POA: Diagnosis not present

## 2015-11-30 DIAGNOSIS — M109 Gout, unspecified: Secondary | ICD-10-CM | POA: Diagnosis not present

## 2015-12-02 DIAGNOSIS — G20 Parkinson's disease: Secondary | ICD-10-CM | POA: Diagnosis not present

## 2015-12-02 DIAGNOSIS — M069 Rheumatoid arthritis, unspecified: Secondary | ICD-10-CM | POA: Diagnosis not present

## 2015-12-02 DIAGNOSIS — I10 Essential (primary) hypertension: Secondary | ICD-10-CM | POA: Diagnosis not present

## 2015-12-02 DIAGNOSIS — M109 Gout, unspecified: Secondary | ICD-10-CM | POA: Diagnosis not present

## 2015-12-02 DIAGNOSIS — R296 Repeated falls: Secondary | ICD-10-CM | POA: Diagnosis not present

## 2015-12-02 DIAGNOSIS — M1990 Unspecified osteoarthritis, unspecified site: Secondary | ICD-10-CM | POA: Diagnosis not present

## 2015-12-08 DIAGNOSIS — G20 Parkinson's disease: Secondary | ICD-10-CM | POA: Diagnosis not present

## 2015-12-08 DIAGNOSIS — M069 Rheumatoid arthritis, unspecified: Secondary | ICD-10-CM | POA: Diagnosis not present

## 2015-12-08 DIAGNOSIS — I10 Essential (primary) hypertension: Secondary | ICD-10-CM | POA: Diagnosis not present

## 2015-12-08 DIAGNOSIS — M1990 Unspecified osteoarthritis, unspecified site: Secondary | ICD-10-CM | POA: Diagnosis not present

## 2015-12-08 DIAGNOSIS — R296 Repeated falls: Secondary | ICD-10-CM | POA: Diagnosis not present

## 2015-12-08 DIAGNOSIS — M109 Gout, unspecified: Secondary | ICD-10-CM | POA: Diagnosis not present

## 2015-12-10 DIAGNOSIS — M109 Gout, unspecified: Secondary | ICD-10-CM | POA: Diagnosis not present

## 2015-12-10 DIAGNOSIS — M069 Rheumatoid arthritis, unspecified: Secondary | ICD-10-CM | POA: Diagnosis not present

## 2015-12-10 DIAGNOSIS — R296 Repeated falls: Secondary | ICD-10-CM | POA: Diagnosis not present

## 2015-12-10 DIAGNOSIS — G20 Parkinson's disease: Secondary | ICD-10-CM | POA: Diagnosis not present

## 2015-12-10 DIAGNOSIS — M1990 Unspecified osteoarthritis, unspecified site: Secondary | ICD-10-CM | POA: Diagnosis not present

## 2015-12-10 DIAGNOSIS — I10 Essential (primary) hypertension: Secondary | ICD-10-CM | POA: Diagnosis not present

## 2015-12-13 DIAGNOSIS — M109 Gout, unspecified: Secondary | ICD-10-CM | POA: Diagnosis not present

## 2015-12-13 DIAGNOSIS — M1990 Unspecified osteoarthritis, unspecified site: Secondary | ICD-10-CM | POA: Diagnosis not present

## 2015-12-13 DIAGNOSIS — G20 Parkinson's disease: Secondary | ICD-10-CM | POA: Diagnosis not present

## 2015-12-13 DIAGNOSIS — R296 Repeated falls: Secondary | ICD-10-CM | POA: Diagnosis not present

## 2015-12-13 DIAGNOSIS — M069 Rheumatoid arthritis, unspecified: Secondary | ICD-10-CM | POA: Diagnosis not present

## 2015-12-13 DIAGNOSIS — I10 Essential (primary) hypertension: Secondary | ICD-10-CM | POA: Diagnosis not present

## 2015-12-15 DIAGNOSIS — M109 Gout, unspecified: Secondary | ICD-10-CM | POA: Diagnosis not present

## 2015-12-15 DIAGNOSIS — M1990 Unspecified osteoarthritis, unspecified site: Secondary | ICD-10-CM | POA: Diagnosis not present

## 2015-12-15 DIAGNOSIS — G20 Parkinson's disease: Secondary | ICD-10-CM | POA: Diagnosis not present

## 2015-12-15 DIAGNOSIS — R296 Repeated falls: Secondary | ICD-10-CM | POA: Diagnosis not present

## 2015-12-15 DIAGNOSIS — M069 Rheumatoid arthritis, unspecified: Secondary | ICD-10-CM | POA: Diagnosis not present

## 2015-12-15 DIAGNOSIS — I10 Essential (primary) hypertension: Secondary | ICD-10-CM | POA: Diagnosis not present

## 2015-12-29 DIAGNOSIS — Z1231 Encounter for screening mammogram for malignant neoplasm of breast: Secondary | ICD-10-CM | POA: Diagnosis not present

## 2016-01-03 ENCOUNTER — Encounter: Payer: Self-pay | Admitting: Osteopathic Medicine

## 2016-01-03 DIAGNOSIS — Z1239 Encounter for other screening for malignant neoplasm of breast: Secondary | ICD-10-CM | POA: Insufficient documentation

## 2016-01-11 ENCOUNTER — Other Ambulatory Visit: Payer: Self-pay | Admitting: Osteopathic Medicine

## 2016-01-18 ENCOUNTER — Other Ambulatory Visit: Payer: Self-pay | Admitting: Osteopathic Medicine

## 2016-01-24 ENCOUNTER — Ambulatory Visit (INDEPENDENT_AMBULATORY_CARE_PROVIDER_SITE_OTHER): Payer: Medicare Other | Admitting: Family Medicine

## 2016-01-24 ENCOUNTER — Encounter: Payer: Self-pay | Admitting: Family Medicine

## 2016-01-24 VITALS — BP 161/94 | HR 96 | Wt 162.0 lb

## 2016-01-24 DIAGNOSIS — B86 Scabies: Secondary | ICD-10-CM | POA: Diagnosis not present

## 2016-01-24 MED ORDER — PERMETHRIN 5 % EX CREA: 1.0000 "application " | TOPICAL_CREAM | Freq: Once | CUTANEOUS | Status: DC

## 2016-01-24 NOTE — Progress Notes (Signed)
Hilari Hable is a 81 y.o. female who presents to [facility] today for rash and itching. Patient has a several day history of severe itching associated with a rash on her trunk. She notes her dog also has been very itchy recently and has an appointment with the veterinarian today. She denies any new medicines cosmetics detergent shampoos etc. No fevers chills nausea vomiting or diarrhea. She is just tried some lotion which has not helped much. The rash is very itchy at night.   Past Medical History  Diagnosis Date  . Parkinson disease ([place])     [facility] neuro  . Osteoarthritis   . Arthritis     asteotosis  . Dermatitis     asteotosis  . Cancer [facility])    Past Surgical History  Procedure Laterality Date  . Spine surgery  2004    cervical spine fusion   . Brain surgery  09-10, 04-11    deep brain stimulator  . Ankle fracture surgery  12/2014   Social History  Substance Use Topics  . Smoking status: Never Smoker   . Smokeless tobacco: Not on file  . Alcohol Use: No   family history is not on file.  ROS as above Medications: Current Outpatient Prescriptions  Medication Sig Dispense Refill  . acetaminophen (TYLENOL) 325 MG tablet Take 2 tablets (650 mg total) by mouth every 6 (six) hours as needed for mild pain, moderate pain, fever or headache. 90 tablet 1  . ALPRAZolam (XANAX) 0.5 MG tablet TAKE ONE TABLET BY MOUTH AT BEDTIME AS NEEDED FOR SLEEP OR ANXIETY 5 tablet 0  . amantadine (SYMMETREL) 100 MG capsule Take 1 capsule (100 mg total) by mouth 3 (three) times daily. 180 capsule 3  . AMBULATORY NON FORMULARY MEDICATION Shower Chair - Diagnosis - Parkinson's disease ICD-10 G20 1 each 0  . Calcium Carbonate-Vitamin D 600-400 MG-UNIT chew tablet Chew 2 tablets by mouth daily. 60 tablet 12  . carbidopa-levodopa (SINEMET IR) 25-100 MG tablet TAKE 1 & 1/2 TABLETS BY MOUTH SIX TIMES DAILY  250 tablet 0  . cetirizine (ZYRTEC) 5 MG tablet Take 1 tablet (5 mg total) by mouth daily as needed for allergies or rhinitis. 30 tablet 12  . levothyroxine (SYNTHROID, LEVOTHROID) 88 MCG tablet TAKE ONE TABLET BY MOUTH DAILY BEFORE BREAKFAST 30 tablet 0  . lisinopril (PRINIVIL,ZESTRIL) 20 MG tablet Take 1 tablet (20 mg total) by mouth daily. 90 tablet 3  . mirtazapine (REMERON) 15 MG tablet Take 1 tablet (15 mg total) by mouth at bedtime. 30 tablet 0  . pantoprazole (PROTONIX) 40 MG tablet TAKE ONE TABLET BY MOUTH EVERY DAY 30 tablet 0  . potassium chloride (KLOR-CON) 20 MEQ packet Take 20 mEq by mouth daily. 30 packet 1  . pramipexole (MIRAPEX) 0.5 MG tablet Take 1 tablet (0.5 mg total) by mouth 5 (five) times daily. APPOINTMENT NEEDED FOR FUTURE REFILLS 90 tablet 0  . SENEXON-S 8.6-50 MG tablet TAKE 2 TABLETS BY MOUTH AT BEDTIME AS NEEDED FOR MILD CONSTIPATION 60 tablet 0  . permethrin (ELIMITE) 5 % cream Apply 1 application topically once. Apply to entire body from neck to feet and leave on overnight, avoid face. 60 g 3   No current facility-administered medications for this visit.   Allergies  Allergen Reactions  . Codeine Nausea And Vomiting  . Morphine Diarrhea and Nausea And Vomiting  . Latex Rash     Exam:  BP 161/94 mmHg  Pulse 96  Wt 162 lb (73.483 kg) Gen: Well NAD HEENT: EOMI,  MMM Lungs: Normal work of breathing. CTABL Heart: RRR no MRG Abd: NABS, Soft. Nondistended, Nontender Exts: Brisk capillary refill, warm and well perfused.  Skin: Erythematous excoriated papules on trunk present bilaterally. Blanchable. Nontender.  No results found for this or any previous visit (from the past 24 hour(s)). No results found.   81 year old woman with probable scabies. Treat empirically with permethrin. Recommend dog get checked for sarcoptic mange. Recommend also use topical Gold Bond Itch. Avoid anticholinergics and steroids due to age and Parkinson's disease. Follow-up if not  improving.

## 2016-01-24 NOTE — Patient Instructions (Signed)
Thank you for coming in today. Apply the permethrin cream from the neck down at night and wash off in the morning. Use Benadryl at night as needed for itching. Use Gold Bond Itch as needed. Come back if not getting better or worsening.  Scabies, Adult Scabies is a skin condition that happens when very small insects get under the skin (infestation). This causes a rash and severe itchiness. Scabies can spread from person to person (is contagious). If you get scabies, it is common for others in your household to get scabies too. With proper treatment, symptoms usually go away in 2-4 weeks. Scabies usually does not cause lasting problems. CAUSES This condition is caused by mites (Sarcoptes scabiei, or human itch mites) that can only be seen with a microscope. The mites get into the top layer of skin and lay eggs. Scabies can spread from person to person through:  Close contact with a person who has scabies.  Contact with infested items, such as towels, bedding, or clothing. RISK FACTORS This condition is more likely to develop in:  People who live in nursing homes and other extended-care facilities.  People who have sexual contact with a partner who has scabies.  Young children who attend child care facilities.  People who care for others who are at increased risk for scabies. SYMPTOMS Symptoms of this condition may include:  Severe itchiness. This is often worse at night.  A rash that includes tiny red bumps or blisters. The rash commonly occurs on the wrist, elbow, armpit, fingers, waist, groin, or buttocks. Bumps may form a line (burrow) in some areas.  Skin irritation. This can include scaly patches or sores. DIAGNOSIS This condition is diagnosed with a physical exam. Your health care provider will look closely at your skin. In some cases, your health care provider may take a sample of your affected skin (skin scraping) and have it examined under a microscope. TREATMENT This  condition may be treated with:  Medicated cream or lotion that kills the mites. This is spread on the entire body and left on for several hours. Usually, one treatment with medicated cream or lotion is enough to kill all of the mites. In severe cases, the treatment may be repeated.  Medicated cream that relieves itching.  Medicines that help to relieve itching.  Medicines that kill the mites. This treatment is rarely used. HOME CARE INSTRUCTIONS Medicines  Take or apply over-the-counter and prescription medicines as told by your health care provider.  Apply medicated cream or lotion as told by your health care provider.  Do not wash off the medicated cream or lotion until the necessary amount of time has passed. Skin Care  Avoid scratching your affected skin.  Keep your fingernails closely trimmed to reduce injury from scratching.  Take cool baths or apply cool washcloths to help reduce itching. General Instructions  Clean all items that you recently had contact with, including bedding, clothing, and furniture. Do this on the same day that your treatment starts.  Use hot water when you wash items.  Place unwashable items into closed, airtight plastic bags for at least 3 days. The mites cannot live for more than 3 days away from human skin.  Vacuum furniture and mattresses that you use.  Make sure that other people who may have been infested are examined by a health care provider. These include members of your household and anyone who may have had contact with infested items.  Keep all follow-up visits as told by  your health care provider. This is important. SEEK MEDICAL CARE IF:  You have itching that does not go away after 4 weeks of treatment.  You continue to develop new bumps or burrows.  You have redness, swelling, or pain in your rash area after treatment.  You have fluid, blood, or pus coming from your rash.   This information is not intended to replace advice  given to you by your health care provider. Make sure you discuss any questions you have with your health care provider.   Document Released: 05/26/2015 Document Reviewed: 04/06/2015 Elsevier Interactive Patient Education [employer].

## 2016-01-27 ENCOUNTER — Other Ambulatory Visit: Payer: Self-pay | Admitting: *Deleted

## 2016-01-27 NOTE — Telephone Encounter (Signed)
Agreed -

## 2016-01-27 NOTE — Telephone Encounter (Signed)
The pharmacy called and states the patient has been using the permetherin cream every night instead of just applying it once. The pharmacy is asking [place] what do we want them to tell the patient. Advised to tell the patient after speaking with Dr. Dianah Field to stop using the cream.I called the patient and after speaking with the patient it was clarified that she was using the cream every night but she was just only using it on one area of her body, her neck, so she probably will need another prescription sent into the pharm to apply on the other areas just once.The patient voiced undertanding that she is supposed to apply once overnight and then wash off in am. Sending in a refill of this medication.Changing the directions to say to apply to entire body below the neck overnight and wash off in the am. If there is anything different you want me to tell her or if you don't agree please advise.

## 2016-01-31 ENCOUNTER — Other Ambulatory Visit: Payer: Self-pay | Admitting: Osteopathic Medicine

## 2016-02-01 ENCOUNTER — Ambulatory Visit (INDEPENDENT_AMBULATORY_CARE_PROVIDER_SITE_OTHER): Payer: Medicare Other | Admitting: Osteopathic Medicine

## 2016-02-01 ENCOUNTER — Encounter: Payer: Self-pay | Admitting: Osteopathic Medicine

## 2016-02-01 VITALS — BP 141/86 | HR 109 | Ht 61.0 in | Wt 163.0 lb

## 2016-02-01 DIAGNOSIS — G20 Parkinson's disease: Secondary | ICD-10-CM

## 2016-02-01 DIAGNOSIS — E039 Hypothyroidism, unspecified: Secondary | ICD-10-CM

## 2016-02-01 DIAGNOSIS — G4700 Insomnia, unspecified: Secondary | ICD-10-CM

## 2016-02-01 DIAGNOSIS — B86 Scabies: Secondary | ICD-10-CM | POA: Diagnosis not present

## 2016-02-01 DIAGNOSIS — I10 Essential (primary) hypertension: Secondary | ICD-10-CM | POA: Diagnosis not present

## 2016-02-01 DIAGNOSIS — R21 Rash and other nonspecific skin eruption: Secondary | ICD-10-CM

## 2016-02-01 MED ORDER — METHYLPREDNISOLONE 4 MG PO TBPK: ORAL_TABLET | ORAL | Status: DC

## 2016-02-01 MED ORDER — TRAZODONE HCL 50 MG PO TABS: 25.0000 mg | ORAL_TABLET | Freq: Every evening | ORAL | Status: DC | PRN

## 2016-02-01 MED ORDER — ALPRAZOLAM 0.25 MG PO TABS: 0.2500 mg | ORAL_TABLET | Freq: Every evening | ORAL | Status: DC | PRN

## 2016-02-01 MED ORDER — CALAMINE EX LOTN: 1.0000 "application " | TOPICAL_LOTION | CUTANEOUS | Status: DC | PRN

## 2016-02-01 NOTE — Progress Notes (Signed)
HPI: Shannon Vincent is a 81 y.o. female who presents to [facility] today for chief complaint of:  Chief Complaint  Patient presents with  . Follow-up    anxiety/sleep (xanax refill), thyroid, rash   INSOMNIA - Requests refill of Xanax, she uses this more nights than not, doesn't think she has tried any other alternatives for sleep, has been on this for many years.  RASH - recently seen here in clinic and treated for scabies, one course of permethrin when the patient was taking it properly, administered over the entire body, she says it helped a little bit maybe but overall she is still quite itchy. Concern for her dog having fleas, dog does sleep with her at night, a veterinarian recently saw the dog and was concerned for fleas as well.  OTHER SKIN - patient has concern for a few spots on her face, she says in the past she used to be seen by dermatology, has had multiple benign lesions removed, would like to get established with a dermatologist again  THYROID - due for recheck of TSH  PARKINSONS - [facility], has appointment in the next few weeks, thinks her stimulator may be off, she is quite tremorous today  Past medical, social and family history reviewed: Past Medical History  Diagnosis Date  . Parkinson disease ([place])     [facility] neuro  . Osteoarthritis   . Arthritis     asteotosis  . Dermatitis     asteotosis  . Cancer [facility])    Past Surgical History  Procedure Laterality Date  . Spine surgery  2004    cervical spine fusion   . Brain surgery  09-10, 04-11    deep brain stimulator  . Ankle fracture surgery  12/2014   Social History  Substance Use Topics  . Smoking status: Never Smoker   . Smokeless tobacco: Not on file  . Alcohol Use: No   No family history on file.  Current Outpatient Prescriptions  Medication Sig Dispense Refill  . acetaminophen (TYLENOL) 325 MG tablet Take 2 tablets (650 mg total) by mouth every 6 (six) hours as  needed for mild pain, moderate pain, fever or headache. 90 tablet 1  . ALPRAZolam (XANAX) 0.5 MG tablet TAKE ONE TABLET BY MOUTH AT BEDTIME AS NEEDED FOR SLEEP OR ANXIETY 5 tablet 0  . amantadine (SYMMETREL) 100 MG capsule Take 1 capsule (100 mg total) by mouth 3 (three) times daily. 180 capsule 3  . AMBULATORY NON FORMULARY MEDICATION Shower Chair - Diagnosis - Parkinson's disease ICD-10 G20 1 each 0  . Calcium Carbonate-Vitamin D 600-400 MG-UNIT chew tablet Chew 2 tablets by mouth daily. 60 tablet 12  . carbidopa-levodopa (SINEMET IR) 25-100 MG tablet TAKE 1 & 1/2 TABLETS BY MOUTH SIX TIMES DAILY 250 tablet 0  . cetirizine (ZYRTEC) 5 MG tablet Take 1 tablet (5 mg total) by mouth daily as needed for allergies or rhinitis. 30 tablet 12  . levothyroxine (SYNTHROID, LEVOTHROID) 88 MCG tablet TAKE ONE TABLET BY MOUTH DAILY BEFORE BREAKFAST 30 tablet 0  . lisinopril (PRINIVIL,ZESTRIL) 20 MG tablet Take 1 tablet (20 mg total) by mouth daily. 90 tablet 3  . mirtazapine (REMERON) 15 MG tablet Take 1 tablet (15 mg total) by mouth at bedtime. 30 tablet 0  . pantoprazole (PROTONIX) 40 MG tablet TAKE ONE TABLET BY MOUTH EVERY DAY 30 tablet 0  . permethrin (ELIMITE) 5 % cream Apply 1 application topically once. Apply to entire body  from neck to feet and leave on overnight, avoid face. 60 g 3  . potassium chloride (KLOR-CON) 20 MEQ packet Take 20 mEq by mouth daily. 30 packet 1  . pramipexole (MIRAPEX) 0.5 MG tablet Take 1 tablet (0.5 mg total) by mouth 5 (five) times daily. APPOINTMENT NEEDED FOR FUTURE REFILLS 90 tablet 0  . SENEXON-S 8.6-50 MG tablet TAKE 2 TABLETS BY MOUTH AT BEDTIME AS NEEDED FOR MILD CONSTIPATION 60 tablet 0   No current facility-administered medications for this visit.   Allergies  Allergen Reactions  . Codeine Nausea And Vomiting  . Morphine Diarrhea and Nausea And Vomiting  . Latex Rash      Review of Systems: CONSTITUTIONAL:  No  fever HEAD/EYES/EARS/NOSE/THROAT: No   headache, no vision change CARDIAC: No  chest pain, MUSCULOSKELETAL: No  myalgia/arthralgia SKIN: (+) rash/wounds/concerning lesions HEM/ONC: No  easy bruising/bleeding, No  abnormal lymph node ENDOCRINE: No polyuria/polydipsia/polyphagia, No  heat/cold intolerance  NEUROLOGIC: No  weakness, No  dizziness, No  slurred speech PSYCHIATRIC: No  concerns with depression, No  concerns with anxiety, No sleep problems  Exam:  BP 141/86 mmHg  Pulse 109  Ht 5\' 1"  (1.549 m)  Wt 163 lb (73.936 kg)  BMI 30.81 kg/m2 Constitutional: VS see above. General Appearance: alert, well-developed, well-nourished, NAD Respiratory: Normal respiratory effort. no wheeze, no rhonchi, no rales Cardiovascular: S1/S2 normal, no murmur, no rub/gallop auscultated. RRR. No lower extremity edema. Skin: Erythematous excoriated maculopapular lesions on trunk, worse under breasts, nontender No concerning nevi or subq nodules on limited exam.      ASSESSMENT/PLAN:   Parkinson disease ([place])  Hypothyroidism, unspecified hypothyroidism type - Plan: Thyroid Panel With TSH  Essential hypertension  Scabies - Permethrin seem to help rash somewhat, patient is still quite itchy around the trunk  Rash and nonspecific skin eruption - Not convinced it scabies, could repeat permethrin in 1 week, we'll treat as urticaria, dermatology referral pending - Plan: methylPREDNISolone (MEDROL DOSEPAK) 4 MG TBPK tablet, calamine lotion, Ambulatory referral to Dermatology  Insomnia - Advised will decrease dose of Xanax, try alternative such as trazodone or over-the-counter melatonin. Patient is open to this plan. - Plan: ALPRAZolam (XANAX) 0.25 MG tablet, traZODone (DESYREL) 50 MG tablet   All questions were answered. Visit summary with updated medication list and pertinent instructions was printed for patient. ER/RTC precautions were reviewed with the patient. Return in about 3 months (around 05/03/2016) for F/U THYROID, HTN,  PARKINSON'S.

## 2016-02-01 NOTE — Patient Instructions (Addendum)
Try Permethrin cream again in one week if rash not better. We are sending referral to dermatology for this and for other skin concerns - please let us know if you haven't heard about an appointment in the next few days! See below for other medications to help itching. If rash becomes more severe, please let us know.

## 2016-02-02 LAB — THYROID PANEL WITH TSH
Free Thyroxine Index: 3.2 (ref 1.4–3.8)
T3 UPTAKE: 31 % (ref 22–35)
T4 TOTAL: 10.4 ug/dL (ref 4.5–12.0)
TSH: 2.37 mIU/L

## 2016-02-06 DIAGNOSIS — Z7409 Other reduced mobility: Secondary | ICD-10-CM | POA: Diagnosis not present

## 2016-02-06 DIAGNOSIS — R21 Rash and other nonspecific skin eruption: Secondary | ICD-10-CM | POA: Diagnosis present

## 2016-02-06 DIAGNOSIS — G20 Parkinson's disease: Secondary | ICD-10-CM | POA: Diagnosis present

## 2016-02-06 DIAGNOSIS — E039 Hypothyroidism, unspecified: Secondary | ICD-10-CM | POA: Diagnosis not present

## 2016-02-06 DIAGNOSIS — Z96652 Presence of left artificial knee joint: Secondary | ICD-10-CM | POA: Diagnosis present

## 2016-02-06 DIAGNOSIS — R262 Difficulty in walking, not elsewhere classified: Secondary | ICD-10-CM | POA: Diagnosis not present

## 2016-02-06 DIAGNOSIS — Z7952 Long term (current) use of systemic steroids: Secondary | ICD-10-CM | POA: Diagnosis not present

## 2016-02-06 DIAGNOSIS — Z792 Long term (current) use of antibiotics: Secondary | ICD-10-CM | POA: Diagnosis not present

## 2016-02-06 DIAGNOSIS — I10 Essential (primary) hypertension: Secondary | ICD-10-CM | POA: Diagnosis not present

## 2016-02-06 DIAGNOSIS — F329 Major depressive disorder, single episode, unspecified: Secondary | ICD-10-CM | POA: Diagnosis not present

## 2016-02-06 DIAGNOSIS — M1990 Unspecified osteoarthritis, unspecified site: Secondary | ICD-10-CM | POA: Diagnosis present

## 2016-02-06 DIAGNOSIS — Z9689 Presence of other specified functional implants: Secondary | ICD-10-CM | POA: Diagnosis present

## 2016-02-06 DIAGNOSIS — K219 Gastro-esophageal reflux disease without esophagitis: Secondary | ICD-10-CM | POA: Diagnosis present

## 2016-02-06 DIAGNOSIS — Z79899 Other long term (current) drug therapy: Secondary | ICD-10-CM | POA: Diagnosis not present

## 2016-02-06 DIAGNOSIS — Z885 Allergy status to narcotic agent status: Secondary | ICD-10-CM | POA: Diagnosis not present

## 2016-02-06 DIAGNOSIS — Z91040 Latex allergy status: Secondary | ICD-10-CM | POA: Diagnosis not present

## 2016-02-06 DIAGNOSIS — Z7982 Long term (current) use of aspirin: Secondary | ICD-10-CM | POA: Diagnosis not present

## 2016-02-06 DIAGNOSIS — E785 Hyperlipidemia, unspecified: Secondary | ICD-10-CM | POA: Diagnosis present

## 2016-02-06 DIAGNOSIS — R531 Weakness: Secondary | ICD-10-CM | POA: Diagnosis not present

## 2016-02-06 DIAGNOSIS — Z981 Arthrodesis status: Secondary | ICD-10-CM | POA: Diagnosis not present

## 2016-02-06 DIAGNOSIS — I517 Cardiomegaly: Secondary | ICD-10-CM | POA: Diagnosis not present

## 2016-02-06 DIAGNOSIS — Z85828 Personal history of other malignant neoplasm of skin: Secondary | ICD-10-CM | POA: Diagnosis not present

## 2016-02-06 DIAGNOSIS — R05 Cough: Secondary | ICD-10-CM | POA: Diagnosis not present

## 2016-02-08 ENCOUNTER — Other Ambulatory Visit: Payer: Self-pay | Admitting: Osteopathic Medicine

## 2016-02-09 ENCOUNTER — Other Ambulatory Visit: Payer: Self-pay | Admitting: Osteopathic Medicine

## 2016-02-09 ENCOUNTER — Telehealth: Payer: Self-pay

## 2016-02-09 DIAGNOSIS — M1712 Unilateral primary osteoarthritis, left knee: Secondary | ICD-10-CM | POA: Diagnosis not present

## 2016-02-09 DIAGNOSIS — F329 Major depressive disorder, single episode, unspecified: Secondary | ICD-10-CM | POA: Diagnosis not present

## 2016-02-09 DIAGNOSIS — F458 Other somatoform disorders: Secondary | ICD-10-CM | POA: Diagnosis not present

## 2016-02-09 DIAGNOSIS — I10 Essential (primary) hypertension: Secondary | ICD-10-CM | POA: Diagnosis not present

## 2016-02-09 DIAGNOSIS — M069 Rheumatoid arthritis, unspecified: Secondary | ICD-10-CM | POA: Diagnosis not present

## 2016-02-09 DIAGNOSIS — G20 Parkinson's disease: Secondary | ICD-10-CM | POA: Diagnosis not present

## 2016-02-09 NOTE — Telephone Encounter (Signed)
Notified [employer].

## 2016-02-09 NOTE — Telephone Encounter (Signed)
Yes

## 2016-02-10 ENCOUNTER — Other Ambulatory Visit: Payer: Self-pay | Admitting: Osteopathic Medicine

## 2016-02-10 MED ORDER — ESCITALOPRAM OXALATE 5 MG PO TABS: 5.0000 mg | ORAL_TABLET | Freq: Every day | ORAL | Status: DC

## 2016-02-15 DIAGNOSIS — F329 Major depressive disorder, single episode, unspecified: Secondary | ICD-10-CM | POA: Diagnosis not present

## 2016-02-15 DIAGNOSIS — M069 Rheumatoid arthritis, unspecified: Secondary | ICD-10-CM | POA: Diagnosis not present

## 2016-02-15 DIAGNOSIS — M1712 Unilateral primary osteoarthritis, left knee: Secondary | ICD-10-CM | POA: Diagnosis not present

## 2016-02-15 DIAGNOSIS — I10 Essential (primary) hypertension: Secondary | ICD-10-CM | POA: Diagnosis not present

## 2016-02-15 DIAGNOSIS — F458 Other somatoform disorders: Secondary | ICD-10-CM | POA: Diagnosis not present

## 2016-02-15 DIAGNOSIS — G20 Parkinson's disease: Secondary | ICD-10-CM | POA: Diagnosis not present

## 2016-02-17 DIAGNOSIS — F329 Major depressive disorder, single episode, unspecified: Secondary | ICD-10-CM | POA: Diagnosis not present

## 2016-02-17 DIAGNOSIS — I10 Essential (primary) hypertension: Secondary | ICD-10-CM | POA: Diagnosis not present

## 2016-02-17 DIAGNOSIS — M069 Rheumatoid arthritis, unspecified: Secondary | ICD-10-CM | POA: Diagnosis not present

## 2016-02-17 DIAGNOSIS — G20 Parkinson's disease: Secondary | ICD-10-CM | POA: Diagnosis not present

## 2016-02-17 DIAGNOSIS — M1712 Unilateral primary osteoarthritis, left knee: Secondary | ICD-10-CM | POA: Diagnosis not present

## 2016-02-17 DIAGNOSIS — F458 Other somatoform disorders: Secondary | ICD-10-CM | POA: Diagnosis not present

## 2016-02-18 DIAGNOSIS — M1712 Unilateral primary osteoarthritis, left knee: Secondary | ICD-10-CM | POA: Diagnosis not present

## 2016-02-18 DIAGNOSIS — I10 Essential (primary) hypertension: Secondary | ICD-10-CM | POA: Diagnosis not present

## 2016-02-18 DIAGNOSIS — M069 Rheumatoid arthritis, unspecified: Secondary | ICD-10-CM | POA: Diagnosis not present

## 2016-02-18 DIAGNOSIS — F329 Major depressive disorder, single episode, unspecified: Secondary | ICD-10-CM | POA: Diagnosis not present

## 2016-02-18 DIAGNOSIS — G20 Parkinson's disease: Secondary | ICD-10-CM | POA: Diagnosis not present

## 2016-02-18 DIAGNOSIS — F458 Other somatoform disorders: Secondary | ICD-10-CM | POA: Diagnosis not present

## 2016-02-21 DIAGNOSIS — F329 Major depressive disorder, single episode, unspecified: Secondary | ICD-10-CM | POA: Diagnosis not present

## 2016-02-21 DIAGNOSIS — M1712 Unilateral primary osteoarthritis, left knee: Secondary | ICD-10-CM | POA: Diagnosis not present

## 2016-02-21 DIAGNOSIS — G20 Parkinson's disease: Secondary | ICD-10-CM | POA: Diagnosis not present

## 2016-02-21 DIAGNOSIS — M069 Rheumatoid arthritis, unspecified: Secondary | ICD-10-CM | POA: Diagnosis not present

## 2016-02-21 DIAGNOSIS — F458 Other somatoform disorders: Secondary | ICD-10-CM | POA: Diagnosis not present

## 2016-02-21 DIAGNOSIS — I10 Essential (primary) hypertension: Secondary | ICD-10-CM | POA: Diagnosis not present

## 2016-02-22 DIAGNOSIS — F458 Other somatoform disorders: Secondary | ICD-10-CM | POA: Diagnosis not present

## 2016-02-22 DIAGNOSIS — I10 Essential (primary) hypertension: Secondary | ICD-10-CM | POA: Diagnosis not present

## 2016-02-22 DIAGNOSIS — M069 Rheumatoid arthritis, unspecified: Secondary | ICD-10-CM | POA: Diagnosis not present

## 2016-02-22 DIAGNOSIS — F329 Major depressive disorder, single episode, unspecified: Secondary | ICD-10-CM | POA: Diagnosis not present

## 2016-02-22 DIAGNOSIS — G20 Parkinson's disease: Secondary | ICD-10-CM | POA: Diagnosis not present

## 2016-02-22 DIAGNOSIS — M1712 Unilateral primary osteoarthritis, left knee: Secondary | ICD-10-CM | POA: Diagnosis not present

## 2016-02-23 DIAGNOSIS — F458 Other somatoform disorders: Secondary | ICD-10-CM | POA: Diagnosis not present

## 2016-02-23 DIAGNOSIS — G20 Parkinson's disease: Secondary | ICD-10-CM | POA: Diagnosis not present

## 2016-02-23 DIAGNOSIS — I10 Essential (primary) hypertension: Secondary | ICD-10-CM | POA: Diagnosis not present

## 2016-02-23 DIAGNOSIS — M1712 Unilateral primary osteoarthritis, left knee: Secondary | ICD-10-CM | POA: Diagnosis not present

## 2016-02-23 DIAGNOSIS — F329 Major depressive disorder, single episode, unspecified: Secondary | ICD-10-CM | POA: Diagnosis not present

## 2016-02-23 DIAGNOSIS — M069 Rheumatoid arthritis, unspecified: Secondary | ICD-10-CM | POA: Diagnosis not present

## 2016-03-01 ENCOUNTER — Ambulatory Visit (INDEPENDENT_AMBULATORY_CARE_PROVIDER_SITE_OTHER): Payer: Medicare Other | Admitting: Osteopathic Medicine

## 2016-03-01 ENCOUNTER — Encounter: Payer: Self-pay | Admitting: Osteopathic Medicine

## 2016-03-01 ENCOUNTER — Telehealth: Payer: Self-pay | Admitting: Osteopathic Medicine

## 2016-03-01 ENCOUNTER — Ambulatory Visit (INDEPENDENT_AMBULATORY_CARE_PROVIDER_SITE_OTHER): Payer: Medicare Other

## 2016-03-01 VITALS — BP 153/83 | HR 84 | Wt 164.0 lb

## 2016-03-01 DIAGNOSIS — M1712 Unilateral primary osteoarthritis, left knee: Secondary | ICD-10-CM | POA: Diagnosis not present

## 2016-03-01 DIAGNOSIS — G20 Parkinson's disease: Secondary | ICD-10-CM | POA: Diagnosis not present

## 2016-03-01 DIAGNOSIS — F458 Other somatoform disorders: Secondary | ICD-10-CM | POA: Diagnosis not present

## 2016-03-01 DIAGNOSIS — M79652 Pain in left thigh: Secondary | ICD-10-CM | POA: Diagnosis not present

## 2016-03-01 DIAGNOSIS — M79662 Pain in left lower leg: Secondary | ICD-10-CM | POA: Diagnosis not present

## 2016-03-01 DIAGNOSIS — M79605 Pain in left leg: Secondary | ICD-10-CM | POA: Diagnosis not present

## 2016-03-01 DIAGNOSIS — M069 Rheumatoid arthritis, unspecified: Secondary | ICD-10-CM | POA: Diagnosis not present

## 2016-03-01 DIAGNOSIS — W19XXXA Unspecified fall, initial encounter: Secondary | ICD-10-CM

## 2016-03-01 DIAGNOSIS — Y92099 Unspecified place in other non-institutional residence as the place of occurrence of the external cause: Secondary | ICD-10-CM

## 2016-03-01 DIAGNOSIS — M25552 Pain in left hip: Secondary | ICD-10-CM | POA: Diagnosis not present

## 2016-03-01 DIAGNOSIS — Y92009 Unspecified place in unspecified non-institutional (private) residence as the place of occurrence of the external cause: Secondary | ICD-10-CM

## 2016-03-01 DIAGNOSIS — F329 Major depressive disorder, single episode, unspecified: Secondary | ICD-10-CM | POA: Diagnosis not present

## 2016-03-01 DIAGNOSIS — I10 Essential (primary) hypertension: Secondary | ICD-10-CM | POA: Diagnosis not present

## 2016-03-01 NOTE — Telephone Encounter (Signed)
-----   Message from Emeterio Reeve, DO sent at 03/01/2016  1:31 PM EDT ----- This patient has home health in place (I think [facility] is name of company, as per patient?) for physical therapy and occupational therapy. We will be able to find out what company this is and if they do home fall assessments, and if so can we get this ordered / if not sent referral for home health company that can take care of this? Thanks.

## 2016-03-01 NOTE — Telephone Encounter (Signed)
The old referral in chart is no longer active. I called [facility] and they are currently not doing home health for the Pt but do take her insurance. If you would like to set her up with them just place an external home health referral.

## 2016-03-01 NOTE — Telephone Encounter (Signed)
las. Well some physical therapist was at the patient's house this morning, most recent referral states [facility]? Can we contact them and see if fall prevention services available? Otherwise I know [facility] does because they talked about this at the office recently

## 2016-03-01 NOTE — Progress Notes (Signed)
HPI: Shannon Vincent is a 81 y.o. female who presents to [facility] today for chief complaint of:  Chief Complaint  Patient presents with  . left leg pain    fell 1 week ago     . Context: fell one week ago - mechanical fall - getting home PT and OT, not nursing. Physical therapist made this appt this morning.  . Location: left leg/ankle  . Quality: hurts to bear weight  . Severity:  . Duration:  . Timing:  . Modifying factors: . Assoc signs/symptoms:   Ankle surgery 12/2014 following with ortho  Fall Risk  03/01/2016  Falls in the past year? Yes  Number falls in past yr: 1  Injury with Fall? Yes  Risk Factor Category  High Fall Risk  Risk for fall due to : History of fall(s);Impaired balance/gait;Impaired mobility  Follow up Education provided;Falls evaluation completed;Falls prevention discussed       Past medical, social and family history reviewed: Past Medical History  Diagnosis Date  . Parkinson disease ([place])     WF neuro  . Osteoarthritis   . Arthritis     asteotosis  . Dermatitis     asteotosis  . Cancer [facility])    Past Surgical History  Procedure Laterality Date  . Spine surgery  2004    cervical spine fusion   . Brain surgery  09-10, 04-11    deep brain stimulator  . Ankle fracture surgery  12/2014   Social History  Substance Use Topics  . Smoking status: Never Smoker   . Smokeless tobacco: Not on file  . Alcohol Use: No   No family history on file.  Current Outpatient Prescriptions  Medication Sig Dispense Refill  . acetaminophen (TYLENOL) 325 MG tablet Take 2 tablets (650 mg total) by mouth every 6 (six) hours as needed for mild pain, moderate pain, fever or headache. 90 tablet 1  . ALPRAZolam (XANAX) 0.25 MG tablet Take 1 tablet (0.25 mg total) by mouth at bedtime as needed for anxiety or sleep. USE SPARINGLY 20 tablet 0  . amantadine (SYMMETREL) 100 MG capsule Take 1 capsule (100 mg total) by mouth 3 (three) times  daily. 180 capsule 3  . amantadine (SYMMETREL) 100 MG capsule TAKE ONE CAPSULE BY MOUTH 3 TIMES A DAY 90 capsule 0  . AMBULATORY NON FORMULARY MEDICATION Shower Chair - Diagnosis - Parkinson's disease ICD-10 G20 1 each 0  . calamine lotion Apply 1 application topically as needed for itching. 120 mL 0  . Calcium Carbonate-Vitamin D 600-400 MG-UNIT chew tablet Chew 2 tablets by mouth daily. 60 tablet 12  . carbidopa-levodopa (SINEMET IR) 25-100 MG tablet TAKE 1 & 1/2 TABLETS BY MOUTH SIX TIMES DAILY 250 tablet 0  . cetirizine (ZYRTEC) 5 MG tablet Take 1 tablet (5 mg total) by mouth daily as needed for allergies or rhinitis. 30 tablet 12  . escitalopram (LEXAPRO) 5 MG tablet Take 1 tablet (5 mg total) by mouth daily.    Marland Kitchen levothyroxine (SYNTHROID, LEVOTHROID) 88 MCG tablet TAKE ONE TABLET BY MOUTH DAILY BEFORE BREAKFAST 30 tablet 0  . lisinopril (PRINIVIL,ZESTRIL) 20 MG tablet Take 1 tablet (20 mg total) by mouth daily. 90 tablet 3  . methylPREDNISolone (MEDROL DOSEPAK) 4 MG TBPK tablet 6-day pack as directed 21 tablet 0  . mirtazapine (REMERON) 15 MG tablet Take 1 tablet (15 mg total) by mouth at bedtime. 30 tablet 0  . pantoprazole (PROTONIX) 40 MG tablet TAKE ONE TABLET BY  MOUTH EVERY DAY 30 tablet 0  . permethrin (ELIMITE) 5 % cream Apply 1 application topically once. Apply to entire body from neck to feet and leave on overnight, avoid face. 60 g 3  . potassium chloride (KLOR-CON) 20 MEQ packet USE 1 PACKET BY MOUTH DAILY 30 packet 0  . potassium chloride SA (K-DUR,KLOR-CON) 20 MEQ tablet TAKE 1 TABLET BY MOUTH DAILY 30 tablet 0  . pramipexole (MIRAPEX) 0.5 MG tablet Take 1 tablet (0.5 mg total) by mouth 5 (five) times daily. APPOINTMENT NEEDED FOR FUTURE REFILLS 90 tablet 0  . SENEXON-S 8.6-50 MG tablet TAKE 2 TABLETS BY MOUTH AT BEDTIME AS NEEDED FOR MILD CONSTIPATION 60 tablet 0  . traZODone (DESYREL) 50 MG tablet Take 0.5-1 tablets (25-50 mg total) by mouth at bedtime as needed for sleep. 30  tablet 1   No current facility-administered medications for this visit.   Allergies  Allergen Reactions  . Codeine Nausea And Vomiting  . Morphine Diarrhea and Nausea And Vomiting  . Latex Rash      Review of Systems: CONSTITUTIONAL:  No  fever, no chills, No recent illness, No unintentional weight changes HEAD/EYES/EARS/NOSE/THROAT: No  headache, no vision change, no hearing change, No sore throat, No  sinus pressure CARDIAC: No  chest pain, No  pressure, No palpitations, No  orthopnea RESPIRATORY: No  cough, No  shortness of breath/wheeze GASTROINTESTINAL: No  nausea, No  vomiting, No  abdominal pain, No  blood in stool, No  diarrhea, No  constipation  MUSCULOSKELETAL: No  myalgia/arthralgia GENITOURINARY: No  incontinence, No  abnormal genital bleeding/discharge SKIN: No  rash/wounds/concerning lesions HEM/ONC: No  easy bruising/bleeding, No  abnormal lymph node ENDOCRINE: No polyuria/polydipsia/polyphagia, No  heat/cold intolerance  NEUROLOGIC: No  weakness, No  dizziness, No  slurred speech PSYCHIATRIC: No  concerns with depression, No  concerns with anxiety, No sleep problems  Exam:  There were no vitals taken for this visit. Constitutional: VS see above. General Appearance: alert, well-developed, well-nourished, NAD,  Unsteady on feet, chronic tremor Ears, Nose, Mouth, Throat: MMM Neck: No masses, trachea midline. Respiratory: Normal respiratory effort. no wheeze, no rhonchi, no rales Cardiovascular: S1/S2 normal, no murmur, no rub/gallop auscultated. RRR. No lower extremity edema. Gastrointestinal: Nontender, no masses. Musculoskeletal: Gait antlagic, unsteady - uses walker to assist ambulation, (+) bruising on L lateral thigh, no significant tenderness to L hip/knee, no joint efusion noted. No clubbing/cyanosis of digits.  Neurological: No cranial nerve deficit on limited exam Skin: warm, dry, intact.    Dg Hip Unilat W Or W/o Pelvis 2-3 Views Left  03/01/2016   CLINICAL DATA:  81 year old female fell on carpet covered concrete floor when week ago a landing on the left hip. Pain and bruising. Initial encounter. EXAM: DG HIP (WITH OR WITHOUT PELVIS) 2-3V LEFT COMPARISON:  None. FINDINGS: Femoral heads are normally located. Hip joint spaces are normal for age. Pelvis intact. SI joints appear within normal limits. Advanced lower lumbar disc and endplate degeneration is partially visible. Grossly intact proximal right femur. Intact proximal left femur. IMPRESSION: No acute fracture or dislocation identified about the left hip or pelvis. Electronically Signed   By: Genevie Ann M.D.   On: 03/01/2016 11:50   Dg Femur Min 2 Views Left  03/01/2016  CLINICAL DATA:  81 year old female fell on carpet covered concrete floor when week ago a landing on the left hip. Pain and bruising. Initial encounter. EXAM: LEFT FEMUR 2 VIEWS COMPARISON:  Left hip series from today reported separately. FINDINGS:  Bone mineralization is within normal limits for age. Visible left hemipelvis appears intact. Proximal left femur intact. Left femoral shaft intact. Distal left femur appears intact status post left total knee arthroplasty. Knee hardware components appear intact and normally aligned. There is a partially visible healed deformity of the proximal left fibula shaft. IMPRESSION: No acute fracture or osseous abnormality identified about the left femur. Electronically Signed   By: Genevie Ann M.D.   On: 03/01/2016 11:51     ASSESSMENT/PLAN: Patient states pain is improving, she is not too worried about her symptoms at this point and will continue PT. Advised can take Tylenol for pain.   Left leg pain - low suspicion for fracture based on symptoms but will get XR given hx osteoporosis and antalgic gait - Plan: DG HIP UNILAT W OR W/O PELVIS 2-3 VIEWS LEFT, DG FEMUR MIN 2 VIEWS LEFT  Fall at home, initial encounter - Will involve home PT in fall prevention, see if home health offers fall assessment and  prevention services or find these elsewhere for patient - Plan: DG HIP UNILAT W OR W/O PELVIS 2-3 VIEWS LEFT, DG FEMUR MIN 2 VIEWS LEFT     All questions were answered. Visit summary with medication list and pertinent instructions was printed for patient to review. ER/RTC precautions were reviewed with the patient. Return if symptoms worsen or fail to improve.

## 2016-03-01 NOTE — Telephone Encounter (Signed)
Attempted to contact Pt's daughter on home and mobile number, no answer. Waiting for return clinic callback.

## 2016-03-02 DIAGNOSIS — Z7982 Long term (current) use of aspirin: Secondary | ICD-10-CM | POA: Diagnosis not present

## 2016-03-02 DIAGNOSIS — E785 Hyperlipidemia, unspecified: Secondary | ICD-10-CM | POA: Diagnosis not present

## 2016-03-02 DIAGNOSIS — L821 Other seborrheic keratosis: Secondary | ICD-10-CM | POA: Diagnosis not present

## 2016-03-02 DIAGNOSIS — M069 Rheumatoid arthritis, unspecified: Secondary | ICD-10-CM | POA: Diagnosis not present

## 2016-03-02 DIAGNOSIS — G20 Parkinson's disease: Secondary | ICD-10-CM | POA: Diagnosis not present

## 2016-03-02 NOTE — Telephone Encounter (Signed)
Ok. Thanks!

## 2016-03-03 ENCOUNTER — Other Ambulatory Visit: Payer: Self-pay | Admitting: Osteopathic Medicine

## 2016-03-03 DIAGNOSIS — G20 Parkinson's disease: Secondary | ICD-10-CM | POA: Diagnosis not present

## 2016-03-03 DIAGNOSIS — M069 Rheumatoid arthritis, unspecified: Secondary | ICD-10-CM | POA: Diagnosis not present

## 2016-03-03 DIAGNOSIS — M1712 Unilateral primary osteoarthritis, left knee: Secondary | ICD-10-CM | POA: Diagnosis not present

## 2016-03-03 DIAGNOSIS — F329 Major depressive disorder, single episode, unspecified: Secondary | ICD-10-CM | POA: Diagnosis not present

## 2016-03-03 DIAGNOSIS — I10 Essential (primary) hypertension: Secondary | ICD-10-CM | POA: Diagnosis not present

## 2016-03-03 DIAGNOSIS — F458 Other somatoform disorders: Secondary | ICD-10-CM | POA: Diagnosis not present

## 2016-03-06 NOTE — Telephone Encounter (Signed)
Patient request refill for Symmetrel 100 mg. Please advise if ok to refill. Lysette Lindenbaum,CMA

## 2016-03-07 DIAGNOSIS — G20 Parkinson's disease: Secondary | ICD-10-CM | POA: Diagnosis not present

## 2016-03-07 DIAGNOSIS — F458 Other somatoform disorders: Secondary | ICD-10-CM | POA: Diagnosis not present

## 2016-03-07 DIAGNOSIS — F329 Major depressive disorder, single episode, unspecified: Secondary | ICD-10-CM | POA: Diagnosis not present

## 2016-03-07 DIAGNOSIS — I10 Essential (primary) hypertension: Secondary | ICD-10-CM | POA: Diagnosis not present

## 2016-03-07 DIAGNOSIS — M069 Rheumatoid arthritis, unspecified: Secondary | ICD-10-CM | POA: Diagnosis not present

## 2016-03-07 DIAGNOSIS — M1712 Unilateral primary osteoarthritis, left knee: Secondary | ICD-10-CM | POA: Diagnosis not present

## 2016-03-07 NOTE — Telephone Encounter (Signed)
These meds are managed by her neurologists at [facility] - would have pharmacy send request there

## 2016-03-09 DIAGNOSIS — F329 Major depressive disorder, single episode, unspecified: Secondary | ICD-10-CM | POA: Diagnosis not present

## 2016-03-09 DIAGNOSIS — M1712 Unilateral primary osteoarthritis, left knee: Secondary | ICD-10-CM | POA: Diagnosis not present

## 2016-03-09 DIAGNOSIS — M069 Rheumatoid arthritis, unspecified: Secondary | ICD-10-CM | POA: Diagnosis not present

## 2016-03-09 DIAGNOSIS — F458 Other somatoform disorders: Secondary | ICD-10-CM | POA: Diagnosis not present

## 2016-03-09 DIAGNOSIS — G20 Parkinson's disease: Secondary | ICD-10-CM | POA: Diagnosis not present

## 2016-03-09 DIAGNOSIS — I10 Essential (primary) hypertension: Secondary | ICD-10-CM | POA: Diagnosis not present

## 2016-03-14 DIAGNOSIS — M1712 Unilateral primary osteoarthritis, left knee: Secondary | ICD-10-CM | POA: Diagnosis not present

## 2016-03-14 DIAGNOSIS — I10 Essential (primary) hypertension: Secondary | ICD-10-CM | POA: Diagnosis not present

## 2016-03-14 DIAGNOSIS — F458 Other somatoform disorders: Secondary | ICD-10-CM | POA: Diagnosis not present

## 2016-03-14 DIAGNOSIS — M069 Rheumatoid arthritis, unspecified: Secondary | ICD-10-CM | POA: Diagnosis not present

## 2016-03-14 DIAGNOSIS — G20 Parkinson's disease: Secondary | ICD-10-CM | POA: Diagnosis not present

## 2016-03-14 DIAGNOSIS — F329 Major depressive disorder, single episode, unspecified: Secondary | ICD-10-CM | POA: Diagnosis not present

## 2016-03-15 DIAGNOSIS — F329 Major depressive disorder, single episode, unspecified: Secondary | ICD-10-CM | POA: Diagnosis not present

## 2016-03-15 DIAGNOSIS — F458 Other somatoform disorders: Secondary | ICD-10-CM | POA: Diagnosis not present

## 2016-03-15 DIAGNOSIS — M1712 Unilateral primary osteoarthritis, left knee: Secondary | ICD-10-CM | POA: Diagnosis not present

## 2016-03-15 DIAGNOSIS — G20 Parkinson's disease: Secondary | ICD-10-CM | POA: Diagnosis not present

## 2016-03-15 DIAGNOSIS — I10 Essential (primary) hypertension: Secondary | ICD-10-CM | POA: Diagnosis not present

## 2016-03-15 DIAGNOSIS — M069 Rheumatoid arthritis, unspecified: Secondary | ICD-10-CM | POA: Diagnosis not present

## 2016-03-21 ENCOUNTER — Other Ambulatory Visit: Payer: Self-pay | Admitting: Osteopathic Medicine

## 2016-03-22 ENCOUNTER — Other Ambulatory Visit: Payer: Self-pay | Admitting: Osteopathic Medicine

## 2016-03-22 DIAGNOSIS — M1712 Unilateral primary osteoarthritis, left knee: Secondary | ICD-10-CM | POA: Diagnosis not present

## 2016-03-22 DIAGNOSIS — G20 Parkinson's disease: Secondary | ICD-10-CM | POA: Diagnosis not present

## 2016-03-22 DIAGNOSIS — M069 Rheumatoid arthritis, unspecified: Secondary | ICD-10-CM | POA: Diagnosis not present

## 2016-03-22 DIAGNOSIS — F458 Other somatoform disorders: Secondary | ICD-10-CM | POA: Diagnosis not present

## 2016-03-22 DIAGNOSIS — F329 Major depressive disorder, single episode, unspecified: Secondary | ICD-10-CM | POA: Diagnosis not present

## 2016-03-22 DIAGNOSIS — I10 Essential (primary) hypertension: Secondary | ICD-10-CM | POA: Diagnosis not present

## 2016-03-23 ENCOUNTER — Encounter: Payer: Self-pay | Admitting: Osteopathic Medicine

## 2016-03-23 ENCOUNTER — Ambulatory Visit (INDEPENDENT_AMBULATORY_CARE_PROVIDER_SITE_OTHER): Payer: Medicare Other

## 2016-03-23 ENCOUNTER — Ambulatory Visit (INDEPENDENT_AMBULATORY_CARE_PROVIDER_SITE_OTHER): Payer: Medicare Other | Admitting: Osteopathic Medicine

## 2016-03-23 VITALS — BP 147/91 | HR 66 | Ht 60.0 in | Wt 164.0 lb

## 2016-03-23 DIAGNOSIS — Y92099 Unspecified place in other non-institutional residence as the place of occurrence of the external cause: Secondary | ICD-10-CM

## 2016-03-23 DIAGNOSIS — W19XXXA Unspecified fall, initial encounter: Secondary | ICD-10-CM

## 2016-03-23 DIAGNOSIS — H6123 Impacted cerumen, bilateral: Secondary | ICD-10-CM

## 2016-03-23 DIAGNOSIS — S0990XA Unspecified injury of head, initial encounter: Secondary | ICD-10-CM

## 2016-03-23 DIAGNOSIS — G20 Parkinson's disease: Secondary | ICD-10-CM

## 2016-03-23 DIAGNOSIS — S0003XA Contusion of scalp, initial encounter: Secondary | ICD-10-CM

## 2016-03-23 DIAGNOSIS — Y92009 Unspecified place in unspecified non-institutional (private) residence as the place of occurrence of the external cause: Secondary | ICD-10-CM

## 2016-03-23 DIAGNOSIS — M79605 Pain in left leg: Secondary | ICD-10-CM

## 2016-03-23 DIAGNOSIS — J019 Acute sinusitis, unspecified: Secondary | ICD-10-CM | POA: Diagnosis not present

## 2016-03-23 DIAGNOSIS — X58XXXA Exposure to other specified factors, initial encounter: Secondary | ICD-10-CM

## 2016-03-23 MED ORDER — AMOXICILLIN-POT CLAVULANATE 875-125 MG PO TABS: 1.0000 | ORAL_TABLET | Freq: Two times a day (BID) | ORAL | Status: DC

## 2016-03-23 NOTE — Progress Notes (Signed)
HPI: Shannon Vincent is a 81 y.o. female who presents to [facility] today for chief complaint of:  Chief Complaint  Patient presents with  . Fall    FALL AND FACIAL PAIN/ HEAD TRAUMA . Context: fall at home - tries to turn and feels like her body moves but feet didn't follow. Hx parkinson. No LOC.  . Location: hurt head and L knee, face is all bruised.  . Duration: happened 4 days ago  . Assoc signs/symptoms: no headache or vision changes, facial pain where bruised, persistent unsteadiness due to parkinsons, she is participating in home PT for gait training, neuro concerned for possible need different walker or consider automated wheelchair. Left knee pain/abrasion as well.   Past medical, social and family history reviewed: Past Medical History  Diagnosis Date  . Parkinson disease ([place])     WF neuro  . Osteoarthritis   . Arthritis     asteotosis  . Dermatitis     asteotosis  . Cancer [facility])    Past Surgical History  Procedure Laterality Date  . Spine surgery  2004    cervical spine fusion   . Brain surgery  09-10, 04-11    deep brain stimulator  . Ankle fracture surgery  12/2014   Social History  Substance Use Topics  . Smoking status: Never Smoker   . Smokeless tobacco: Not on file  . Alcohol Use: No   No family history on file.  Current Outpatient Prescriptions  Medication Sig Dispense Refill  . acetaminophen (TYLENOL) 325 MG tablet Take 2 tablets (650 mg total) by mouth every 6 (six) hours as needed for mild pain, moderate pain, fever or headache. 90 tablet 1  . amantadine (SYMMETREL) 100 MG capsule TAKE ONE CAPSULE BY MOUTH 3 TIMES A DAY 90 capsule 0  . AMBULATORY NON FORMULARY MEDICATION Shower Chair - Diagnosis - Parkinson's disease ICD-10 G20 1 each 0  . Calcium Carbonate-Vitamin D 600-400 MG-UNIT chew tablet Chew 2 tablets by mouth daily. 60 tablet 12  . carbidopa-levodopa (SINEMET IR) 25-100 MG tablet TAKE 1 & 1/2 TABLETS BY MOUTH  SIX TIMES DAILY 250 tablet 0  . cetirizine (ZYRTEC) 5 MG tablet Take 1 tablet (5 mg total) by mouth daily as needed for allergies or rhinitis. 30 tablet 12  . escitalopram (LEXAPRO) 5 MG tablet Take 1 tablet (5 mg total) by mouth daily.    . folic acid (FOLVITE) 1 MG tablet TAKE ONE TABLET BY MOUTH EVERY DAY 30 tablet 0  . levothyroxine (SYNTHROID, LEVOTHROID) 88 MCG tablet TAKE ONE TABLET BY MOUTH DAILY BEFORE BREAKFAST 30 tablet 0  . lisinopril (PRINIVIL,ZESTRIL) 20 MG tablet Take 1 tablet (20 mg total) by mouth daily. 90 tablet 3  . mirtazapine (REMERON) 15 MG tablet Take 1 tablet (15 mg total) by mouth at bedtime. 30 tablet 0  . pantoprazole (PROTONIX) 40 MG tablet TAKE ONE TABLET BY MOUTH EVERY DAY 30 tablet 0  . potassium chloride SA (K-DUR,KLOR-CON) 20 MEQ tablet TAKE 1 TABLET BY MOUTH DAILY 30 tablet 0  . pramipexole (MIRAPEX) 0.5 MG tablet Take 1 tablet (0.5 mg total) by mouth 5 (five) times daily. APPOINTMENT NEEDED FOR FUTURE REFILLS 90 tablet 0  . SENEXON-S 8.6-50 MG tablet TAKE 2 TABLETS BY MOUTH AT BEDTIME AS NEEDED FOR MILD CONSTIPATION 60 tablet 0  . traZODone (DESYREL) 50 MG tablet Take 0.5-1 tablets (25-50 mg total) by mouth at bedtime as needed for sleep. 30 tablet 1  . vitamin  B-12 (CYANOCOBALAMIN) 1000 MCG tablet Take one by mouth every day 30 tablet 0   No current facility-administered medications for this visit.   Allergies  Allergen Reactions  . Codeine Nausea And Vomiting  . Morphine Diarrhea and Nausea And Vomiting  . Latex Rash      Review of Systems: CONSTITUTIONAL:  No  fever, no chills, No recent illness HEAD/EYES/EARS/NOSE/THROAT: No  Headache except where trauma, no vision change, no hearing change, No sore throat CARDIAC: No  chest pain, No  pressure, No palpitations, RESPIRATORY: No  cough, No  shortness of breath/wheeze GASTROINTESTINAL: No  nausea, No  vomiting, No  abdominal pain, No  blood in stool MUSCULOSKELETAL: (+) myalgia/arthralgia SKIN: No   rash/wounds/concerning lesions other than bump on forehead and briusing on faceAs well as abrasion on left knee NEUROLOGIC: (+) generalized weakness, No  Dizziness   Exam:  BP 147/91 mmHg  Pulse 66  Ht 5' (1.524 m)  Wt 164 lb (74.39 kg)  BMI 32.03 kg/m2 Constitutional: VS see above. General Appearance: alert, well-developed, well-nourished, NAD, obvious facial briusing Eyes: Normal lids and conjunctive, non-icteric sclera, PERRLA, EOMI Ears, Nose, Mouth, Throat: MMM, Normal external inspection ears/nares/mouth/lips/gums, TM obscured by cerumen bilateraly. Flushed by nurse and cerumen removed by curettage, tympanic membranes normal bilaterally Pharynx/tonsils no erythema, no exudate. Nasal mucosa normal.  Neck: No masses, trachea midline. No thyroid enlargement. No tenderness/mass appreciated. No lymphadenopathy Respiratory: Normal respiratory effort. no wheeze, no rhonchi, no rales Cardiovascular: S1/S2 normal, no murmur, no rub/gallop auscultated. RRR. No lower extremity edema. Musculoskeletal: Gait chronic unsteady - ambulates with walker. Neurological: No cranial nerve deficit on limited exam. Motor and sensation intact and symmetric. Cerebellar reflexes intact, Skin: warm, dry. No rash/ulcer. No concerning nevi or subq nodules on limited exam.  Bruising on face, subcutaneous hematoma on 4 head, left knee abrasion Psychiatric: Normal judgment/insight. Normal mood and affect. Oriented x3.    No results found for this or any previous visit (from the past 72 hour(s)).  Ct Head Wo Contrast  03/23/2016  CLINICAL DATA:  Head injury, trauma, bruising to the face and left maxillary region EXAM: CT HEAD WITHOUT CONTRAST CT MAXILLOFACIAL WITHOUT CONTRAST TECHNIQUE: Multidetector CT imaging of the head and maxillofacial structures were performed using the standard protocol without intravenous contrast. Multiplanar CT image reconstructions of the maxillofacial structures were also generated.  COMPARISON:  None. FINDINGS: CT HEAD FINDINGS Brain: No evidence of acute infarction, hemorrhage, extra-axial collection, ventriculomegaly, or mass effect. Bilateral deep brain stimulator probes are noted adjacent to the thalamus. Generalized cerebral atrophy. Periventricular white matter low attenuation likely secondary to microangiopathy. Vascular: Cerebrovascular atherosclerotic calcifications are noted. Skull: Negative for fracture or focal lesion. Sinuses/Orbits: Visualized portions of the orbits are unremarkable. Visualized portions of the paranasal sinuses and mastoid air cells are unremarkable. Other: Left frontal scalp hematoma. CT MAXILLOFACIAL FINDINGS The globes are intact. The orbital walls are intact. The orbital floors are intact. The maxilla is intact. The mandible is intact. The zygomatic arches are intact. The nasal septum is midline. There is no nasal bone fracture. The temporomandibular joints are normal. There is near complete opacification of the left maxillary sinus. Remainder of the paranasal sinuses are clear. The visualized portions of the mastoid sinuses are well aerated. Bilateral facet arthropathy at C2-3 and C3-4. IMPRESSION: 1. No acute intracranial pathology. 2. No acute osseous injury of the maxillofacial bones. 3. Left frontal scalp hematoma. Electronically Signed   By: Kathreen Devoid   On: 03/23/2016 15:52  Ct Maxillofacial Wo Cm  03/23/2016  CLINICAL DATA:  Head injury, trauma, bruising to the face and left maxillary region EXAM: CT HEAD WITHOUT CONTRAST CT MAXILLOFACIAL WITHOUT CONTRAST TECHNIQUE: Multidetector CT imaging of the head and maxillofacial structures were performed using the standard protocol without intravenous contrast. Multiplanar CT image reconstructions of the maxillofacial structures were also generated. COMPARISON:  None. FINDINGS: CT HEAD FINDINGS Brain: No evidence of acute infarction, hemorrhage, extra-axial collection, ventriculomegaly, or mass effect.  Bilateral deep brain stimulator probes are noted adjacent to the thalamus. Generalized cerebral atrophy. Periventricular white matter low attenuation likely secondary to microangiopathy. Vascular: Cerebrovascular atherosclerotic calcifications are noted. Skull: Negative for fracture or focal lesion. Sinuses/Orbits: Visualized portions of the orbits are unremarkable. Visualized portions of the paranasal sinuses and mastoid air cells are unremarkable. Other: Left frontal scalp hematoma. CT MAXILLOFACIAL FINDINGS The globes are intact. The orbital walls are intact. The orbital floors are intact. The maxilla is intact. The mandible is intact. The zygomatic arches are intact. The nasal septum is midline. There is no nasal bone fracture. The temporomandibular joints are normal. There is near complete opacification of the left maxillary sinus. Remainder of the paranasal sinuses are clear. The visualized portions of the mastoid sinuses are well aerated. Bilateral facet arthropathy at C2-3 and C3-4. IMPRESSION: 1. No acute intracranial pathology. 2. No acute osseous injury of the maxillofacial bones. 3. Left frontal scalp hematoma. Electronically Signed   By: Kathreen Devoid   On: 03/23/2016 15:52    Indication: Cerumen impaction of the ear(s) Medical necessity statement: On physical examination, cerumen impairs clinically significant portions of the external auditory canal, and tympanic membrane. Noted obstructive, copious cerumen that cannot be removed without magnification and instrumentations requiring physician skills Consent: Discussed benefits and risks of procedure and verbal consent obtained Procedure: Patient was prepped for the procedure. Utilized an otoscope to assess and take note of the ear canal, the tympanic membrane, and the presence, amount, and placement of the cerumen. Gentle water irrigation and soft plastic curette was utilized to remove cerumen.  Post procedure examination: shows cerumen was  completely removed. Patient tolerated procedure well. The patient is made aware that they may experience temporary vertigo, temporary hearing loss, and temporary discomfort. If these symptom last for more than 24 hours to call the clinic or proceed to the ED.    ASSESSMENT/PLAN: 5 days out from head injury, my a more significant concern was possible facial fractures but CT was negative as noted above. We'll treat as sinusitis as well given no [place] as noted on CT, consideration that may be blood collected in the sinuses but given that the patient has had symptoms prior to her fall we'll go ahead and trial antibiotics, certainly sinusitis and possibly cerumen impaction may have affected her equilibrium however I think the main issue is ambulatory dysfunction due to Parkinson's/overall debility. Reviewed recent neurology notes, would agree with consideration for automated scooter to reduce risk of falls. Daughter accompanies the patient to her visit as well and is concerned that the patient has taken down some of her railings and other supportive devices in the home and has fallen multiple times over the past year. It also concern for patient using walker and getting in and out of her car as well as first ability while driving. Had a long discussion with the patient, would advise only consider discontinuation of driving for her safety particularly while she is walking across parking lots. There are many resources available to her which  can help with transportation, the patient admits that this is something that she has been thinking about for a while.  Head injury, initial encounter - CT neg fracture - Plan: CT HEAD WO CONTRAST, CT Maxillofacial WO CM  Acute sinusitis, recurrence not specified, unspecified location - unsure infection vs blood collected but was complaining of sinus problems prior to fall, will go ahead and treat - Plan: amoxicillin-clavulanate (AUGMENTIN) 875-125 MG tablet  Cerumen  impaction, bilateral - hearing improved w removal cerumen  Left leg pain - abrasion on L knee, otherwise stable  Fall at home, initial encounter  Parkinson disease [facility])     All questions were answered. Visit summary with medication list and pertinent instructions was printed for patient to review. ER/RTC precautions were reviewed with the patient. Return as needed - if need help with paperwork for scooter or walker, let me know!.  Total time spent 40 minutes, greater than 50% of the visit was face-to-face counseling and coordinating care for diagnosis of multiple falls, head injury, Parkinson's disease, ambulatory dysfunction.

## 2016-03-24 DIAGNOSIS — F458 Other somatoform disorders: Secondary | ICD-10-CM | POA: Diagnosis not present

## 2016-03-24 DIAGNOSIS — M1712 Unilateral primary osteoarthritis, left knee: Secondary | ICD-10-CM | POA: Diagnosis not present

## 2016-03-24 DIAGNOSIS — G20 Parkinson's disease: Secondary | ICD-10-CM | POA: Diagnosis not present

## 2016-03-24 DIAGNOSIS — M069 Rheumatoid arthritis, unspecified: Secondary | ICD-10-CM | POA: Diagnosis not present

## 2016-03-24 DIAGNOSIS — I10 Essential (primary) hypertension: Secondary | ICD-10-CM | POA: Diagnosis not present

## 2016-03-24 DIAGNOSIS — F329 Major depressive disorder, single episode, unspecified: Secondary | ICD-10-CM | POA: Diagnosis not present

## 2016-03-27 DIAGNOSIS — M069 Rheumatoid arthritis, unspecified: Secondary | ICD-10-CM | POA: Diagnosis not present

## 2016-03-27 DIAGNOSIS — F458 Other somatoform disorders: Secondary | ICD-10-CM | POA: Diagnosis not present

## 2016-03-27 DIAGNOSIS — I10 Essential (primary) hypertension: Secondary | ICD-10-CM | POA: Diagnosis not present

## 2016-03-27 DIAGNOSIS — F329 Major depressive disorder, single episode, unspecified: Secondary | ICD-10-CM | POA: Diagnosis not present

## 2016-03-27 DIAGNOSIS — G20 Parkinson's disease: Secondary | ICD-10-CM | POA: Diagnosis not present

## 2016-03-27 DIAGNOSIS — M1712 Unilateral primary osteoarthritis, left knee: Secondary | ICD-10-CM | POA: Diagnosis not present

## 2016-03-29 DIAGNOSIS — F329 Major depressive disorder, single episode, unspecified: Secondary | ICD-10-CM | POA: Diagnosis not present

## 2016-03-29 DIAGNOSIS — F458 Other somatoform disorders: Secondary | ICD-10-CM | POA: Diagnosis not present

## 2016-03-29 DIAGNOSIS — M1712 Unilateral primary osteoarthritis, left knee: Secondary | ICD-10-CM | POA: Diagnosis not present

## 2016-03-29 DIAGNOSIS — I10 Essential (primary) hypertension: Secondary | ICD-10-CM | POA: Diagnosis not present

## 2016-03-29 DIAGNOSIS — G20 Parkinson's disease: Secondary | ICD-10-CM | POA: Diagnosis not present

## 2016-03-29 DIAGNOSIS — M069 Rheumatoid arthritis, unspecified: Secondary | ICD-10-CM | POA: Diagnosis not present

## 2016-03-30 ENCOUNTER — Other Ambulatory Visit: Payer: Self-pay | Admitting: Osteopathic Medicine

## 2016-03-31 ENCOUNTER — Ambulatory Visit (INDEPENDENT_AMBULATORY_CARE_PROVIDER_SITE_OTHER): Payer: Medicare Other | Admitting: Sports Medicine

## 2016-03-31 VITALS — BP 161/98 | HR 74 | Wt 166.0 lb

## 2016-03-31 DIAGNOSIS — S0083XA Contusion of other part of head, initial encounter: Secondary | ICD-10-CM | POA: Diagnosis not present

## 2016-03-31 NOTE — Assessment & Plan Note (Signed)
Needle aspiration of part of the hematoma, followed by incision and drainage for evacuation of the rest of it. Head was strapped with compressive dressing, return in one week.

## 2016-03-31 NOTE — Progress Notes (Signed)
  Subjective:    CC: Forehead hematoma  HPI: This is a pleasant 81 year old female with Parkinson's disease, with deep brain stimulator probes implanted who had a recent fall CT of the head and sinuses were negative for any fracture or intracranial collection, she does have a large forehead hematoma that's causing her significant pain, moderate, persistent without radiation.  Past medical history, Surgical history, Family history not pertinant except as noted below, Social history, Allergies, and medications have been entered into the medical record, reviewed, and no changes needed.   Review of Systems: No fevers, chills, night sweats, weight loss, chest pain, or shortness of breath.   Objective:    General: Well Developed, well nourished, and in no acute distress.  Neuro: Alert and oriented x3, extra-ocular muscles intact, sensation grossly intact.  HEENT: Normocephalic, atraumatic, pupils equal round reactive to light, neck supple, no masses, no lymphadenopathy, thyroid nonpalpable. Visible bruising with a large anterior left forehead hematoma Skin: Warm and dry, no rashes. Cardiac: Regular rate and rhythm, no murmurs rubs or gallops, no lower extremity edema.  Respiratory: Clear to auscultation bilaterally. Not using accessory muscles, speaking in full sentences.  Procedure: Real-time Ultrasound Guided puncture drainage of hematoma Device: GE Logiq E  Verbal informed consent obtained.  Time-out conducted.  Noted no overlying erythema, induration, or other signs of local infection.  Skin prepped in a sterile fashion.  Local anesthesia: Topical Ethyl chloride.  With sterile technique and under real time ultrasound guidance:  18-gauge needle advanced into hematoma, only minimal fluid was able to be aspirated. Completed without difficulty  Pain immediately resolved suggesting accurate placement of the medication.  Advised to call if fevers/chills, erythema, induration, drainage, or  persistent bleeding.  Images permanently stored and available for review in the ultrasound unit.  Impression: Technically successful ultrasound guided injection.  Complicated Incision and drainage of deep forehead hematoma, just superficial to the frontalis muscle and galea aponeurotica. Risks, benefits, and alternatives explained and consent obtained. Time out conducted. Surface cleaned with alcohol. 1cc lidocaine infiltrated around hematoma Adequate anesthesia ensured. Area prepped and draped in a sterile fashion. #11 blade used to make a stab incision into hematoma. Clotted blood expressed with pressure Hemostasis achieved. Head was strapped with compressive dressing Pt stable. Aftercare and follow-up advised.  Impression and Recommendations:

## 2016-04-04 DIAGNOSIS — G20 Parkinson's disease: Secondary | ICD-10-CM | POA: Diagnosis not present

## 2016-04-04 DIAGNOSIS — F329 Major depressive disorder, single episode, unspecified: Secondary | ICD-10-CM | POA: Diagnosis not present

## 2016-04-04 DIAGNOSIS — F458 Other somatoform disorders: Secondary | ICD-10-CM | POA: Diagnosis not present

## 2016-04-04 DIAGNOSIS — M069 Rheumatoid arthritis, unspecified: Secondary | ICD-10-CM | POA: Diagnosis not present

## 2016-04-04 DIAGNOSIS — M1712 Unilateral primary osteoarthritis, left knee: Secondary | ICD-10-CM | POA: Diagnosis not present

## 2016-04-04 DIAGNOSIS — I10 Essential (primary) hypertension: Secondary | ICD-10-CM | POA: Diagnosis not present

## 2016-04-06 DIAGNOSIS — F458 Other somatoform disorders: Secondary | ICD-10-CM | POA: Diagnosis not present

## 2016-04-06 DIAGNOSIS — I10 Essential (primary) hypertension: Secondary | ICD-10-CM | POA: Diagnosis not present

## 2016-04-06 DIAGNOSIS — M069 Rheumatoid arthritis, unspecified: Secondary | ICD-10-CM | POA: Diagnosis not present

## 2016-04-06 DIAGNOSIS — G20 Parkinson's disease: Secondary | ICD-10-CM | POA: Diagnosis not present

## 2016-04-06 DIAGNOSIS — F329 Major depressive disorder, single episode, unspecified: Secondary | ICD-10-CM | POA: Diagnosis not present

## 2016-04-06 DIAGNOSIS — M1712 Unilateral primary osteoarthritis, left knee: Secondary | ICD-10-CM | POA: Diagnosis not present

## 2016-04-07 ENCOUNTER — Ambulatory Visit (INDEPENDENT_AMBULATORY_CARE_PROVIDER_SITE_OTHER): Payer: Medicare Other | Admitting: Sports Medicine

## 2016-04-07 ENCOUNTER — Encounter: Payer: Self-pay | Admitting: Sports Medicine

## 2016-04-07 VITALS — BP 179/106 | HR 93 | Resp 18 | Wt 167.0 lb

## 2016-04-07 DIAGNOSIS — S0083XD Contusion of other part of head, subsequent encounter: Secondary | ICD-10-CM | POA: Diagnosis not present

## 2016-04-07 DIAGNOSIS — I10 Essential (primary) hypertension: Secondary | ICD-10-CM

## 2016-04-07 MED ORDER — VALSARTAN 320 MG PO TABS: 320.0000 mg | ORAL_TABLET | Freq: Every day | ORAL | Status: DC

## 2016-04-07 NOTE — Progress Notes (Signed)
  Subjective:    CC: Follow-up  HPI: I evacuated a large forehead hematoma from this pleasant 81 year old female about a week ago, she returns today feeling much better, she is bruised over her face as expected, but otherwise is happy with how things are going. Her only worry right now is that the battery pack to her deep brain stimulator is running out, she does have a follow-up with her neurosurgeon next month.  Hypertension: Continues to be elevated, CT scan of the head was negative. She is taking lisinopril. Agreeable to switch to something else, no headaches, visual changes, chest pain.  Past medical history, Surgical history, Family history not pertinant except as noted below, Social history, Allergies, and medications have been entered into the medical record, reviewed, and no changes needed.   Review of Systems: No fevers, chills, night sweats, weight loss, chest pain, or shortness of breath.   Objective:    General: Well Developed, well nourished, and in no acute distress.  Neuro: Alert and oriented x3, extra-ocular muscles intact, sensation grossly intact.  HEENT: Normocephalic, atraumatic, pupils equal round reactive to light, neck supple, no masses, no lymphadenopathy, thyroid nonpalpable. Bruising is visible, but the hematoma remained evacuated, incision is well-healed, clean, dry. Skin: Warm and dry, no rashes. Cardiac: Regular rate and rhythm, no murmurs rubs or gallops, no lower extremity edema.  Respiratory: Clear to auscultation bilaterally. Not using accessory muscles, speaking in full sentences.  Impression and Recommendations:    I spent 25 minutes with this patient, greater than 50% was face-to-face time counseling regarding the above diagnoses

## 2016-04-07 NOTE — Assessment & Plan Note (Signed)
Persistently elevated, switching from lisinopril 20 to valsartan 320. Follow-up with PCP next week. Likely needs the addition of amlodipine or switching to valsartan/HCTZ if still elevated. I do not think her elevated blood pressure is the result of Cushing's phenomenon/reflex.

## 2016-04-07 NOTE — Assessment & Plan Note (Signed)
Doing extremely well post hematoma evacuation last week.

## 2016-04-09 DIAGNOSIS — F458 Other somatoform disorders: Secondary | ICD-10-CM | POA: Diagnosis not present

## 2016-04-09 DIAGNOSIS — M069 Rheumatoid arthritis, unspecified: Secondary | ICD-10-CM | POA: Diagnosis not present

## 2016-04-09 DIAGNOSIS — F329 Major depressive disorder, single episode, unspecified: Secondary | ICD-10-CM | POA: Diagnosis not present

## 2016-04-09 DIAGNOSIS — M1712 Unilateral primary osteoarthritis, left knee: Secondary | ICD-10-CM | POA: Diagnosis not present

## 2016-04-09 DIAGNOSIS — G20 Parkinson's disease: Secondary | ICD-10-CM | POA: Diagnosis not present

## 2016-04-09 DIAGNOSIS — I10 Essential (primary) hypertension: Secondary | ICD-10-CM | POA: Diagnosis not present

## 2016-04-10 ENCOUNTER — Other Ambulatory Visit: Payer: Self-pay | Admitting: Osteopathic Medicine

## 2016-04-10 DIAGNOSIS — M1712 Unilateral primary osteoarthritis, left knee: Secondary | ICD-10-CM | POA: Diagnosis not present

## 2016-04-10 DIAGNOSIS — F458 Other somatoform disorders: Secondary | ICD-10-CM | POA: Diagnosis not present

## 2016-04-10 DIAGNOSIS — F329 Major depressive disorder, single episode, unspecified: Secondary | ICD-10-CM | POA: Diagnosis not present

## 2016-04-10 DIAGNOSIS — I10 Essential (primary) hypertension: Secondary | ICD-10-CM | POA: Diagnosis not present

## 2016-04-10 DIAGNOSIS — G20 Parkinson's disease: Secondary | ICD-10-CM | POA: Diagnosis not present

## 2016-04-10 DIAGNOSIS — M069 Rheumatoid arthritis, unspecified: Secondary | ICD-10-CM | POA: Diagnosis not present

## 2016-04-10 NOTE — Telephone Encounter (Signed)
Patient request refill for Senexon 8.6-50 mg  and Mirtazapin 15 mg. Rhonda Cunningham,CMA

## 2016-04-12 DIAGNOSIS — G20 Parkinson's disease: Secondary | ICD-10-CM | POA: Diagnosis not present

## 2016-04-12 DIAGNOSIS — M1712 Unilateral primary osteoarthritis, left knee: Secondary | ICD-10-CM | POA: Diagnosis not present

## 2016-04-12 DIAGNOSIS — M069 Rheumatoid arthritis, unspecified: Secondary | ICD-10-CM | POA: Diagnosis not present

## 2016-04-12 DIAGNOSIS — I10 Essential (primary) hypertension: Secondary | ICD-10-CM | POA: Diagnosis not present

## 2016-04-12 DIAGNOSIS — F329 Major depressive disorder, single episode, unspecified: Secondary | ICD-10-CM | POA: Diagnosis not present

## 2016-04-12 DIAGNOSIS — F458 Other somatoform disorders: Secondary | ICD-10-CM | POA: Diagnosis not present

## 2016-04-13 ENCOUNTER — Ambulatory Visit: Payer: Medicare Other | Admitting: Osteopathic Medicine

## 2016-04-14 ENCOUNTER — Encounter: Payer: Self-pay | Admitting: Osteopathic Medicine

## 2016-04-14 ENCOUNTER — Ambulatory Visit (INDEPENDENT_AMBULATORY_CARE_PROVIDER_SITE_OTHER): Payer: Medicare Other | Admitting: Osteopathic Medicine

## 2016-04-14 VITALS — BP 135/80 | HR 94 | Ht 60.0 in | Wt 165.0 lb

## 2016-04-14 DIAGNOSIS — G20 Parkinson's disease: Secondary | ICD-10-CM | POA: Diagnosis not present

## 2016-04-14 DIAGNOSIS — R82998 Other abnormal findings in urine: Secondary | ICD-10-CM

## 2016-04-14 DIAGNOSIS — R300 Dysuria: Secondary | ICD-10-CM

## 2016-04-14 DIAGNOSIS — I10 Essential (primary) hypertension: Secondary | ICD-10-CM

## 2016-04-14 DIAGNOSIS — R8299 Other abnormal findings in urine: Secondary | ICD-10-CM | POA: Diagnosis not present

## 2016-04-14 NOTE — Patient Instructions (Signed)
I have to strongly recommend that without good control of your Parkinson's symptoms that you should not be driving. If you need ride, and if you're right is ever running late, please just call the office and we should be able to fit you in the same day even if you're running a little bit behind. I would rather you be safe!  At her next visit, let's make that the annual wellness visit, I encourage you to bring her daughter other family member to that visit. If she needs a note to skip work to bring you, I can take care of that for her.

## 2016-04-14 NOTE — Progress Notes (Signed)
HPI: Shannon Vincent is a 81 y.o. Not Hispanic or Latino female  who presents to [facility] today, 04/14/16,  for chief complaint of:  Chief Complaint  Patient presents with  . Follow-up    BLOOD PRESSURE    Hypertension: Follow-up on blood pressure, antihypertensives recently adjusted by Dr. Darene Lamer. Patient doing well otherwise  Parkinson's: Patient notes increasing tremor/unsteadiness. Has an upcoming appointment with the neurologist. The patient is still driving, despite my recommendations that she not be doing so. She tried to get a ride to the clinic yesterday for follow-up however her ride was running late so she had to reschedule her appointment, patient drove herself here today so that she would not be late.  Urinary concern: Patient states that her urine has been increasingly orange, things due to Sinemet, she notes that she has also been taking a bit more of this lately. No dysuria, no hematuria, no fever, no chills.   Past medical, surgical, social and family history reviewed: Past Medical History:  Diagnosis Date  . Arthritis    asteotosis  . Cancer ([place])   . Dermatitis    asteotosis  . Osteoarthritis   . Parkinson disease ([place])    [facility]   Past Surgical History:  Procedure Laterality Date  . ANKLE FRACTURE SURGERY  12/2014  . BRAIN SURGERY  09-10, 04-11   deep brain stimulator  . SPINE SURGERY  2004   cervical spine fusion    Social History  Substance Use Topics  . Smoking status: Never Smoker  . Smokeless tobacco: Not on file  . Alcohol use No   No family history on file.   Current medication list and allergy/intolerance information reviewed:   Current Outpatient Prescriptions  Medication Sig Dispense Refill  . acetaminophen (TYLENOL) 325 MG tablet Take 2 tablets (650 mg total) by mouth every 6 (six) hours as needed for mild pain, moderate pain, fever or headache. 90 tablet 1  . amantadine (SYMMETREL) 100 MG capsule TAKE ONE  CAPSULE BY MOUTH 3 TIMES A DAY (Patient taking differently: PER PATIENT:TAKE TWO  CAPSULES  BY MOUTH EVERY 3 HOURS) 90 capsule 0  . AMBULATORY NON FORMULARY MEDICATION Shower Chair - Diagnosis - Parkinson's disease ICD-10 G20 1 each 0  . amoxicillin-clavulanate (AUGMENTIN) 875-125 MG tablet Take 1 tablet by mouth 2 (two) times daily. 14 tablet 0  . Calcium Carbonate-Vitamin D 600-400 MG-UNIT chew tablet Chew 2 tablets by mouth daily. 60 tablet 12  . carbidopa-levodopa (SINEMET IR) 25-100 MG tablet TAKE 1 & 1/2 TABLETS BY MOUTH SIX TIMES DAILY 250 tablet 0  . cetirizine (ZYRTEC) 5 MG tablet Take 1 tablet (5 mg total) by mouth daily as needed for allergies or rhinitis. 30 tablet 12  . escitalopram (LEXAPRO) 5 MG tablet Take 1 tablet (5 mg total) by mouth daily.    . folic acid (FOLVITE) 1 MG tablet TAKE ONE TABLET BY MOUTH EVERY DAY 30 tablet 0  . levothyroxine (SYNTHROID, LEVOTHROID) 88 MCG tablet TAKE ONE TABLET BY MOUTH DAILY BEFORE BREAKFAST 30 tablet 0  . methotrexate (RHEUMATREX) 2.5 MG tablet TAKE 6 TABLETS BY MOUTH ONCE A WEEK 24 tablet 0  . mirtazapine (REMERON) 15 MG tablet TAKE ONE TABLET BY MOUTH AT BEDTIME 30 tablet 0  . pantoprazole (PROTONIX) 40 MG tablet TAKE ONE TABLET BY MOUTH EVERY DAY 30 tablet 0  . potassium chloride SA (K-DUR,KLOR-CON) 20 MEQ tablet TAKE 1 TABLET BY MOUTH DAILY 30 tablet 0  . pramipexole (  MIRAPEX) 0.5 MG tablet Take 1 tablet (0.5 mg total) by mouth 5 (five) times daily. APPOINTMENT NEEDED FOR FUTURE REFILLS 90 tablet 0  . SENEXON-S 8.6-50 MG tablet TAKE 2 TABLETS BY MOUTH AT BEDTIME AS NEEDED FOR MILD CONSTIPATION 60 tablet 0  . traZODone (DESYREL) 50 MG tablet Take 0.5-1 tablets (25-50 mg total) by mouth at bedtime as needed for sleep. 30 tablet 1  . valsartan (DIOVAN) 320 MG tablet Take 1 tablet (320 mg total) by mouth daily. 30 tablet 3  . vitamin B-12 (CYANOCOBALAMIN) 1000 MCG tablet Take one by mouth every day 30 tablet 0   No current facility-administered  medications for this visit.    Allergies  Allergen Reactions  . Codeine Nausea And Vomiting  . Morphine Diarrhea and Nausea And Vomiting  . Latex Rash      Review of Systems:  Constitutional:  No recent illness, No unintentional weight changes. No significant fatigue.   HEENT: No  headache, no vision change  Cardiac: No  chest pain  Respiratory:  No  shortness of breath  Neurologic: No  weakness, No  dizziness, No  slurred speech/focal weakness/facial droop, parkinsonism is a bit worse  Exam:  BP 135/80   Pulse 94   Ht 5' (1.524 m)   Wt 165 lb (74.8 kg)   BMI 32.22 kg/m  - blood pressure confirmed on manual recheck.  Constitutional: VS see above. General Appearance: alert, well-developed, well-nourished, NAD  Respiratory: Normal respiratory effort. no wheeze, no rhonchi, no rales  Cardiovascular: S1/S2 normal, no murmur, no rub/gallop auscultated. RRR. No lower extremity edema.  Neurological: No cranial nerve deficit on limited exam. Impaired balance/coordination. (+) tremor. Patient is swaying back and forth in the chair in the exam room  Skin: warm, dry, intact.   Psychiatric: Normal judgment/insight. Normal mood and affect. Oriented x3. No thought disorder     ASSESSMENT/PLAN:   Hypertension under good control, continue current medication management.  Parkinsonism appears to be getting worse, I have spoken with the patient in the past that I do not recommend driving, not only because she is likely a danger to other persons on the road, but given her unsteady gait and difficulty ambulating, she is also fairly likely to fall in a parking lot even with handicap parking permit. Patient is advised that she will need to find rides to the office in the future, if her daughter needs to get off work to take her, I'm happy to provide FMLA forms to do this. If patient continues to drive with poorly controlled parkinsonism, would consider revoking license with the [facility],  parkinsonism becomes better controlled, could consider driving again, may need to involve neurology in these recommendations as well.  Patient unable to void in office, no symptoms of UTI, monitor closely, most likely medication side effect.  Essential hypertension, benign  Parkinson disease ([place])  Dark urine - Plan: CANCELED: POCT Urinalysis Dipstick  Dysuria - Plan: CANCELED: Urine Culture     Visit summary with medication list and pertinent instructions was printed for patient to review. All questions at time of visit were answered - patient instructed to contact office with any additional concerns. ER/RTC precautions were reviewed with the patient. Follow-up plan: Return in about 3 months (around 07/15/2016), or sooner if needed, for [place] RECHECK.

## 2016-04-17 DIAGNOSIS — R946 Abnormal results of thyroid function studies: Secondary | ICD-10-CM | POA: Diagnosis not present

## 2016-04-17 DIAGNOSIS — F458 Other somatoform disorders: Secondary | ICD-10-CM | POA: Diagnosis not present

## 2016-04-17 DIAGNOSIS — M069 Rheumatoid arthritis, unspecified: Secondary | ICD-10-CM | POA: Diagnosis not present

## 2016-04-17 DIAGNOSIS — R6889 Other general symptoms and signs: Secondary | ICD-10-CM | POA: Diagnosis not present

## 2016-04-17 DIAGNOSIS — W19XXXA Unspecified fall, initial encounter: Secondary | ICD-10-CM | POA: Diagnosis not present

## 2016-04-17 DIAGNOSIS — I10 Essential (primary) hypertension: Secondary | ICD-10-CM | POA: Diagnosis not present

## 2016-04-17 DIAGNOSIS — R296 Repeated falls: Secondary | ICD-10-CM | POA: Diagnosis not present

## 2016-04-17 DIAGNOSIS — G20 Parkinson's disease: Secondary | ICD-10-CM | POA: Diagnosis not present

## 2016-04-17 DIAGNOSIS — Z462 Encounter for fitting and adjustment of other devices related to nervous system and special senses: Secondary | ICD-10-CM | POA: Diagnosis not present

## 2016-04-17 DIAGNOSIS — F329 Major depressive disorder, single episode, unspecified: Secondary | ICD-10-CM | POA: Diagnosis not present

## 2016-04-17 DIAGNOSIS — M1712 Unilateral primary osteoarthritis, left knee: Secondary | ICD-10-CM | POA: Diagnosis not present

## 2016-04-17 DIAGNOSIS — R0602 Shortness of breath: Secondary | ICD-10-CM | POA: Diagnosis not present

## 2016-04-18 ENCOUNTER — Telehealth: Payer: Self-pay

## 2016-04-18 ENCOUNTER — Ambulatory Visit (INDEPENDENT_AMBULATORY_CARE_PROVIDER_SITE_OTHER): Payer: Medicare Other | Admitting: Osteopathic Medicine

## 2016-04-18 ENCOUNTER — Other Ambulatory Visit: Payer: Self-pay

## 2016-04-18 ENCOUNTER — Encounter: Payer: Self-pay | Admitting: Osteopathic Medicine

## 2016-04-18 ENCOUNTER — Other Ambulatory Visit: Payer: Self-pay | Admitting: Osteopathic Medicine

## 2016-04-18 VITALS — BP 125/60 | HR 82 | Ht 60.0 in | Wt 162.0 lb

## 2016-04-18 DIAGNOSIS — R262 Difficulty in walking, not elsewhere classified: Secondary | ICD-10-CM | POA: Diagnosis not present

## 2016-04-18 DIAGNOSIS — G20 Parkinson's disease: Secondary | ICD-10-CM

## 2016-04-18 DIAGNOSIS — E038 Other specified hypothyroidism: Secondary | ICD-10-CM

## 2016-04-18 MED ORDER — LEVOTHYROXINE SODIUM 100 MCG PO TABS: 100.0000 ug | ORAL_TABLET | Freq: Every day | ORAL | 0 refills | Status: DC

## 2016-04-18 NOTE — Telephone Encounter (Signed)
Patient scheduled for follow up hospital visit.

## 2016-04-18 NOTE — Patient Instructions (Signed)
Plan Come back in 2 weeks Bring paperwork for mobile assist devices (walker or electric scooter/wheelchair) if you have this at home Increase thyroid medication dose and will plan to recheck the levels. Be sure you are taking this medicine in the morning 30+ minutes before breakfast and before taking any other medications.

## 2016-04-18 NOTE — Progress Notes (Signed)
HPI: Shannon Vincent is a 81 y.o. Not Hispanic or Latino female  who presents to [facility] today, 04/18/16,  for chief complaint of:  Chief Complaint  Patient presents with  . Follow-up    SEEN IN HOSPITAL FOR THYROID    Fall/thyroid/parkinsonism: Patient recently in the ER due to fall. Parkinsonism seems to be getting worse, she is noticing more frequent freezing episodes, falling and not having the strength to get up. Patient states that the ER told her that it was due to her thyroid. Records reviewed, TSH was 15, free T4 was low. Patient states that she has been taking the medication as directed at 88 g. We've been going up and down a bit on the dose over the last year or so. Of note, patient has accepted that driving is no longer safe for her, she is no longer driving herself around. Is debating moving somewhere where she can get a little bit more assistance. Is thinking strongly about a motorized wheelchair versus more specialized walker.  Patient is accompanied by daughter who assists with history-taking.   Past medical, surgical, social and family history reviewed: Past Medical History:  Diagnosis Date  . Arthritis    asteotosis  . Cancer ([place])   . Dermatitis    asteotosis  . Osteoarthritis   . Parkinson disease ([place])    [facility]   Past Surgical History:  Procedure Laterality Date  . ANKLE FRACTURE SURGERY  12/2014  . BRAIN SURGERY  09-10, 04-11   deep brain stimulator  . SPINE SURGERY  2004   cervical spine fusion    Social History  Substance Use Topics  . Smoking status: Never Smoker  . Smokeless tobacco: Not on file  . Alcohol use No   No family history on file.   Current medication list and allergy/intolerance information reviewed:   Current Outpatient Prescriptions  Medication Sig Dispense Refill  . acetaminophen (TYLENOL) 325 MG tablet Take 2 tablets (650 mg total) by mouth every 6 (six) hours as needed for mild pain, moderate  pain, fever or headache. 90 tablet 1  . amantadine (SYMMETREL) 100 MG capsule TAKE ONE CAPSULE BY MOUTH 3 TIMES A DAY (Patient taking differently: PER PATIENT:TAKE TWO  CAPSULES  BY MOUTH EVERY 3 HOURS) 90 capsule 0  . AMBULATORY NON FORMULARY MEDICATION Shower Chair - Diagnosis - Parkinson's disease ICD-10 G20 1 each 0  . amoxicillin-clavulanate (AUGMENTIN) 875-125 MG tablet Take 1 tablet by mouth 2 (two) times daily. 14 tablet 0  . Calcium Carbonate-Vitamin D 600-400 MG-UNIT chew tablet Chew 2 tablets by mouth daily. 60 tablet 12  . carbidopa-levodopa (SINEMET IR) 25-100 MG tablet TAKE 1 & 1/2 TABLETS BY MOUTH SIX TIMES DAILY 250 tablet 0  . cetirizine (ZYRTEC) 5 MG tablet Take 1 tablet (5 mg total) by mouth daily as needed for allergies or rhinitis. 30 tablet 12  . escitalopram (LEXAPRO) 5 MG tablet Take 1 tablet (5 mg total) by mouth daily.    . folic acid (FOLVITE) 1 MG tablet TAKE ONE TABLET BY MOUTH EVERY DAY 30 tablet 0  . levothyroxine (SYNTHROID, LEVOTHROID) 88 MCG tablet TAKE ONE TABLET BY MOUTH DAILY BEFORE BREAKFAST 30 tablet 0  . methotrexate (RHEUMATREX) 2.5 MG tablet TAKE 6 TABLETS BY MOUTH ONCE A WEEK 24 tablet 0  . mirtazapine (REMERON) 15 MG tablet TAKE ONE TABLET BY MOUTH AT BEDTIME 30 tablet 0  . pantoprazole (PROTONIX) 40 MG tablet TAKE ONE TABLET BY MOUTH EVERY  DAY 30 tablet 0  . potassium chloride SA (K-DUR,KLOR-CON) 20 MEQ tablet TAKE 1 TABLET BY MOUTH DAILY 30 tablet 0  . pramipexole (MIRAPEX) 0.5 MG tablet Take 1 tablet (0.5 mg total) by mouth 5 (five) times daily. APPOINTMENT NEEDED FOR FUTURE REFILLS 90 tablet 0  . SENEXON-S 8.6-50 MG tablet TAKE 2 TABLETS BY MOUTH AT BEDTIME AS NEEDED FOR MILD CONSTIPATION 60 tablet 0  . traZODone (DESYREL) 50 MG tablet Take 0.5-1 tablets (25-50 mg total) by mouth at bedtime as needed for sleep. 30 tablet 1  . valsartan (DIOVAN) 320 MG tablet Take 1 tablet (320 mg total) by mouth daily. 30 tablet 3  . vitamin B-12 (CYANOCOBALAMIN) 1000  MCG tablet Take one by mouth every day 30 tablet 0   No current facility-administered medications for this visit.    Allergies  Allergen Reactions  . Codeine Nausea And Vomiting  . Morphine Diarrhea and Nausea And Vomiting  . Latex Rash      Review of Systems:  Constitutional:  No  fever, no chills, No recent illness   HEENT: No  headache, no vision chang  Cardiac: No  chest pain, No  pressure,   Respiratory:  No  shortness of breath.  Gastrointestinal: No  abdominal pain,   Musculoskeletal: No new myalgia/arthralgia  Neurologic: (+) weakness generalized, No  dizziness, No  slurred speech/focal weakness/facial droop. (+) tremor, freezing, parksinonism as per HPI   Exam:  BP 125/60   Pulse 82   Ht 5' (1.524 m)   Wt 162 lb (73.5 kg)   BMI 31.64 kg/m  - Blood pressure manually rechecked after abnormal reading by automatic cuff, patient's motor abnormalities may inhibit normal cuff reading, we'll try to get manual readings from now on by myself  Constitutional: VS see above. General Appearance: alert, well-developed, well-nourished, NAD  Eyes: Normal lids and conjunctive, non-icteric sclera  Ears, Nose, Mouth, Throat: MMM, Normal external inspection ears/nares/mouth/lips/gums.  Neck: No masses, trachea midline.   Respiratory: Normal respiratory effort.  Neurological: No cranial nerve deficit on limited exam. Impaired balance/coordination - patient obviously swaying in chair. (+) tremor.   Skin: warm, dry, intact.   Psychiatric: Normal judgment/insight. Normal mood and affect. Oriented x3.      ASSESSMENT/PLAN:   Markedly abnormal thyroid test, given was checked 2 months ago and was in normal range, suspect some abnormality due to 2 recent trauma/physical stressors, however decreased free T4 is also a concern, we'll go ahead and increase the levothyroxine dose slightly, probably will want to checked in the next 2 weeks to make sure the levels are normalizing,  depending on the labs are looking like at that time may need further adjustment of medications but of course wouldn't expect a stable dose to make any difference for about 6 weeks or so.  Ambulatory dysfunction is a concern for this patient. I'm very glad that she has discontinued driving and is seriously considering living situation. She is asked to contact her insurance company regarding the requirements for motorized wheelchair, will follow-up at next visit regarding this issue. Despite what patient remembers hearing from the emergency room, I certainly think that increased falling is more due to poorly controlled/progressive parkinsonism.   Patient requests referral to different neurologist, she has been a bit frustrated seeing her current neurologist - she's that she has mostly been seeing their physician's assistant or resident/students and has a hard time getting in to see her neurologist. Would also like to go somewhere little bit closer to  home if possible. We'll work on alternative referral for a second opinion/patient's friends  Other specified hypothyroidism  Parkinson disease ([place]) - Plan: Ambulatory referral to Neurology  Ambulatory dysfunction     Visit summary with medication list and pertinent instructions was printed for patient to review. All questions at time of visit were answered - patient instructed to contact office with any additional concerns. ER/RTC precautions were reviewed with the patient. Follow-up plan: Return in about 2 weeks (around 05/02/2016), or sooner if needed, for BLOOD PRESSURE RECHECK, [place], FOLLOW UP PARKINSONS.  Note: Total time spent 25 minutes, greater than 50% of the visit was spent face-to-face counseling and coordinating care for the following: The primary encounter diagnosis was Other specified hypothyroidism. Diagnoses of Parkinson disease ([place]) and Ambulatory dysfunction were also pertinent to this visit.Marland Kitchen

## 2016-04-19 ENCOUNTER — Telehealth: Payer: Self-pay | Admitting: Osteopathic Medicine

## 2016-04-19 NOTE — Telephone Encounter (Signed)
-----   Message from Emeterio Reeve, DO sent at 04/18/2016  5:05 PM EDT ----- Regarding: Question about assistive ambulation devices This patient needs motorized wheelchair, how do we go about obtaining approval for this/necessary paperwork? Is there a home health company or medical supply company that can assist [place] with this or does this patient have to contact their insurance company directly? Thanks!

## 2016-04-19 NOTE — Telephone Encounter (Signed)
From what I understand, the Pt will need to contact their insurance and see what companies will accept their insurance. Then a representative from that company will need to come in for a visit with the Pt to see PCP. Measurements will be obtained and forms will be completed at that time.

## 2016-04-20 ENCOUNTER — Telehealth: Payer: Self-pay

## 2016-04-20 ENCOUNTER — Emergency Department (HOSPITAL_COMMUNITY)
Admission: EM | Admit: 2016-04-20 | Discharge: 2016-04-21 | Disposition: A | Discharge status: Home / Self Care | Payer: Medicare Other | Attending: Emergency Medicine | Admitting: Emergency Medicine

## 2016-04-20 ENCOUNTER — Emergency Department (HOSPITAL_COMMUNITY): Payer: Medicare Other

## 2016-04-20 ENCOUNTER — Encounter (HOSPITAL_COMMUNITY): Payer: Self-pay | Admitting: *Deleted

## 2016-04-20 DIAGNOSIS — Z79899 Other long term (current) drug therapy: Secondary | ICD-10-CM | POA: Diagnosis not present

## 2016-04-20 DIAGNOSIS — G20 Parkinson's disease: Secondary | ICD-10-CM | POA: Diagnosis not present

## 2016-04-20 DIAGNOSIS — R531 Weakness: Secondary | ICD-10-CM | POA: Diagnosis not present

## 2016-04-20 DIAGNOSIS — E038 Other specified hypothyroidism: Secondary | ICD-10-CM | POA: Insufficient documentation

## 2016-04-20 DIAGNOSIS — R079 Chest pain, unspecified: Secondary | ICD-10-CM | POA: Diagnosis not present

## 2016-04-20 DIAGNOSIS — R296 Repeated falls: Secondary | ICD-10-CM | POA: Insufficient documentation

## 2016-04-20 DIAGNOSIS — Z859 Personal history of malignant neoplasm, unspecified: Secondary | ICD-10-CM | POA: Insufficient documentation

## 2016-04-20 DIAGNOSIS — M6281 Muscle weakness (generalized): Secondary | ICD-10-CM | POA: Insufficient documentation

## 2016-04-20 DIAGNOSIS — R0602 Shortness of breath: Secondary | ICD-10-CM | POA: Diagnosis not present

## 2016-04-20 DIAGNOSIS — Z91040 Latex allergy status: Secondary | ICD-10-CM | POA: Diagnosis not present

## 2016-04-20 DIAGNOSIS — R05 Cough: Secondary | ICD-10-CM | POA: Diagnosis not present

## 2016-04-20 LAB — URINALYSIS, ROUTINE W REFLEX MICROSCOPIC
BILIRUBIN URINE: NEGATIVE
GLUCOSE, UA: NEGATIVE mg/dL
HGB URINE DIPSTICK: NEGATIVE
KETONES UR: 15 mg/dL — AB
Leukocytes, UA: NEGATIVE
Nitrite: NEGATIVE
PH: 5.5 (ref 5.0–8.0)
PROTEIN: NEGATIVE mg/dL
Specific Gravity, Urine: 1.028 (ref 1.005–1.030)

## 2016-04-20 LAB — BASIC METABOLIC PANEL
Anion gap: 6 (ref 5–15)
BUN: 22 mg/dL — AB (ref 6–20)
CHLORIDE: 110 mmol/L (ref 101–111)
CO2: 25 mmol/L (ref 22–32)
CREATININE: 1.09 mg/dL — AB (ref 0.44–1.00)
Calcium: 9.1 mg/dL (ref 8.9–10.3)
GFR calc Af Amer: 55 mL/min — ABNORMAL LOW (ref >60)
GFR calc non Af Amer: 47 mL/min — ABNORMAL LOW (ref >60)
Glucose, Bld: 125 mg/dL — ABNORMAL HIGH (ref 65–99)
Potassium: 3.5 mmol/L (ref 3.5–5.1)
Sodium: 141 mmol/L (ref 135–145)

## 2016-04-20 LAB — HEPATIC FUNCTION PANEL
ALK PHOS: 74 U/L (ref 38–126)
AST: 19 U/L (ref 15–41)
Albumin: 3.7 g/dL (ref 3.5–5.0)
BILIRUBIN TOTAL: 0.5 mg/dL (ref 0.3–1.2)
Bilirubin, Direct: <0.1 mg/dL — ABNORMAL LOW (ref 0.1–0.5)
TOTAL PROTEIN: 6.5 g/dL (ref 6.5–8.1)

## 2016-04-20 LAB — CBC
HCT: 39.5 % (ref 36.0–46.0)
Hemoglobin: 12.4 g/dL (ref 12.0–15.0)
MCH: 31 pg (ref 26.0–34.0)
MCHC: 31.4 g/dL (ref 30.0–36.0)
MCV: 98.8 fL (ref 78.0–100.0)
PLATELETS: 343 10*3/uL (ref 150–400)
RBC: 4 MIL/uL (ref 3.87–5.11)
RDW: 16.9 % — AB (ref 11.5–15.5)
WBC: 8.3 10*3/uL (ref 4.0–10.5)

## 2016-04-20 LAB — TSH: TSH: 12.593 u[IU]/mL — ABNORMAL HIGH (ref 0.350–4.500)

## 2016-04-20 LAB — T4, FREE: Free T4: 0.99 ng/dL (ref 0.61–1.12)

## 2016-04-20 MED ORDER — PANTOPRAZOLE SODIUM 40 MG PO TBEC
40.0000 mg | DELAYED_RELEASE_TABLET | Freq: Every day | ORAL | Status: DC
  Administered 2016-04-21: 40 mg via ORAL
  Filled 2016-04-20: qty 1

## 2016-04-20 MED ORDER — ESCITALOPRAM OXALATE 10 MG PO TABS
5.0000 mg | ORAL_TABLET | Freq: Every day | ORAL | Status: DC
  Administered 2016-04-21: 5 mg via ORAL
  Filled 2016-04-20: qty 1

## 2016-04-20 MED ORDER — LEVOTHYROXINE SODIUM 100 MCG PO TABS
100.0000 ug | ORAL_TABLET | Freq: Every day | ORAL | Status: DC
  Administered 2016-04-21: 100 ug via ORAL
  Filled 2016-04-20 (×2): qty 1

## 2016-04-20 MED ORDER — TRAZODONE HCL 50 MG PO TABS: 25.0000 mg | ORAL_TABLET | Freq: Every evening | ORAL | Status: DC | PRN

## 2016-04-20 MED ORDER — PRAMIPEXOLE DIHYDROCHLORIDE 0.25 MG PO TABS
0.5000 mg | ORAL_TABLET | Freq: Every day | ORAL | Status: DC
  Administered 2016-04-21 (×3): 0.5 mg via ORAL
  Filled 2016-04-20 (×5): qty 2

## 2016-04-20 MED ORDER — CARBIDOPA-LEVODOPA 25-100 MG PO TABS
1.5000 | ORAL_TABLET | ORAL | Status: DC
  Administered 2016-04-21 (×3): 1.5 via ORAL
  Filled 2016-04-20 (×6): qty 1.5

## 2016-04-20 MED ORDER — AMANTADINE HCL 100 MG PO CAPS
100.0000 mg | ORAL_CAPSULE | Freq: Three times a day (TID) | ORAL | Status: DC
  Administered 2016-04-21 (×2): 100 mg via ORAL
  Filled 2016-04-20 (×3): qty 1

## 2016-04-20 MED ORDER — IRBESARTAN 300 MG PO TABS
300.0000 mg | ORAL_TABLET | Freq: Every day | ORAL | Status: DC
  Administered 2016-04-21 (×2): 300 mg via ORAL
  Filled 2016-04-20 (×2): qty 1

## 2016-04-20 MED ORDER — SODIUM CHLORIDE 0.9 % IV BOLUS (SEPSIS)
1000.0000 mL | Freq: Once | INTRAVENOUS | Status: AC
  Administered 2016-04-20: 1000 mL via INTRAVENOUS

## 2016-04-20 MED ORDER — ACETAMINOPHEN 325 MG PO TABS
650.0000 mg | ORAL_TABLET | Freq: Four times a day (QID) | ORAL | Status: DC | PRN
  Administered 2016-04-21: 650 mg via ORAL
  Filled 2016-04-20: qty 2

## 2016-04-20 MED ORDER — MIRTAZAPINE 15 MG PO TABS: 15.0000 mg | ORAL_TABLET | Freq: Every day | ORAL | Status: DC

## 2016-04-20 NOTE — Telephone Encounter (Signed)
Called and left voice mail on daughter's phone, agree with nurse recommendations to follow-up in emergency room for this issue, I would still like to speak to her regarding motorized wheelchair/etc.

## 2016-04-20 NOTE — Progress Notes (Signed)
Spoke with pt/daughter.  Pt has Parkinson's disease and has been experiencing frequent falls at home.  Pt lives at home alone and is without 24 hr care.  Pt has been receiving HHC, but does not feel safe at home due to her mobility difficulties.  Pt has privately paid for care in the past, and is willing to do the same again.  PT/OT consults are pending.  SNF bed search has been initiated, pt prefers placement in [facility] where she lives.  CSW will continue to follow for placement.

## 2016-04-20 NOTE — ED Provider Notes (Signed)
[facility] DEPT Provider Note   CSN: [id number] Arrival date & time: 04/20/16  1318  First Provider Contact:  None       History   Chief Complaint Chief Complaint  Patient presents with  . Fatigue  . Weakness    HPI Shannon Vincent is a 81 y.o. female.  HPI 81 year old female who presents with generalized weakness. History of Parkinson's disease with DBS and hypothyroidism. Reports 1 year of progressive decline, weakness, fatigue, and difficulty with ambulation. She has been evaluated for this at [facility] 7/31 in setting of recent falls at home. She lives at home along. Has also been recently diagnosed with worsening hypothyroidism and had her medication adjusted by her PCP. Has been on new dose of thyroid medication just today. Continues to feel weak and tired. Has had mild mechanical falls, no recent headstrike or LOC. No injuries. No chest pain, sob, syncope or near syncope, confusion, fever chills, n/v/d, abd pain, urinary symptoms, focal numbness or weakness, vision or speech changes.  Past Medical History:  Diagnosis Date  . Arthritis    asteotosis  . Cancer ([place])   . Dermatitis    asteotosis  . Osteoarthritis   . Parkinson disease [facility])    [facility] neuro    Patient Active Problem List   Diagnosis Date Noted  . Traumatic hematoma of forehead 03/31/2016  . Breast cancer screening 01/03/2016  . UTI (urinary tract infection) 10/29/2015  . Vitamin D deficiency 07/29/2015  . Absolute anemia 07/29/2015  . Thyroid activity decreased 07/29/2015  . Seasonal allergies 07/29/2015  . Postmenopausal 07/29/2015  . S/P surgical manipulation of ankle joint 07/29/2015  . OBESITY, UNSPECIFIED 12/02/2010  . FREQUENCY, URINARY 07/01/2010  . ESSENTIAL HYPERTENSION, BENIGN 04/01/2010  . HYPERLIPIDEMIA 02/22/2010  . ANXIETY STATE, UNSPECIFIED 02/22/2010  . PARKINSON'S DISEASE 02/22/2010  . OSTEOARTHRITIS, MODERATE 02/22/2010    Past Surgical History:  Procedure Laterality Date  . ANKLE  FRACTURE SURGERY  12/2014  . BRAIN SURGERY  09-10, 04-11   deep brain stimulator  . SPINE SURGERY  2004   cervical spine fusion     OB History    No data available       Home Medications    Prior to Admission medications   Medication Sig Start Date End Date Taking? Authorizing Provider  acetaminophen (TYLENOL) 325 MG tablet Take 2 tablets (650 mg total) by mouth every 6 (six) hours as needed for mild pain, moderate pain, fever or headache. 05/17/15  Yes Emeterio Reeve, DO  amantadine (SYMMETREL) 100 MG capsule TAKE ONE CAPSULE BY MOUTH 3 TIMES A DAY 04/19/16  Yes Emeterio Reeve, DO  amantadine (SYMMETREL) 100 MG capsule Take 100 mg by mouth 3 (three) times daily.    Yes Historical Provider, MD  AMBULATORY NON FORMULARY MEDICATION Shower Chair - Diagnosis - Parkinson's disease ICD-10 G20 05/28/15  Yes Emeterio Reeve, DO  amoxicillin-clavulanate (AUGMENTIN) 875-125 MG tablet Take 1 tablet by mouth 2 (two) times daily. 03/23/16  Yes Emeterio Reeve, DO  aspirin EC 81 MG tablet Take 81 mg by mouth daily at 12 noon.   Yes Historical Provider, MD  Calcium Carbonate-Vitamin D 600-400 MG-UNIT chew tablet Chew 2 tablets by mouth daily. Patient taking differently: Chew 1 tablet by mouth daily.  07/30/15  Yes Emeterio Reeve, DO  carbidopa-levodopa (SINEMET IR) 25-100 MG tablet Take 2 tablets by mouth every 3 (three) hours.  11/05/15 11/04/16 Yes Historical Provider, MD  cetirizine (ZYRTEC) 5 MG tablet Take 1 tablet (5 mg  total) by mouth daily as needed for allergies or rhinitis. 07/29/15  Yes Emeterio Reeve, DO  escitalopram (LEXAPRO) 5 MG tablet Take 1 tablet (5 mg total) by mouth daily. 02/10/16  Yes Emeterio Reeve, DO  folic acid (FOLVITE) 1 MG tablet TAKE ONE TABLET BY MOUTH EVERY DAY Patient taking differently: TAKE ONE TABLET (1 mg) BY MOUTH EVERY DAY 03/22/16  Yes Emeterio Reeve, DO  levothyroxine (SYNTHROID, LEVOTHROID) 100 MCG tablet Take 1 tablet (100 mcg total) by mouth daily  before breakfast. 04/18/16  Yes Emeterio Reeve, DO  methotrexate (RHEUMATREX) 2.5 MG tablet TAKE 6 TABLETS BY MOUTH ONCE A WEEK Patient taking differently: TAKE 6 TABLETS BY MOUTH every Saturday 03/30/16  Yes Emeterio Reeve, DO  mirtazapine (REMERON) 15 MG tablet Take 15 mg by mouth at bedtime.    Yes Historical Provider, MD  pantoprazole (PROTONIX) 40 MG tablet Take 40 mg by mouth daily.    Yes Historical Provider, MD  potassium chloride SA (K-DUR,KLOR-CON) 20 MEQ tablet TAKE 1 TABLET BY MOUTH DAILY Patient taking differently: TAKE 1 TABLET (20 mEq) BY MOUTH DAILY 02/09/16  Yes Emeterio Reeve, DO  pramipexole (MIRAPEX) 0.5 MG tablet Take 1 tablet (0.5 mg total) by mouth 5 (five) times daily. APPOINTMENT NEEDED FOR FUTURE REFILLS 09/27/15  Yes Emeterio Reeve, DO  senna-docusate (SENOKOT-S) 8.6-50 MG tablet Take 1 tablet by mouth at bedtime. 04/04/15  Yes Historical Provider, MD  traZODone (DESYREL) 50 MG tablet Take 0.5-1 tablets (25-50 mg total) by mouth at bedtime as needed for sleep. 02/01/16  Yes Emeterio Reeve, DO  valsartan (DIOVAN) 320 MG tablet Take 1 tablet (320 mg total) by mouth daily. 04/07/16  Yes Silverio Decamp, MD  vitamin B-12 (CYANOCOBALAMIN) 1000 MCG tablet Take one by mouth every day Patient taking differently: Take one tablet (1000 mcg) by mouth every day 03/22/16  Yes Emeterio Reeve, DO    Family History History reviewed. No pertinent family history.  Social History Social History  Substance Use Topics  . Smoking status: Never Smoker  . Smokeless tobacco: Not on file  . Alcohol use No     Allergies   Codeine; Morphine; Adhesive [tape]; and Latex   Review of Systems Review of Systems 10/14 systems reviewed and are negative other than those stated in the HPI   Physical Exam Updated Vital Signs BP 179/91   Pulse 72   Temp 98.3 F (36.8 C)   Resp 23   SpO2 97%   Physical Exam Physical Exam  Nursing note and vitals  reviewed. Constitutional: Well developed, well nourished, non-toxic, and in no acute distress Head: Normocephalic and atraumatic.  Mouth/Throat: Oropharynx is clear and moist.  Neck: Normal range of motion. Neck supple.  Cardiovascular: Normal rate and regular rhythm.   Pulmonary/Chest: Effort normal and breath sounds normal.  Abdominal: Soft. There is no tenderness. There is no rebound and no guarding.  Musculoskeletal: Normal range of motion.  Neurological: Alert, no facial droop, fluent speech, moves all extremities symmetrically Skin: Skin is warm and dry.  Psychiatric: Cooperative   ED Treatments / Results  Labs (all labs ordered are listed, but only abnormal results are displayed) Labs Reviewed  BASIC METABOLIC PANEL - Abnormal; Notable for the following:       Result Value   Glucose, Bld 125 (*)    BUN 22 (*)    Creatinine, Ser 1.09 (*)    GFR calc non Af Amer 47 (*)    GFR calc Af Amer 55 (*)  All other components within normal limits  CBC - Abnormal; Notable for the following:    RDW 16.9 (*)    All other components within normal limits  URINALYSIS, ROUTINE W REFLEX MICROSCOPIC (NOT AT [facility]) - Abnormal; Notable for the following:    Ketones, ur 15 (*)    All other components within normal limits  TSH - Abnormal; Notable for the following:    TSH 12.593 (*)    All other components within normal limits  HEPATIC FUNCTION PANEL - Abnormal; Notable for the following:    ALT <5 (*)    Bilirubin, Direct <0.1 (*)    All other components within normal limits  URINE CULTURE  T4, FREE    EKG  EKG Interpretation None       Radiology Dg Chest 2 View  Result Date: 04/20/2016 CLINICAL DATA:  Initial evaluation for acute weakness, chest pain, dry cough, occasional shortness of breath. EXAM: CHEST  2 VIEW COMPARISON:  None. FINDINGS: Bilateral generator devices overlie both the right and left chest. Electrodes course cephalad into the neck. Mild cardiomegaly. Mediastinal  silhouette within normal limits. Aortic atherosclerosis noted. Lungs are normally inflated. No focal infiltrate, pulmonary edema, or pleural effusion. No pneumothorax. ACDF noted within the cervical spine.  No acute osseous abnormality. IMPRESSION: 1. No active cardiopulmonary disease. 2. Mild cardiomegaly without pulmonary edema. 3. Aortic atherosclerosis. Electronically Signed   By: Jeannine Boga M.D.   On: 04/20/2016 19:30    Procedures Procedures (including critical care time)  Medications Ordered in ED Medications  acetaminophen (TYLENOL) tablet 650 mg (not administered)  amantadine (SYMMETREL) capsule 100 mg (100 mg Oral Given 04/21/16 0013)  carbidopa-levodopa (SINEMET IR) 25-100 MG per tablet immediate release 1.5 tablet (1.5 tablets Oral Given 04/21/16 0013)  escitalopram (LEXAPRO) tablet 5 mg (5 mg Oral Not Given 04/21/16 0014)  levothyroxine (SYNTHROID, LEVOTHROID) tablet 100 mcg (not administered)  mirtazapine (REMERON) tablet 15 mg (15 mg Oral Not Given 04/21/16 0014)  pantoprazole (PROTONIX) EC tablet 40 mg (not administered)  pramipexole (MIRAPEX) tablet 0.5 mg (0.5 mg Oral Given 04/21/16 0011)  traZODone (DESYREL) tablet 25-50 mg (not administered)  irbesartan (AVAPRO) tablet 300 mg (300 mg Oral Given 04/21/16 0012)  sodium chloride 0.9 % bolus 1,000 mL (0 mLs Intravenous Stopped 04/20/16 2111)     Initial Impression / Assessment and Plan / ED Course  I have reviewed the triage vital signs and the nursing notes.  Pertinent labs & imaging results that were available during my care of the patient were reviewed by me and considered in my medical decision making (see chart for details).  Clinical Course   81 year old female with history Parkinson's disease and hypothyroidism who presents with 1 year progressive fatigue, weakness, and frequent falls. On presentation she is nontoxic and in no acute distress. Vital signs are non-concerning. She is grossly neurologically intact. Exam  otherwise unremarkable. Blood work without electrolyte derangements. Suspect her worsening hypothyroidism may play a role. No evidence of infection. Mild AKI and received IVF. Not meeting inpatient criteria. Discussed with patient who expressed that she feels she can no long live by herself. Social work did come to see patient, and she would like to be placed in nursing facility. Social work to arrangement placement for patient through ED. Patient to be observed in ED until placement.  Final Clinical Impressions(s) / ED Diagnoses   Final diagnoses:  Weakness  Other specified hypothyroidism    New Prescriptions New Prescriptions   No medications on file  Forde Dandy, MD 04/21/16 [phone]

## 2016-04-20 NOTE — NC FL2 (Signed)
[place] LEVEL OF CARE SCREENING TOOL     IDENTIFICATION  Patient Name: Shannon Vincent Birthdate: 1934-10-13 Sex: female Admission Date (Current Location): 04/20/2016  [facility] and [place] Number:  Herbalist and Address:  [facility]. [facility], [place] [address], [place], [place] [address]      Provider Number: [id number]  Attending Physician Name and Address:  Forde Dandy, MD  Relative Name and Phone Number:       Current Level of Care: Hospital Recommended Level of Care: [place] Prior Approval Number:    Date Approved/Denied:   PASRR Number:  ([id number])  Discharge Plan: SNF    Current Diagnoses: Patient Active Problem List   Diagnosis Date Noted  . Traumatic hematoma of forehead 03/31/2016  . Breast cancer screening 01/03/2016  . UTI (urinary tract infection) 10/29/2015  . Vitamin D deficiency 07/29/2015  . Absolute anemia 07/29/2015  . Thyroid activity decreased 07/29/2015  . Seasonal allergies 07/29/2015  . Postmenopausal 07/29/2015  . S/P surgical manipulation of ankle joint 07/29/2015  . OBESITY, UNSPECIFIED 12/02/2010  . FREQUENCY, URINARY 07/01/2010  . ESSENTIAL HYPERTENSION, BENIGN 04/01/2010  . HYPERLIPIDEMIA 02/22/2010  . ANXIETY STATE, UNSPECIFIED 02/22/2010  . PARKINSON'S DISEASE 02/22/2010  . OSTEOARTHRITIS, MODERATE 02/22/2010    Orientation RESPIRATION BLADDER Height & Weight     Self, Time, Situation, Place  Normal Continent Weight:   Height:     BEHAVIORAL SYMPTOMS/MOOD NEUROLOGICAL BOWEL NUTRITION STATUS      Continent Diet (heart healthy)  AMBULATORY STATUS COMMUNICATION OF NEEDS Skin   Limited Assist Verbally Normal                       Personal Care Assistance Level of Assistance  Bathing, Feeding, Dressing Bathing Assistance: Limited assistance Feeding assistance: Independent Dressing Assistance: Limited assistance     Functional Limitations Therapist, nutritional, Speech Sight  Info: Adequate Hearing Info: Adequate Speech Info: Adequate    SPECIAL CARE FACTORS FREQUENCY  PT (By licensed PT), OT (By licensed OT)     PT Frequency:  (5x/week) OT Frequency:  (5x/week)            Contractures Contractures Info: Not present    Additional Factors Info  Allergies   Allergies Info:  (codeine,morphine, latex)           Current Medications (04/20/2016):  This is the current hospital active medication list Current Facility-Administered Medications  Medication Dose Route Frequency Provider Last Rate Last Dose  . acetaminophen (TYLENOL) tablet 650 mg  650 mg Oral Q6H PRN Forde Dandy, MD      . amantadine (SYMMETREL) capsule 100 mg  100 mg Oral TID Forde Dandy, MD      . carbidopa-levodopa (SINEMET IR) 25-100 MG per tablet immediate release 1.5 tablet  1.5 tablet Oral Q4H Forde Dandy, MD      . escitalopram (LEXAPRO) tablet 5 mg  5 mg Oral Daily Forde Dandy, MD      . irbesartan (AVAPRO) tablet 300 mg  300 mg Oral Daily Forde Dandy, MD      . Derrill Memo ON 04/21/2016] levothyroxine (SYNTHROID, LEVOTHROID) tablet 100 mcg  100 mcg Oral QAC breakfast Forde Dandy, MD      . mirtazapine (REMERON) tablet 15 mg  15 mg Oral QHS Forde Dandy, MD      . Derrill Memo ON 04/21/2016] pantoprazole (PROTONIX) EC tablet 40 mg  40 mg Oral  Daily Forde Dandy, MD      . pramipexole (MIRAPEX) tablet 0.5 mg  0.5 mg Oral 5 X Daily Forde Dandy, MD      . traZODone (DESYREL) tablet 25-50 mg  25-50 mg Oral QHS PRN Forde Dandy, MD       Current Outpatient Prescriptions  Medication Sig Dispense Refill  . acetaminophen (TYLENOL) 325 MG tablet Take 2 tablets (650 mg total) by mouth every 6 (six) hours as needed for mild pain, moderate pain, fever or headache. 90 tablet 1  . amantadine (SYMMETREL) 100 MG capsule TAKE ONE CAPSULE BY MOUTH 3 TIMES A DAY (Patient taking differently: PER PATIENT:TAKE TWO  CAPSULES  BY MOUTH EVERY 3 HOURS) 90 capsule 0  . amantadine (SYMMETREL) 100 MG capsule TAKE ONE  CAPSULE BY MOUTH 3 TIMES A DAY 90 capsule 0  . AMBULATORY NON FORMULARY MEDICATION Shower Chair - Diagnosis - Parkinson's disease ICD-10 G20 1 each 0  . amoxicillin-clavulanate (AUGMENTIN) 875-125 MG tablet Take 1 tablet by mouth 2 (two) times daily. 14 tablet 0  . Calcium Carbonate-Vitamin D 600-400 MG-UNIT chew tablet Chew 2 tablets by mouth daily. 60 tablet 12  . carbidopa-levodopa (SINEMET IR) 25-100 MG tablet TAKE 1 & 1/2 TABLETS BY MOUTH SIX TIMES DAILY 250 tablet 0  . cetirizine (ZYRTEC) 5 MG tablet Take 1 tablet (5 mg total) by mouth daily as needed for allergies or rhinitis. 30 tablet 12  . escitalopram (LEXAPRO) 5 MG tablet Take 1 tablet (5 mg total) by mouth daily.    . folic acid (FOLVITE) 1 MG tablet TAKE ONE TABLET BY MOUTH EVERY DAY 30 tablet 0  . levothyroxine (SYNTHROID, LEVOTHROID) 100 MCG tablet Take 1 tablet (100 mcg total) by mouth daily before breakfast. 30 tablet 0  . methotrexate (RHEUMATREX) 2.5 MG tablet TAKE 6 TABLETS BY MOUTH ONCE A WEEK 24 tablet 0  . mirtazapine (REMERON) 15 MG tablet TAKE ONE TABLET BY MOUTH AT BEDTIME 30 tablet 0  . pantoprazole (PROTONIX) 40 MG tablet TAKE ONE TABLET BY MOUTH EVERY DAY 30 tablet 0  . potassium chloride SA (K-DUR,KLOR-CON) 20 MEQ tablet TAKE 1 TABLET BY MOUTH DAILY 30 tablet 0  . pramipexole (MIRAPEX) 0.5 MG tablet Take 1 tablet (0.5 mg total) by mouth 5 (five) times daily. APPOINTMENT NEEDED FOR FUTURE REFILLS 90 tablet 0  . SENEXON-S 8.6-50 MG tablet TAKE 2 TABLETS BY MOUTH AT BEDTIME AS NEEDED FOR MILD CONSTIPATION 60 tablet 0  . traZODone (DESYREL) 50 MG tablet Take 0.5-1 tablets (25-50 mg total) by mouth at bedtime as needed for sleep. 30 tablet 1  . valsartan (DIOVAN) 320 MG tablet Take 1 tablet (320 mg total) by mouth daily. 30 tablet 3  . vitamin B-12 (CYANOCOBALAMIN) 1000 MCG tablet Take one by mouth every day 30 tablet 0     Discharge Medications: Please see discharge summary for a list of discharge  medications.  Relevant Imaging Results:  Relevant Lab Results:   Additional Information    Doyl Bitting M, LCSW

## 2016-04-20 NOTE — Telephone Encounter (Signed)
Claiborne Billings, Lorey's daughter, called and states patient is unable to move. I advised if she truly can not move then they need to call EMS and have her transported to ED for evaluation.

## 2016-04-20 NOTE — ED Triage Notes (Signed)
Pt and family member reports ongoing fatigue and weakness, unable get up and move around as she normally does. [facility] and they ran [facility] tests, they thought maybe related to thyroid and changed her dosage but no relief of symptoms.

## 2016-04-20 NOTE — ED Notes (Signed)
Social Work, Tourist information centre manager at the bedside.

## 2016-04-20 NOTE — ED Notes (Signed)
EDP at the bedside.  ?

## 2016-04-20 NOTE — Telephone Encounter (Signed)
Patient daughter called stated that her mother has been unable to move and she believes it is her Parkinsons. She stated that she was seen in [place] and she would like to know what she should do. Please advise patient daughter. I left her a message advising that she could take her to the ER or she could schedule something her at the office. Rhonda Cunningham,CMA

## 2016-04-20 NOTE — Care Management (Signed)
ED CM met with patient and daughter Genene Churn [phone]. Regarding disposition. Patient presented to [facility] ED with recurrent falls, patient apparently lives alone. Patient is requesting placement, explained to patient and daughter patient does not met criteria for placement. Patient has not had a 3 night qualifiying stay according to Medicare guidelines. Discussed [facility] services, patient reports just completing [facility] services. Options discussed home with private duty, patient and daughter declined, CM consulted with CSW regarding SNF placement self-pay.

## 2016-04-20 NOTE — Telephone Encounter (Signed)
Agreed.  Thanks.  

## 2016-04-21 DIAGNOSIS — E038 Other specified hypothyroidism: Secondary | ICD-10-CM | POA: Diagnosis not present

## 2016-04-21 LAB — URINE CULTURE: CULTURE: NO GROWTH

## 2016-04-21 NOTE — ED Notes (Signed)
Message sent to pharmacy for Synthroid.

## 2016-04-21 NOTE — ED Notes (Signed)
Ordered patient Lunch.

## 2016-04-21 NOTE — Evaluation (Signed)
Physical Therapy Evaluation Patient Details Name: Shannon Vincent MRN: [id number] DOB: April 24, 1935 Today's Date: 04/21/2016   History of Present Illness  81 year old female who presents with generalized weakness. History of Parkinson's disease with DBS and hypothyroidism. Reports 1 year of progressive decline, weakness, fatigue, and difficulty with ambulation. She has been evaluated for this at [facility] 7/31 in setting of recent falls at home. She lives at home alone  Clinical Impression  Patient presents with recent falls at home and fluctuating levels of mobility.  States she has already worked out to have some hired help for groceries, Medical sales representative and transportation.  She presents with generalized weakness and decreased coordination and impaired tone related to her Parkinson's Disease.  She seems concerned about her DBS function, but does have follow up scheduled later this month with her neurologist.  Feel continued HHPT, HHaide, use of BSC at night, having her life alert system activated at home as well as increasing hired assistance initially will allow continued ability to live at home as she is unable currently to pay for inpatient rehab stay (per SW who has spoken with the daughter.)      Follow Up Recommendations Home health PT;Supervision for mobility/OOB    Equipment Recommendations  None recommended by PT    Recommendations for Other Services       Precautions / Restrictions Precautions Precautions: Fall Precaution Comments: fell about a month ago with hematoma on forehead, then smaller falls in the home without injury      Mobility  Bed Mobility Overal bed mobility: Needs Assistance Bed Mobility: Supine to Sit;Sit to Supine     Supine to sit: Supervision Sit to supine: Min guard   General bed mobility comments: guided legs into bed, reports she enters high bed at home on her knees then turns to sit, did have three falls from bed in a week, but none since  Transfers Overall  transfer level: Modified independent Equipment used: Rolling walker (2 wheeled) Transfers: Sit to/from Stand Sit to Stand: Modified independent (Device/Increase time)         General transfer comment: stood from tall bed with walker in front unaided  Ambulation/Gait Ambulation/Gait assistance: Supervision;Min guard Ambulation Distance (Feet): 100 Feet Assistive device: Rolling walker (2 wheeled) Gait Pattern/deviations: Festinating;Shuffle;Decreased stride length;Narrow base of support     General Gait Details: shuffling feet and intermittently festinating, but able to break out on her own; some assist to turn walker as she has rollator at home (used two wheeled walker for eval)  Stairs            Wheelchair Mobility    Modified Rankin (Stroke Patients Only)       Balance Overall balance assessment: Needs assistance   Sitting balance-Leahy Scale: Good     Standing balance support: Bilateral upper extremity supported Standing balance-Leahy Scale: Poor Standing balance comment: UE support in standing                             Pertinent Vitals/Pain Pain Assessment: No/denies pain    Home Living Family/patient expects to be discharged to:: Assisted living               Home Equipment: Walker - 4 wheels;Cane - single point;Bedside commode;Grab bars - toilet;Grab bars - tub/shower;Tub bench Additional Comments: states son in law bought her a life alert button, but has not installed it yet in her apartment.    Prior Function Level  of Independence: Independent with assistive device(s)         Comments: was driving until a couple of months ago.  Recently started paying a girl to help with laundry, groceries and some transportation, has daughter who helps limited with groceries and transportation to appointments, relates was in [place] a week ago and had stimulator batteries checked and were okay, (but states was told when initially had stimulator  placed they would only last 2-3 years and it has been 10).     Hand Dominance   Dominant Hand: Right    Extremity/Trunk Assessment   Upper Extremity Assessment: LUE deficits/detail       LUE Deficits / Details: positive for rheumatoid joint changes in L hand   Lower Extremity Assessment: Generalized weakness         Communication   Communication: No difficulties  Cognition Arousal/Alertness: Awake/alert Behavior During Therapy: WFL for tasks assessed/performed Overall Cognitive Status: No family/caregiver present to determine baseline cognitive functioning (admits to [facility] problems)                      General Comments General comments (skin integrity, edema, etc.): Patient seems torn between continuing with her neurologist at [facility] versus starting with new one closer to her home who may be more accessible.  STates has only seen his assistant for several visits.  Has appointment on 17th.  Encouraged her to keep appointment and to discuss with her MD and daughter    Exercises        Assessment/Plan    PT Assessment All further PT needs can be met in the next venue of care  PT Diagnosis Abnormality of gait;Generalized weakness   PT Problem List Decreased balance;Decreased mobility;Decreased coordination;Impaired tone  PT Treatment Interventions     PT Goals (Current goals can be found in the Care Plan section) Acute Rehab PT Goals Patient Stated Goal: To get more help in the home PT Goal Formulation: With patient    Frequency     Barriers to discharge        Co-evaluation               End of Session Equipment Utilized During Treatment: Gait belt Activity Tolerance: Patient tolerated treatment well Patient left: in bed;with call bell/phone within reach      Functional Assessment Tool Used: Clinical Judgement Functional Limitation: Mobility: Walking and moving around Mobility: Walking and Moving Around Current Status (E0814): At least 40  percent but less than 60 percent impaired, limited or restricted Mobility: Walking and Moving Around Goal Status [phone]): At least 40 percent but less than 60 percent impaired, limited or restricted Mobility: Walking and Moving Around Discharge Status [phone]): At least 40 percent but less than 60 percent impaired, limited or restricted    Time: 7026-3785 PT Time Calculation (min) (ACUTE ONLY): 42 min   Charges:   PT Evaluation $PT Eval Moderate Complexity: 1 Procedure PT Treatments $Gait Training: 8-22 mins   PT G Codes:   PT G-Codes **NOT FOR INPATIENT CLASS** Functional Assessment Tool Used: Clinical Judgement Functional Limitation: Mobility: Walking and moving around Mobility: Walking and Moving Around Current Status (Y8502): At least 40 percent but less than 60 percent impaired, limited or restricted Mobility: Walking and Moving Around Goal Status [phone]): At least 40 percent but less than 60 percent impaired, limited or restricted Mobility: Walking and Moving Around Discharge Status [phone]): At least 40 percent but less than 60 percent impaired, limited or restricted  Reginia Naas 04/21/2016, 11:39 AM  Magda Kiel, PT [phone] 04/21/2016

## 2016-04-21 NOTE — Progress Notes (Signed)
CSW staffed with Physical Therapist. Per PT, recommendations are continued home health with addition of home health aide. Patient already has all equipment needed. PT also recommending mobile meals. CSW to contact Patient's daughter with updates and to determine disposition. CSW will also staff with RN Case Manager for assistance.      Emiliano Dyer, LCSW [facility] ED/27M Clinical Social Worker [phone]

## 2016-04-21 NOTE — ED Notes (Signed)
Pharmacy sent medication Carbidopa-Levodopa

## 2016-04-21 NOTE — Discharge Planning (Signed)
EDCM consulted to add HHNA to pt [facility] services rendered through [facility]).  EDCM left vm for  Christa See, RN ([place]) to have NA added.

## 2016-04-21 NOTE — Progress Notes (Signed)
CSW received handoff from 2nd shift ED CSW re: SNF placement. Patient will be private pay as she has Medicare and does not have a 3 day inpatient qualifying stay. CSW awaiting PT/OT evaluations for further determination of level of care. 2nd shift ED CSW faxed Patient out to facilities on yesterday evening. As of 0845 today, no bed offers as of yet. CSW will continue to follow for disposition.          Emiliano Dyer, LCSW [facility] ED/80M Clinical Social Worker [phone]

## 2016-04-21 NOTE — ED Notes (Signed)
Patient assisted to bedside command,

## 2016-04-21 NOTE — ED Notes (Signed)
Patient given phone to call daughter. °

## 2016-04-21 NOTE — ED Notes (Signed)
Placed pt onto bedside toilet, pt tolerated well.

## 2016-04-21 NOTE — ED Notes (Signed)
Placed pt onto bedpan.

## 2016-04-21 NOTE — ED Notes (Signed)
Patient given breakfast. 

## 2016-04-21 NOTE — ED Notes (Signed)
Social work at bedside.  

## 2016-04-21 NOTE — Progress Notes (Signed)
CSW has talked to Patient and Patient's daughter. At this time, patient is unable to privately pay for a skilled nursing facility. Patient's family would like to bring Patient home with private duty care agency who will provide additional hours along with Patient's home health agency already in place. Patient and family appreciative of CSW's attempts to locate skilled nursing facilities for Patient. CSW will continue to follow for disposition.      Emiliano Dyer, LCSW [facility] ED/61M Clinical Social Worker [phone]

## 2016-04-21 NOTE — ED Notes (Addendum)
Pt placed on bedside toilet. Pt tolerated well.

## 2016-04-21 NOTE — Telephone Encounter (Signed)
Call patient's daughter and relayed this message to her, attempted to get in touch with her a few days in a row now, if she calls the office back she can speak to [place] or Reubin Milan and I'll try to get hold of her when I'm back in the office on Monday

## 2016-04-21 NOTE — ED Notes (Signed)
Patient ate lunch and resting calmly.

## 2016-04-21 NOTE — ED Notes (Signed)
PT at bedside.

## 2016-04-21 NOTE — Progress Notes (Addendum)
CSW received T/C from [facility] SNF re: private pay. CSW has attempted Patient's daughter x2 and left voice messages requesting return phone call as soon as possible. CSW has left voice messages with [facility] and [place] [facility] re: referrals. CSW to discuss with Patient.    1039A: CSW received return phone call from Patient's daughter. Patient's daughter would like to look at [facility] as she is does not believe her mother can pay for the facilities at the price SNF provided. CSW emailed Patient's daughter list of private duty care agencies. CSW informed Patient's daughter that the list is solely for the convenience of the patient/family.  [facility] is not recommending any agency, has made no effort to review their qualifications, and the patient/family is responsible for making their own independent determination of the qualification of any agency.  Any decision to engage an agency must be made between the patient/family and agency directly and does not involve [facility] in any way.  Patient's daughter would like for CSW to find out the cost of placement at the other two facilities Patient was referred to. Patient's daughter is going to contact private duty care agencies to see if that option is more feasible for her mother and family. CSW continues to follow for disposition.    Emiliano Dyer, LCSW [facility] ED/50M Clinical Social Worker [phone]

## 2016-04-23 DIAGNOSIS — R300 Dysuria: Secondary | ICD-10-CM | POA: Diagnosis not present

## 2016-04-23 DIAGNOSIS — R8290 Unspecified abnormal findings in urine: Secondary | ICD-10-CM | POA: Diagnosis not present

## 2016-04-23 DIAGNOSIS — R531 Weakness: Secondary | ICD-10-CM | POA: Diagnosis not present

## 2016-04-23 DIAGNOSIS — Z79899 Other long term (current) drug therapy: Secondary | ICD-10-CM | POA: Diagnosis not present

## 2016-04-23 DIAGNOSIS — Z885 Allergy status to narcotic agent status: Secondary | ICD-10-CM | POA: Diagnosis not present

## 2016-04-23 DIAGNOSIS — N289 Disorder of kidney and ureter, unspecified: Secondary | ICD-10-CM | POA: Diagnosis not present

## 2016-04-23 DIAGNOSIS — M1991 Primary osteoarthritis, unspecified site: Secondary | ICD-10-CM | POA: Diagnosis not present

## 2016-04-23 DIAGNOSIS — G20 Parkinson's disease: Secondary | ICD-10-CM | POA: Diagnosis not present

## 2016-04-23 DIAGNOSIS — N399 Disorder of urinary system, unspecified: Secondary | ICD-10-CM | POA: Diagnosis not present

## 2016-04-23 DIAGNOSIS — M545 Low back pain: Secondary | ICD-10-CM | POA: Diagnosis not present

## 2016-04-23 DIAGNOSIS — M069 Rheumatoid arthritis, unspecified: Secondary | ICD-10-CM | POA: Diagnosis not present

## 2016-04-23 DIAGNOSIS — Z91040 Latex allergy status: Secondary | ICD-10-CM | POA: Diagnosis not present

## 2016-04-24 ENCOUNTER — Other Ambulatory Visit: Payer: Self-pay | Admitting: Osteopathic Medicine

## 2016-04-24 ENCOUNTER — Telehealth: Payer: Self-pay

## 2016-04-24 NOTE — Telephone Encounter (Signed)
Call patient's daughter, no answer, left was not to call back the clinic

## 2016-04-24 NOTE — Telephone Encounter (Signed)
Patient daughter called stated that her mother has been back to the ER over the weekend for her Parkinsons, she request a  Call back form PCP regarding guidance on the patient. Jonessa Triplett,CMA

## 2016-04-26 ENCOUNTER — Telehealth: Payer: Self-pay | Admitting: Osteopathic Medicine

## 2016-04-26 NOTE — Telephone Encounter (Signed)
Spoke to patient's daughter, Shannon Vincent, who is concerned about the clinic status and frequent falls. Shannon Vincent is frequently going to the emergency room for either falling/immobility or minor complaints such as cloudy urine. She seems to be convinced that dehydration or possible UTI or thyroid issues have been the cause of all of her problems, rather than progression of the parkinsonism. Shannon Vincent also states that her daughter-in-law, who has been helping with medication management, has noticed that Shannon Vincent is not taking medications appropriately, is missing doses or doubling up on certain medications. May also need to consider poor medication administration as possible cause for her worsening symptoms if we are not appropriately treating the parkinsonism. We'll need to have a frank discussion about skilled nursing care,Wil fil out FL2 form.

## 2016-04-29 ENCOUNTER — Other Ambulatory Visit: Payer: Self-pay | Admitting: Osteopathic Medicine

## 2016-05-02 ENCOUNTER — Encounter: Payer: Self-pay | Admitting: Osteopathic Medicine

## 2016-05-02 ENCOUNTER — Ambulatory Visit (INDEPENDENT_AMBULATORY_CARE_PROVIDER_SITE_OTHER): Payer: Medicare Other | Admitting: Osteopathic Medicine

## 2016-05-02 VITALS — BP 110/71 | HR 89 | Wt 164.0 lb

## 2016-05-02 DIAGNOSIS — E038 Other specified hypothyroidism: Secondary | ICD-10-CM | POA: Diagnosis not present

## 2016-05-02 DIAGNOSIS — G20 Parkinson's disease: Secondary | ICD-10-CM

## 2016-05-02 DIAGNOSIS — I10 Essential (primary) hypertension: Secondary | ICD-10-CM

## 2016-05-02 DIAGNOSIS — R262 Difficulty in walking, not elsewhere classified: Secondary | ICD-10-CM

## 2016-05-02 NOTE — Patient Instructions (Addendum)
For motorized chair -   #1 Call insurance and ask what local home health / medical supply companies will provide motorized wheelchair.    #2 Insurance should be able to refer you to a local company - contact this company and they should be able to come out to evaluate your home and take some measurements.  #3 Medical supply company should come with you to a visit with me to go over all the paperwork.     Due for thyroid recheck 06/04/2016

## 2016-05-03 DIAGNOSIS — R262 Difficulty in walking, not elsewhere classified: Secondary | ICD-10-CM | POA: Insufficient documentation

## 2016-05-03 NOTE — Progress Notes (Signed)
HPI: Shannon Vincent is a 81 y.o. female  who presents to [facility] today, 05/03/16,  for chief complaint of:  Chief Complaint  Patient presents with  . Follow-up    assistive mobility devices, discussion of living situation    We have been discussing ways to reduce fall risk and increase level of care either in the home or with transfer to assisted living. Pt has waxing/waning severity of Parkinsons symptoms, multiple falls at home with one significant head injury. She has stopped driving. She has a granddaughter helping organize her medications but she is still on a fairly complicated regimen, hasn't been taking the Levothyroxine in the morning, recent abnormal thyroid. Daughter has been intough with an elder law attorney, Bertram Savin, who recommended some forms for me +/- neurology to fill out to get her qualified for some assistance. Patient would like to stay in the home, but per daughter, she is probably not 100% realistic about the severity of her Parkinson's and what this means for her safety and independence, she is looking for other problems to blame for falls such as dehydration or thyroid issues, and medication administration is a concern.    Patient is accompanied by daughter, Mateo Flow, who assists with history-taking.   Past medical, surgical, social and family history reviewed: Past Medical History:  Diagnosis Date  . Arthritis    asteotosis  . Cancer ([place])   . Dermatitis    asteotosis  . Osteoarthritis   . Parkinson disease ([place])    [facility] neuro   Past Surgical History:  Procedure Laterality Date  . ANKLE FRACTURE SURGERY  12/2014  . BRAIN SURGERY  09-10, 04-11   deep brain stimulator  . SPINE SURGERY  2004   cervical spine fusion    Social History  Substance Use Topics  . Smoking status: Never Smoker  . Smokeless tobacco: Not on file  . Alcohol use No   No family history on file.   Current medication list and  allergy/intolerance information reviewed:   Current Outpatient Prescriptions  Medication Sig Dispense Refill  . acetaminophen (TYLENOL) 325 MG tablet Take 2 tablets (650 mg total) by mouth every 6 (six) hours as needed for mild pain, moderate pain, fever or headache. 90 tablet 1  . amantadine (SYMMETREL) 100 MG capsule Take 100 mg by mouth 3 (three) times daily.     . AMBULATORY NON FORMULARY MEDICATION Shower Chair - Diagnosis - Parkinson's disease ICD-10 G20 1 each 0  . aspirin EC 81 MG tablet Take 81 mg by mouth daily at 12 noon.    . Calcium Carbonate-Vitamin D 600-400 MG-UNIT chew tablet Chew 2 tablets by mouth daily. (Patient taking differently: Chew 1 tablet by mouth daily. ) 60 tablet 12  . carbidopa-levodopa (SINEMET IR) 25-100 MG tablet Take 2 tablets by mouth every 3 (three) hours.     . cetirizine (ZYRTEC) 5 MG tablet Take 1 tablet (5 mg total) by mouth daily as needed for allergies or rhinitis. 30 tablet 12  . escitalopram (LEXAPRO) 5 MG tablet Take 1 tablet (5 mg total) by mouth daily.    . folic acid (FOLVITE) 1 MG tablet TAKE ONE TABLET BY MOUTH EVERY DAY 30 tablet 0  . levothyroxine (SYNTHROID, LEVOTHROID) 100 MCG tablet Take 1 tablet (100 mcg total) by mouth daily before breakfast. 30 tablet 0  . methotrexate (RHEUMATREX) 2.5 MG tablet TAKE 6 TABLETS BY MOUTH ONCE A WEEK 24 tablet 0  . mirtazapine (REMERON) 15  MG tablet Take 15 mg by mouth at bedtime.     . pantoprazole (PROTONIX) 40 MG tablet TAKE ONE TABLET BY MOUTH EVERY DAY 30 tablet 0  . potassium chloride SA (K-DUR,KLOR-CON) 20 MEQ tablet TAKE 1 TABLET BY MOUTH DAILY (Patient taking differently: TAKE 1 TABLET (20 mEq) BY MOUTH DAILY) 30 tablet 0  . pramipexole (MIRAPEX) 0.5 MG tablet TAKE ONE TABLET BY MOUTH 5 TIMES DAILY (6AM, 10AM, 2PM, 6PM AND 10PM) 150 tablet 0  . senna-docusate (SENOKOT-S) 8.6-50 MG tablet Take 1 tablet by mouth at bedtime.    . traZODone (DESYREL) 50 MG tablet Take 0.5-1 tablets (25-50 mg total) by  mouth at bedtime as needed for sleep. 30 tablet 1  . valsartan (DIOVAN) 320 MG tablet Take 1 tablet (320 mg total) by mouth daily. 30 tablet 3  . vitamin B-12 (CYANOCOBALAMIN) 1000 MCG tablet Take one by mouth every day (Patient taking differently: Take one tablet (1000 mcg) by mouth every day) 30 tablet 0   No current facility-administered medications for this visit.    Allergies  Allergen Reactions  . Codeine Nausea And Vomiting  . Morphine Diarrhea and Nausea And Vomiting  . Adhesive [Tape] Rash  . Latex Rash      Review of Systems:  Constitutional:  No  fever, no chills, No recent illness, No unintentional weight changes. No significant fatigue.   HEENT: No  headache, no vision change  Cardiac: No  chest pain,   Respiratory:  No  shortness of breath.  Neurologic: (+) weakness, No  dizziness, (+) tremor, No  slurred speech/focal weakness/facial droop   Exam:  BP 110/71   Pulse 89   Wt 164 lb (74.4 kg)   BMI 32.03 kg/m   Constitutional: VS see above. General Appearance: alert, well-developed, well-nourished, NAD  Neurological: No cranial nerve deficit on limited exam. Impaired gait/balance/coordination. (+) resting tremor.   Skin: warm, dry, intact. No rash/ulcer.   Psychiatric: Fair judgment/insight. Normal mood and affect. Oriented x3.      ASSESSMENT/PLAN:   Form filled out to take to lawyer, see scanned docs.   Advised I would recommend assisted living at least, but will see what resources are available to care for patient in the home.   Advised patient that having a motorized wheelchair at least available wlil help reduce fall risk. Will be difficult for her to continue to use a walker as well given RA/hand pain  Parkinson disease ([place])  Ambulatory dysfunction  Other specified hypothyroidism  Essential hypertension, benign   Patient Instructions  For motorized chair -   #1 Call insurance and ask what local home health / medical supply companies  will provide motorized wheelchair.    #2 Insurance should be able to refer you to a local company - contact this company and they should be able to come out to evaluate your home and take some measurements.  #3 Medical supply company should come with you to a visit with me to go over all the paperwork.     Due for thyroid recheck 06/04/2016    Visit summary with medication list and pertinent instructions was printed for patient to review. All questions at time of visit were answered - patient instructed to contact office with any additional concerns. ER/RTC precautions were reviewed with the patient. Follow-up plan: Return in about 3 months (around 08/02/2016), or sooner if needed / for [facility] after you've called your insurance, for ROUTINE CHECK-UP.  Note: Total time spent 25 minutes, greater  than 50% of the visit was spent face-to-face counseling patient and daughter and coordinating care for the following: The primary encounter diagnosis was Parkinson disease ([place]). Diagnoses of Ambulatory dysfunction, Other specified hypothyroidism, and Essential hypertension, benign were also pertinent to this visit.Marland Kitchen

## 2016-05-04 DIAGNOSIS — G20 Parkinson's disease: Secondary | ICD-10-CM | POA: Diagnosis not present

## 2016-05-04 NOTE — Progress Notes (Signed)
PT evaluation addendum Late entry for 04/21/16 PT diagnosis   04/21/16 1135  PT Assessment  PT Therapy Diagnosis  Abnormality of gait;Generalized weakness (repeated falls; muscle weakness, generalized)  PT Recommendation/Assessment All further PT needs can be met in the next venue of care  PT Problem List Decreased balance;Decreased mobility;Decreased coordination;Impaired tone   05/04/2016 [place], [place]

## 2016-05-10 ENCOUNTER — Other Ambulatory Visit: Payer: Self-pay | Admitting: Osteopathic Medicine

## 2016-05-11 ENCOUNTER — Other Ambulatory Visit: Payer: Self-pay | Admitting: Osteopathic Medicine

## 2016-05-11 NOTE — Telephone Encounter (Signed)
Im routing this med to you just because it looks like it may be a historical med meaning that we haven't prescribed it yet from our office. I only say that because there is no quantity dispensed amount or refills listed on it even though it looks like it was sent. (normally it will say historical med and  it won't let you send it  if these fields are not filled in so Im not sure) any way didn't want to refill if this was indeed a historical med

## 2016-05-11 NOTE — Telephone Encounter (Signed)
I think this may be one that I added to her list but never Rx'ed? It's ok to refill this. 30 days x 3 refills.

## 2016-05-15 NOTE — Telephone Encounter (Signed)
Med was sent on 8/24

## 2016-05-25 DIAGNOSIS — G20 Parkinson's disease: Secondary | ICD-10-CM | POA: Diagnosis not present

## 2016-05-25 DIAGNOSIS — F329 Major depressive disorder, single episode, unspecified: Secondary | ICD-10-CM | POA: Diagnosis not present

## 2016-05-25 DIAGNOSIS — I10 Essential (primary) hypertension: Secondary | ICD-10-CM | POA: Diagnosis not present

## 2016-05-25 DIAGNOSIS — M069 Rheumatoid arthritis, unspecified: Secondary | ICD-10-CM | POA: Diagnosis not present

## 2016-05-25 DIAGNOSIS — M1712 Unilateral primary osteoarthritis, left knee: Secondary | ICD-10-CM | POA: Diagnosis not present

## 2016-05-25 DIAGNOSIS — F458 Other somatoform disorders: Secondary | ICD-10-CM | POA: Diagnosis not present

## 2016-05-30 DIAGNOSIS — M069 Rheumatoid arthritis, unspecified: Secondary | ICD-10-CM | POA: Diagnosis not present

## 2016-05-30 DIAGNOSIS — G20 Parkinson's disease: Secondary | ICD-10-CM | POA: Diagnosis not present

## 2016-05-30 DIAGNOSIS — I10 Essential (primary) hypertension: Secondary | ICD-10-CM | POA: Diagnosis not present

## 2016-05-30 DIAGNOSIS — F329 Major depressive disorder, single episode, unspecified: Secondary | ICD-10-CM | POA: Diagnosis not present

## 2016-05-30 DIAGNOSIS — F458 Other somatoform disorders: Secondary | ICD-10-CM | POA: Diagnosis not present

## 2016-05-30 DIAGNOSIS — M1712 Unilateral primary osteoarthritis, left knee: Secondary | ICD-10-CM | POA: Diagnosis not present

## 2016-05-31 DIAGNOSIS — G20 Parkinson's disease: Secondary | ICD-10-CM | POA: Diagnosis not present

## 2016-06-01 DIAGNOSIS — F329 Major depressive disorder, single episode, unspecified: Secondary | ICD-10-CM | POA: Diagnosis not present

## 2016-06-01 DIAGNOSIS — M1712 Unilateral primary osteoarthritis, left knee: Secondary | ICD-10-CM | POA: Diagnosis not present

## 2016-06-01 DIAGNOSIS — I10 Essential (primary) hypertension: Secondary | ICD-10-CM | POA: Diagnosis not present

## 2016-06-01 DIAGNOSIS — M069 Rheumatoid arthritis, unspecified: Secondary | ICD-10-CM | POA: Diagnosis not present

## 2016-06-01 DIAGNOSIS — G20 Parkinson's disease: Secondary | ICD-10-CM | POA: Diagnosis not present

## 2016-06-01 DIAGNOSIS — F458 Other somatoform disorders: Secondary | ICD-10-CM | POA: Diagnosis not present

## 2016-06-05 DIAGNOSIS — F458 Other somatoform disorders: Secondary | ICD-10-CM | POA: Diagnosis not present

## 2016-06-05 DIAGNOSIS — F329 Major depressive disorder, single episode, unspecified: Secondary | ICD-10-CM | POA: Diagnosis not present

## 2016-06-05 DIAGNOSIS — M069 Rheumatoid arthritis, unspecified: Secondary | ICD-10-CM | POA: Diagnosis not present

## 2016-06-05 DIAGNOSIS — M1712 Unilateral primary osteoarthritis, left knee: Secondary | ICD-10-CM | POA: Diagnosis not present

## 2016-06-05 DIAGNOSIS — G20 Parkinson's disease: Secondary | ICD-10-CM | POA: Diagnosis not present

## 2016-06-05 DIAGNOSIS — I10 Essential (primary) hypertension: Secondary | ICD-10-CM | POA: Diagnosis not present

## 2016-06-07 DIAGNOSIS — E785 Hyperlipidemia, unspecified: Secondary | ICD-10-CM | POA: Diagnosis not present

## 2016-06-07 DIAGNOSIS — Z79899 Other long term (current) drug therapy: Secondary | ICD-10-CM | POA: Diagnosis not present

## 2016-06-07 DIAGNOSIS — Z91048 Other nonmedicinal substance allergy status: Secondary | ICD-10-CM | POA: Diagnosis not present

## 2016-06-07 DIAGNOSIS — Z885 Allergy status to narcotic agent status: Secondary | ICD-10-CM | POA: Diagnosis not present

## 2016-06-07 DIAGNOSIS — Z462 Encounter for fitting and adjustment of other devices related to nervous system and special senses: Secondary | ICD-10-CM | POA: Diagnosis not present

## 2016-06-07 DIAGNOSIS — E039 Hypothyroidism, unspecified: Secondary | ICD-10-CM | POA: Diagnosis not present

## 2016-06-07 DIAGNOSIS — Z91040 Latex allergy status: Secondary | ICD-10-CM | POA: Diagnosis not present

## 2016-06-07 DIAGNOSIS — G20 Parkinson's disease: Secondary | ICD-10-CM | POA: Diagnosis not present

## 2016-06-07 DIAGNOSIS — M069 Rheumatoid arthritis, unspecified: Secondary | ICD-10-CM | POA: Diagnosis not present

## 2016-06-07 DIAGNOSIS — Z4542 Encounter for adjustment and management of neuropacemaker (brain) (peripheral nerve) (spinal cord): Secondary | ICD-10-CM | POA: Diagnosis not present

## 2016-06-07 DIAGNOSIS — F419 Anxiety disorder, unspecified: Secondary | ICD-10-CM | POA: Diagnosis not present

## 2016-06-07 DIAGNOSIS — F329 Major depressive disorder, single episode, unspecified: Secondary | ICD-10-CM | POA: Diagnosis not present

## 2016-06-07 DIAGNOSIS — K219 Gastro-esophageal reflux disease without esophagitis: Secondary | ICD-10-CM | POA: Diagnosis not present

## 2016-06-08 ENCOUNTER — Other Ambulatory Visit: Payer: Self-pay | Admitting: Osteopathic Medicine

## 2016-06-14 DIAGNOSIS — M069 Rheumatoid arthritis, unspecified: Secondary | ICD-10-CM | POA: Diagnosis not present

## 2016-06-14 DIAGNOSIS — F419 Anxiety disorder, unspecified: Secondary | ICD-10-CM | POA: Diagnosis not present

## 2016-06-14 DIAGNOSIS — M1990 Unspecified osteoarthritis, unspecified site: Secondary | ICD-10-CM | POA: Diagnosis not present

## 2016-06-14 DIAGNOSIS — Z48811 Encounter for surgical aftercare following surgery on the nervous system: Secondary | ICD-10-CM | POA: Diagnosis not present

## 2016-06-14 DIAGNOSIS — F329 Major depressive disorder, single episode, unspecified: Secondary | ICD-10-CM | POA: Diagnosis not present

## 2016-06-14 DIAGNOSIS — G20 Parkinson's disease: Secondary | ICD-10-CM | POA: Diagnosis not present

## 2016-06-15 ENCOUNTER — Telehealth: Payer: Self-pay | Admitting: Osteopathic Medicine

## 2016-06-15 NOTE — Telephone Encounter (Signed)
Shannon Vincent from [facility] care at home notified of verbal order. Provided correct read back. No further questions.

## 2016-06-15 NOTE — Telephone Encounter (Signed)
Shannon Vincent with [facility] care at home [phone]) called to get a verbal order for home PT upon discharge from [facility]. Twice a week for 6 weeks. Focus on balance and strength training. Will route to Provider for review.

## 2016-06-15 NOTE — Telephone Encounter (Signed)
More than happy to approve this order.

## 2016-06-20 DIAGNOSIS — G20 Parkinson's disease: Secondary | ICD-10-CM | POA: Diagnosis not present

## 2016-06-20 DIAGNOSIS — M069 Rheumatoid arthritis, unspecified: Secondary | ICD-10-CM | POA: Diagnosis not present

## 2016-06-20 DIAGNOSIS — Z48811 Encounter for surgical aftercare following surgery on the nervous system: Secondary | ICD-10-CM | POA: Diagnosis not present

## 2016-06-20 DIAGNOSIS — M1990 Unspecified osteoarthritis, unspecified site: Secondary | ICD-10-CM | POA: Diagnosis not present

## 2016-06-20 DIAGNOSIS — F329 Major depressive disorder, single episode, unspecified: Secondary | ICD-10-CM | POA: Diagnosis not present

## 2016-06-20 DIAGNOSIS — F419 Anxiety disorder, unspecified: Secondary | ICD-10-CM | POA: Diagnosis not present

## 2016-06-22 DIAGNOSIS — F329 Major depressive disorder, single episode, unspecified: Secondary | ICD-10-CM | POA: Diagnosis not present

## 2016-06-22 DIAGNOSIS — F419 Anxiety disorder, unspecified: Secondary | ICD-10-CM | POA: Diagnosis not present

## 2016-06-22 DIAGNOSIS — Z48811 Encounter for surgical aftercare following surgery on the nervous system: Secondary | ICD-10-CM | POA: Diagnosis not present

## 2016-06-22 DIAGNOSIS — G4700 Insomnia, unspecified: Secondary | ICD-10-CM | POA: Diagnosis not present

## 2016-06-22 DIAGNOSIS — E785 Hyperlipidemia, unspecified: Secondary | ICD-10-CM | POA: Diagnosis not present

## 2016-06-22 DIAGNOSIS — M1990 Unspecified osteoarthritis, unspecified site: Secondary | ICD-10-CM | POA: Diagnosis not present

## 2016-06-22 DIAGNOSIS — G20 Parkinson's disease: Secondary | ICD-10-CM | POA: Diagnosis not present

## 2016-06-22 DIAGNOSIS — E049 Nontoxic goiter, unspecified: Secondary | ICD-10-CM | POA: Diagnosis not present

## 2016-06-22 DIAGNOSIS — M069 Rheumatoid arthritis, unspecified: Secondary | ICD-10-CM | POA: Diagnosis not present

## 2016-06-22 DIAGNOSIS — E039 Hypothyroidism, unspecified: Secondary | ICD-10-CM | POA: Diagnosis not present

## 2016-06-27 DIAGNOSIS — F419 Anxiety disorder, unspecified: Secondary | ICD-10-CM | POA: Diagnosis not present

## 2016-06-27 DIAGNOSIS — F329 Major depressive disorder, single episode, unspecified: Secondary | ICD-10-CM | POA: Diagnosis not present

## 2016-06-27 DIAGNOSIS — G20 Parkinson's disease: Secondary | ICD-10-CM | POA: Diagnosis not present

## 2016-06-27 DIAGNOSIS — M1990 Unspecified osteoarthritis, unspecified site: Secondary | ICD-10-CM | POA: Diagnosis not present

## 2016-06-27 DIAGNOSIS — M069 Rheumatoid arthritis, unspecified: Secondary | ICD-10-CM | POA: Diagnosis not present

## 2016-06-27 DIAGNOSIS — Z48811 Encounter for surgical aftercare following surgery on the nervous system: Secondary | ICD-10-CM | POA: Diagnosis not present

## 2016-06-30 DIAGNOSIS — M1990 Unspecified osteoarthritis, unspecified site: Secondary | ICD-10-CM | POA: Diagnosis not present

## 2016-06-30 DIAGNOSIS — G20 Parkinson's disease: Secondary | ICD-10-CM | POA: Diagnosis not present

## 2016-06-30 DIAGNOSIS — F419 Anxiety disorder, unspecified: Secondary | ICD-10-CM | POA: Diagnosis not present

## 2016-06-30 DIAGNOSIS — Z48811 Encounter for surgical aftercare following surgery on the nervous system: Secondary | ICD-10-CM | POA: Diagnosis not present

## 2016-06-30 DIAGNOSIS — F329 Major depressive disorder, single episode, unspecified: Secondary | ICD-10-CM | POA: Diagnosis not present

## 2016-06-30 DIAGNOSIS — M069 Rheumatoid arthritis, unspecified: Secondary | ICD-10-CM | POA: Diagnosis not present

## 2016-07-04 DIAGNOSIS — F329 Major depressive disorder, single episode, unspecified: Secondary | ICD-10-CM | POA: Diagnosis not present

## 2016-07-04 DIAGNOSIS — G20 Parkinson's disease: Secondary | ICD-10-CM | POA: Diagnosis not present

## 2016-07-04 DIAGNOSIS — M1990 Unspecified osteoarthritis, unspecified site: Secondary | ICD-10-CM | POA: Diagnosis not present

## 2016-07-04 DIAGNOSIS — F419 Anxiety disorder, unspecified: Secondary | ICD-10-CM | POA: Diagnosis not present

## 2016-07-04 DIAGNOSIS — M069 Rheumatoid arthritis, unspecified: Secondary | ICD-10-CM | POA: Diagnosis not present

## 2016-07-04 DIAGNOSIS — Z48811 Encounter for surgical aftercare following surgery on the nervous system: Secondary | ICD-10-CM | POA: Diagnosis not present

## 2016-07-07 DIAGNOSIS — F329 Major depressive disorder, single episode, unspecified: Secondary | ICD-10-CM | POA: Diagnosis not present

## 2016-07-07 DIAGNOSIS — G20 Parkinson's disease: Secondary | ICD-10-CM | POA: Diagnosis not present

## 2016-07-07 DIAGNOSIS — F419 Anxiety disorder, unspecified: Secondary | ICD-10-CM | POA: Diagnosis not present

## 2016-07-07 DIAGNOSIS — M069 Rheumatoid arthritis, unspecified: Secondary | ICD-10-CM | POA: Diagnosis not present

## 2016-07-07 DIAGNOSIS — Z48811 Encounter for surgical aftercare following surgery on the nervous system: Secondary | ICD-10-CM | POA: Diagnosis not present

## 2016-07-07 DIAGNOSIS — M1990 Unspecified osteoarthritis, unspecified site: Secondary | ICD-10-CM | POA: Diagnosis not present

## 2016-07-11 DIAGNOSIS — M069 Rheumatoid arthritis, unspecified: Secondary | ICD-10-CM | POA: Diagnosis not present

## 2016-07-11 DIAGNOSIS — M1990 Unspecified osteoarthritis, unspecified site: Secondary | ICD-10-CM | POA: Diagnosis not present

## 2016-07-11 DIAGNOSIS — F329 Major depressive disorder, single episode, unspecified: Secondary | ICD-10-CM | POA: Diagnosis not present

## 2016-07-11 DIAGNOSIS — Z48811 Encounter for surgical aftercare following surgery on the nervous system: Secondary | ICD-10-CM | POA: Diagnosis not present

## 2016-07-11 DIAGNOSIS — F419 Anxiety disorder, unspecified: Secondary | ICD-10-CM | POA: Diagnosis not present

## 2016-07-11 DIAGNOSIS — G20 Parkinson's disease: Secondary | ICD-10-CM | POA: Diagnosis not present

## 2016-07-13 ENCOUNTER — Other Ambulatory Visit: Payer: Self-pay | Admitting: Osteopathic Medicine

## 2016-07-18 DIAGNOSIS — F419 Anxiety disorder, unspecified: Secondary | ICD-10-CM | POA: Diagnosis not present

## 2016-07-18 DIAGNOSIS — Z48811 Encounter for surgical aftercare following surgery on the nervous system: Secondary | ICD-10-CM | POA: Diagnosis not present

## 2016-07-18 DIAGNOSIS — F329 Major depressive disorder, single episode, unspecified: Secondary | ICD-10-CM | POA: Diagnosis not present

## 2016-07-18 DIAGNOSIS — M1990 Unspecified osteoarthritis, unspecified site: Secondary | ICD-10-CM | POA: Diagnosis not present

## 2016-07-18 DIAGNOSIS — M069 Rheumatoid arthritis, unspecified: Secondary | ICD-10-CM | POA: Diagnosis not present

## 2016-07-18 DIAGNOSIS — G20 Parkinson's disease: Secondary | ICD-10-CM | POA: Diagnosis not present

## 2016-07-20 DIAGNOSIS — F329 Major depressive disorder, single episode, unspecified: Secondary | ICD-10-CM | POA: Diagnosis not present

## 2016-07-20 DIAGNOSIS — F419 Anxiety disorder, unspecified: Secondary | ICD-10-CM | POA: Diagnosis not present

## 2016-07-20 DIAGNOSIS — Z48811 Encounter for surgical aftercare following surgery on the nervous system: Secondary | ICD-10-CM | POA: Diagnosis not present

## 2016-07-20 DIAGNOSIS — M1990 Unspecified osteoarthritis, unspecified site: Secondary | ICD-10-CM | POA: Diagnosis not present

## 2016-07-20 DIAGNOSIS — M069 Rheumatoid arthritis, unspecified: Secondary | ICD-10-CM | POA: Diagnosis not present

## 2016-07-20 DIAGNOSIS — G20 Parkinson's disease: Secondary | ICD-10-CM | POA: Diagnosis not present

## 2016-07-27 DIAGNOSIS — G20 Parkinson's disease: Secondary | ICD-10-CM | POA: Diagnosis not present

## 2016-07-27 DIAGNOSIS — Z48811 Encounter for surgical aftercare following surgery on the nervous system: Secondary | ICD-10-CM | POA: Diagnosis not present

## 2016-07-27 DIAGNOSIS — F329 Major depressive disorder, single episode, unspecified: Secondary | ICD-10-CM | POA: Diagnosis not present

## 2016-07-27 DIAGNOSIS — M1990 Unspecified osteoarthritis, unspecified site: Secondary | ICD-10-CM | POA: Diagnosis not present

## 2016-07-27 DIAGNOSIS — M069 Rheumatoid arthritis, unspecified: Secondary | ICD-10-CM | POA: Diagnosis not present

## 2016-07-27 DIAGNOSIS — F419 Anxiety disorder, unspecified: Secondary | ICD-10-CM | POA: Diagnosis not present

## 2016-08-08 ENCOUNTER — Other Ambulatory Visit: Payer: Self-pay | Admitting: Osteopathic Medicine

## 2016-08-08 ENCOUNTER — Other Ambulatory Visit: Payer: Self-pay | Admitting: Sports Medicine

## 2016-08-08 DIAGNOSIS — I10 Essential (primary) hypertension: Secondary | ICD-10-CM

## 2016-08-08 NOTE — Telephone Encounter (Signed)
Refill request to PCP

## 2016-08-24 ENCOUNTER — Ambulatory Visit: Payer: Medicare Other | Admitting: Osteopathic Medicine

## 2016-08-28 ENCOUNTER — Encounter: Payer: Self-pay | Admitting: Osteopathic Medicine

## 2016-08-28 ENCOUNTER — Ambulatory Visit (INDEPENDENT_AMBULATORY_CARE_PROVIDER_SITE_OTHER): Payer: Medicare Other | Admitting: Osteopathic Medicine

## 2016-08-28 VITALS — BP 128/92 | HR 89 | Ht 60.0 in | Wt 176.0 lb

## 2016-08-28 DIAGNOSIS — K225 Diverticulum of esophagus, acquired: Secondary | ICD-10-CM

## 2016-08-28 DIAGNOSIS — R058 Other specified cough: Secondary | ICD-10-CM

## 2016-08-28 DIAGNOSIS — E890 Postprocedural hypothyroidism: Secondary | ICD-10-CM | POA: Diagnosis not present

## 2016-08-28 DIAGNOSIS — R05 Cough: Secondary | ICD-10-CM

## 2016-08-28 DIAGNOSIS — R1310 Dysphagia, unspecified: Secondary | ICD-10-CM

## 2016-08-28 DIAGNOSIS — M069 Rheumatoid arthritis, unspecified: Secondary | ICD-10-CM

## 2016-08-28 MED ORDER — FLUTICASONE PROPIONATE 50 MCG/ACT NA SUSP
2.0000 | Freq: Every day | NASAL | 6 refills | Status: AC
Start: 2016-08-28 — End: ?

## 2016-08-28 NOTE — Progress Notes (Signed)
HPI: Shannon Vincent is a 81 y.o. female  who presents to [place] today, 08/28/16,  for chief complaint of:  Chief Complaint  Patient presents with  . Other    patient complains of throat concerns and not being able to swallow and having coughing spells    SORE THROAT . Context:Thyroid surgery was a few years ago, complaining of sore throat/some discomfort with swallowing since then. She states that she was previously diagnosed with Zenker's diverticulum. Not sure if any procedure done to correct this. Has been sometimes she has seen GI specialist. Is on PPI for GERD, complains of some postnasal drip/allergy symptoms, not medicated. . Location: back of throat, midway down neck . Quality: choking feeling . Duration: Several years . Assoc signs/symptoms: coughing spells   RHEUM: Chronic condition, worsening - inquires about altering rheumatoid arthritis medications, currently on methotrexate. Has not followed up with rheumatology in some time. Would like referral to somewhere in [place] if possible.  THYROID: Due for recheck on labs. Previously adjusted dose several months ago, no follow-up since then for labs.    Past medical, surgical, social and family history reviewed: Patient Active Problem List   Diagnosis Date Noted  . Ambulatory dysfunction 05/03/2016  . Traumatic hematoma of forehead 03/31/2016  . Breast cancer screening 01/03/2016  . UTI (urinary tract infection) 10/29/2015  . Vitamin D deficiency 07/29/2015  . Absolute anemia 07/29/2015  . Thyroid activity decreased 07/29/2015  . Seasonal allergies 07/29/2015  . Postmenopausal 07/29/2015  . S/P surgical manipulation of ankle joint 07/29/2015  . OBESITY, UNSPECIFIED 12/02/2010  . FREQUENCY, URINARY 07/01/2010  . ESSENTIAL HYPERTENSION, BENIGN 04/01/2010  . HYPERLIPIDEMIA 02/22/2010  . ANXIETY STATE, UNSPECIFIED 02/22/2010  . Parkinson disease ([place]) 02/22/2010  .  OSTEOARTHRITIS, MODERATE 02/22/2010   Past Surgical History:  Procedure Laterality Date  . ANKLE FRACTURE SURGERY  12/2014  . BRAIN SURGERY  09-10, 04-11   deep brain stimulator  . SPINE SURGERY  2004   cervical spine fusion    Social History  Substance Use Topics  . Smoking status: Never Smoker  . Smokeless tobacco: Not on file  . Alcohol use No   No family history on file.   Current medication list and allergy/intolerance information reviewed:   Current Outpatient Prescriptions on File Prior to Visit  Medication Sig Dispense Refill  . acetaminophen (TYLENOL) 325 MG tablet Take 2 tablets (650 mg total) by mouth every 6 (six) hours as needed for mild pain, moderate pain, fever or headache. 90 tablet 1  . amantadine (SYMMETREL) 100 MG capsule Take 100 mg by mouth 3 (three) times daily.     Marland Kitchen amantadine (SYMMETREL) 100 MG capsule TAKE ONE CAPSULE BY MOUTH 3 TIMES A DAY 90 capsule 0  . amantadine (SYMMETREL) 100 MG capsule TAKE ONE CAPSULE BY MOUTH 3 TIMES A DAY 90 capsule 0  . amantadine (SYMMETREL) 100 MG capsule TAKE ONE CAPSULE BY MOUTH 3 TIMES A DAY 90 capsule 0  . amantadine (SYMMETREL) 100 MG capsule TAKE ONE CAPSULE BY MOUTH 3 TIMES A DAY 90 capsule 0  . AMBULATORY NON FORMULARY MEDICATION Shower Chair - Diagnosis - Parkinson's disease ICD-10 G20 1 each 0  . aspirin 81 MG EC tablet TAKE ONE TABLET BY MOUTH EVERY DAY 30 tablet 0  . Calcium Carbonate-Vitamin D 600-400 MG-UNIT chew tablet Chew 2 tablets by mouth daily. (Patient taking differently: Chew 1 tablet by mouth daily. ) 60 tablet 12  . carbidopa-levodopa (SINEMET IR)  25-100 MG tablet TAKE TWO TABLETS BY MOUTH EVERY 3 HOURS. MAX OF 7 DOSES A DAY 420 tablet 0  . cetirizine (ZYRTEC) 5 MG tablet Take 1 tablet (5 mg total) by mouth daily as needed for allergies or rhinitis. 30 tablet 12  . escitalopram (LEXAPRO) 5 MG tablet TAKE ONE TABLET BY MOUTH EVERY DAY 30 tablet 0  . folic acid (FOLVITE) 1 MG tablet TAKE ONE TABLET BY  MOUTH EVERY DAY 30 tablet 0  . levothyroxine (SYNTHROID, LEVOTHROID) 100 MCG tablet TAKE ONE TABLET BY MOUTH EVERY DAY BEFORE BREAKFAST 30 tablet 0  . methotrexate (RHEUMATREX) 2.5 MG tablet TAKE 6 TABLETS BY MOUTH ONCE A WEEK 24 tablet 0  . mirtazapine (REMERON) 15 MG tablet TAKE ONE TABLET BY MOUTH AT BEDTIME 30 tablet 0  . pantoprazole (PROTONIX) 40 MG tablet TAKE ONE TABLET BY MOUTH EVERY DAY 30 tablet 0  . potassium chloride SA (K-DUR,KLOR-CON) 20 MEQ tablet Take 1 tablet (20 mEq total) by mouth daily. NEED FOLLOW UP VISIT FOR MORE REFILLS 30 tablet 0  . pramipexole (MIRAPEX) 0.5 MG tablet TAKE ONE TABLET BY MOUTH 5 TIMES DAILY (6AM, 10AM, 2PM, 6PM AND 10PM) 150 tablet 0  . SENEXON-S 8.6-50 MG tablet TAKE 2 TABLETS BY MOUTH AT BEDTIME AS NEEDED FOR MILD CONSTIPATION 60 tablet 0  . traZODone (DESYREL) 50 MG tablet Take 0.5-1 tablets (25-50 mg total) by mouth at bedtime as needed for sleep. 30 tablet 1  . valsartan (DIOVAN) 320 MG tablet TAKE ONE TABLET BY MOUTH EVERY DAY 90 tablet 0  . vitamin B-12 (CYANOCOBALAMIN) 1000 MCG tablet Take one by mouth every day 30 tablet 0   No current facility-administered medications on file prior to visit.    Allergies  Allergen Reactions  . Codeine Nausea And Vomiting  . Morphine Diarrhea and Nausea And Vomiting  . Adhesive [Tape] Rash  . Latex Rash      Review of Systems:  Constitutional: No recent illness  HEENT: No  headache, no vision change  Cardiac: No  chest pain  Respiratory:  No  shortness of breath. +Cough  Gastrointestinal: No  abdominal pain, no change on bowel habits  Musculoskeletal: +more severe RA myalgia/arthralgia  Skin: No  Rash  Hem/Onc: No  easy bruising/bleeding, No  abnormal lumps/bumps  Neurologic: No  weakness, No  Dizziness  Psychiatric: No  concerns with depression, No  concerns with anxiety  Exam:  BP (!) 128/92   Pulse 89   Ht 5' (1.524 m)   Wt 176 lb (79.8 kg)   BMI 34.37 kg/m   Constitutional:  VS see above. General Appearance: alert, well-developed, well-nourished, NAD  Ears, Nose, Mouth, Throat: MMM, Normal external inspection ears/nares/mouth/lips/gums.  Neck: No masses, trachea midline. No lymphadenopathy, no thyromegaly.  Respiratory: Normal respiratory effort. no wheeze, no rhonchi, no rales  Cardiovascular: S1/S2 normal, no murmur, no rub/gallop auscultated. RRR.   Musculoskeletal: Gait unsteady, uses assistance of walker. Ulnar deviation to fingers  Neurological: Normal balance/coordination. +tremor.  Skin: warm, dry, intact.   Psychiatric: Normal judgment/insight. Normal mood and affect. Oriented x3.    Results for orders placed or performed in visit on 08/28/16 (from the past 72 hour(s))  Thyroid Panel With TSH     Status: Abnormal   Collection Time: 08/28/16  4:33 PM  Result Value Ref Range   T4, Total 8.5 4.5 - 12.0 ug/dL   T3 Uptake 32 22 - 35 %   Free Thyroxine Index 2.7 1.4 - 3.8  TSH 5.18 (H) mIU/L    Comment:   Reference Range   > or = 20 Years  0.40-4.50   Pregnancy Range First trimester  0.26-2.66 Second trimester 0.55-2.73 Third trimester  0.43-2.91       No results found.   ASSESSMENT/PLAN:   Plan for referral to GI, may need to consider EGD. Also already treating GERD, initiated nasal spray for possible postnasal drip/allergies contribute to these symptoms. Normal lung exam.  Plan for new referral to rheumatology for optimization of management of rheumatoid arthritis given other complicated medical comorbidities.  Plan to continue current dose of thyroid medication, last time we went up to 112 g TSH levels dropped, plan to repeat labs in 2-3 months, if stable at that point may consider alternating 101 112 pg dose.  Zenker's diverticulum - Plan: Ambulatory referral to Gastroenterology  Dry cough - Plan: fluticasone (FLONASE) 50 MCG/ACT nasal spray  Rheumatoid arthritis, involving unspecified site, unspecified rheumatoid factor  presence ([place]) - Plan: Ambulatory referral to Rheumatology  Postoperative hypothyroidism - Plan: Thyroid Panel With TSH  Dysphagia, unspecified type - Plan: Ambulatory referral to Gastroenterology      Visit summary with medication list and pertinent instructions was printed for patient to review. All questions at time of visit were answered - patient instructed to contact office with any additional concerns. ER/RTC precautions were reviewed with the patient. Follow-up plan: Return for annual physical when due, 2-3 months recheck thyroid, 4 weeks if no better, sooner if needed.

## 2016-08-29 DIAGNOSIS — Z23 Encounter for immunization: Secondary | ICD-10-CM | POA: Diagnosis not present

## 2016-08-29 LAB — THYROID PANEL WITH TSH
FREE THYROXINE INDEX: 2.7 (ref 1.4–3.8)
T3 UPTAKE: 32 % (ref 22–35)
T4 TOTAL: 8.5 ug/dL (ref 4.5–12.0)
TSH: 5.18 mIU/L — ABNORMAL HIGH

## 2016-09-04 ENCOUNTER — Other Ambulatory Visit: Payer: Self-pay | Admitting: Osteopathic Medicine

## 2016-09-10 IMAGING — CR DG ANKLE COMPLETE 3+V*L*
3 series · 3 of 3 positions shown · non-contrast
Comparison: None.

CLINICAL DATA: Status post ORIF.  Pain lateral malleolus.

EXAM:
LEFT ANKLE COMPLETE - 3+ VIEW

[t ankle joint ap left]
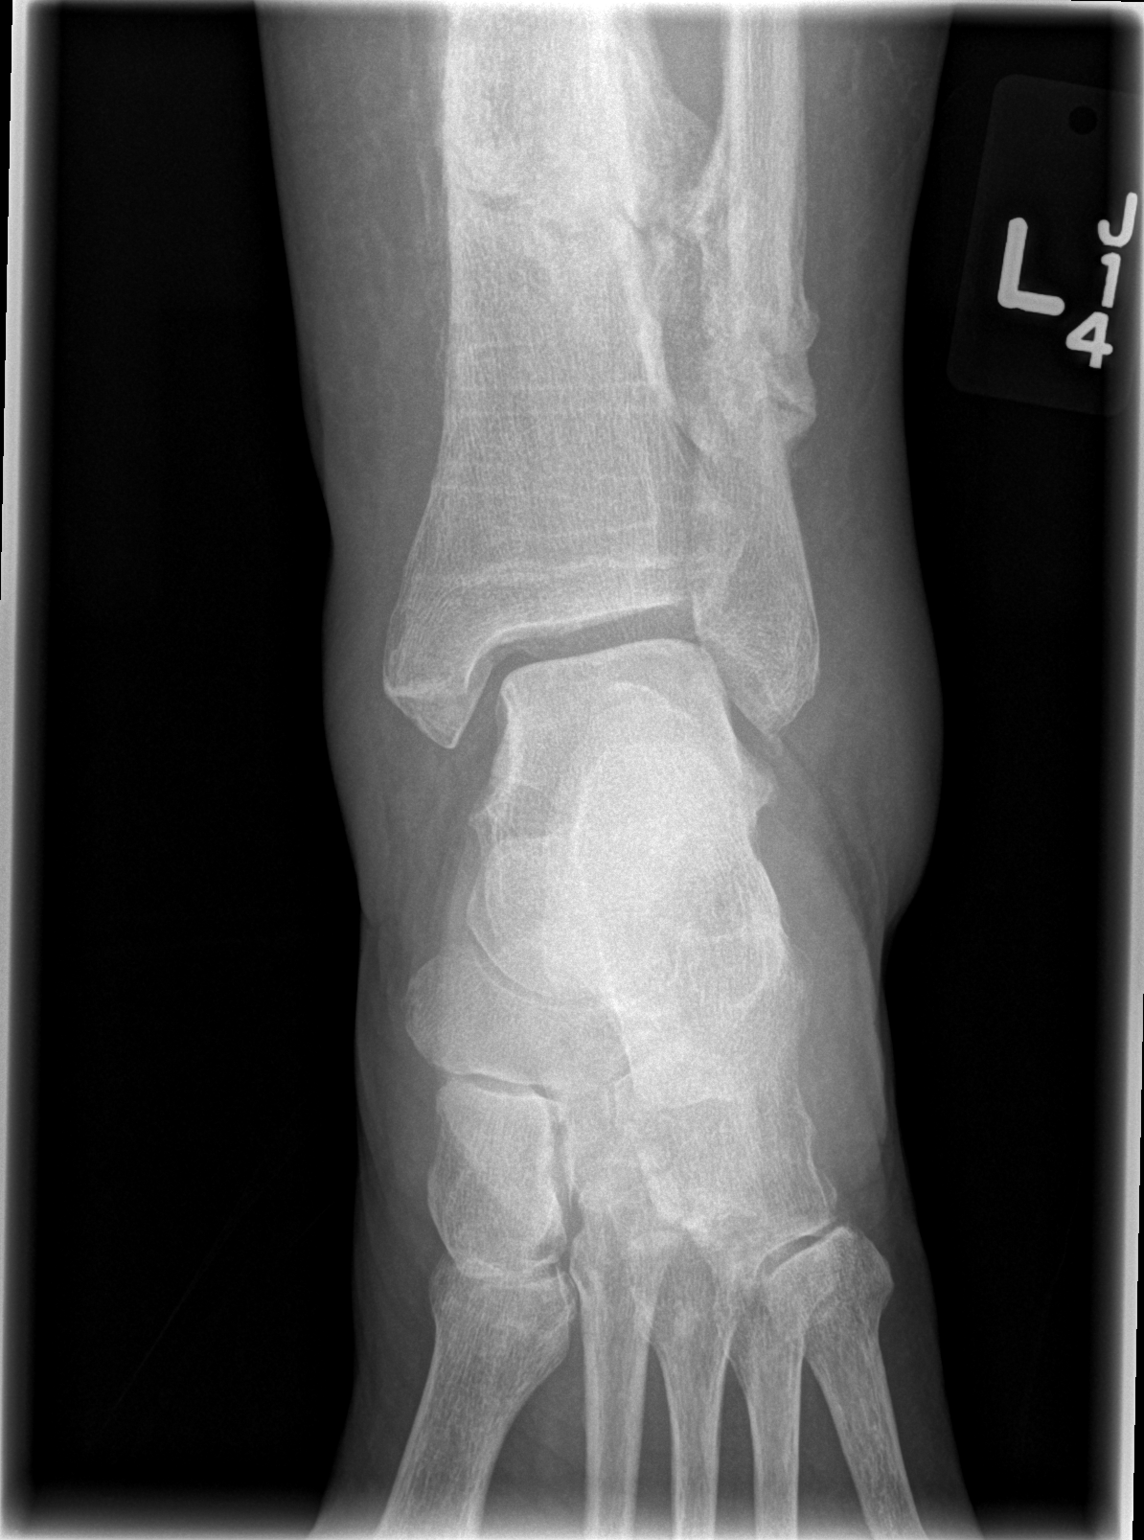

[t ankle joint oblique left]
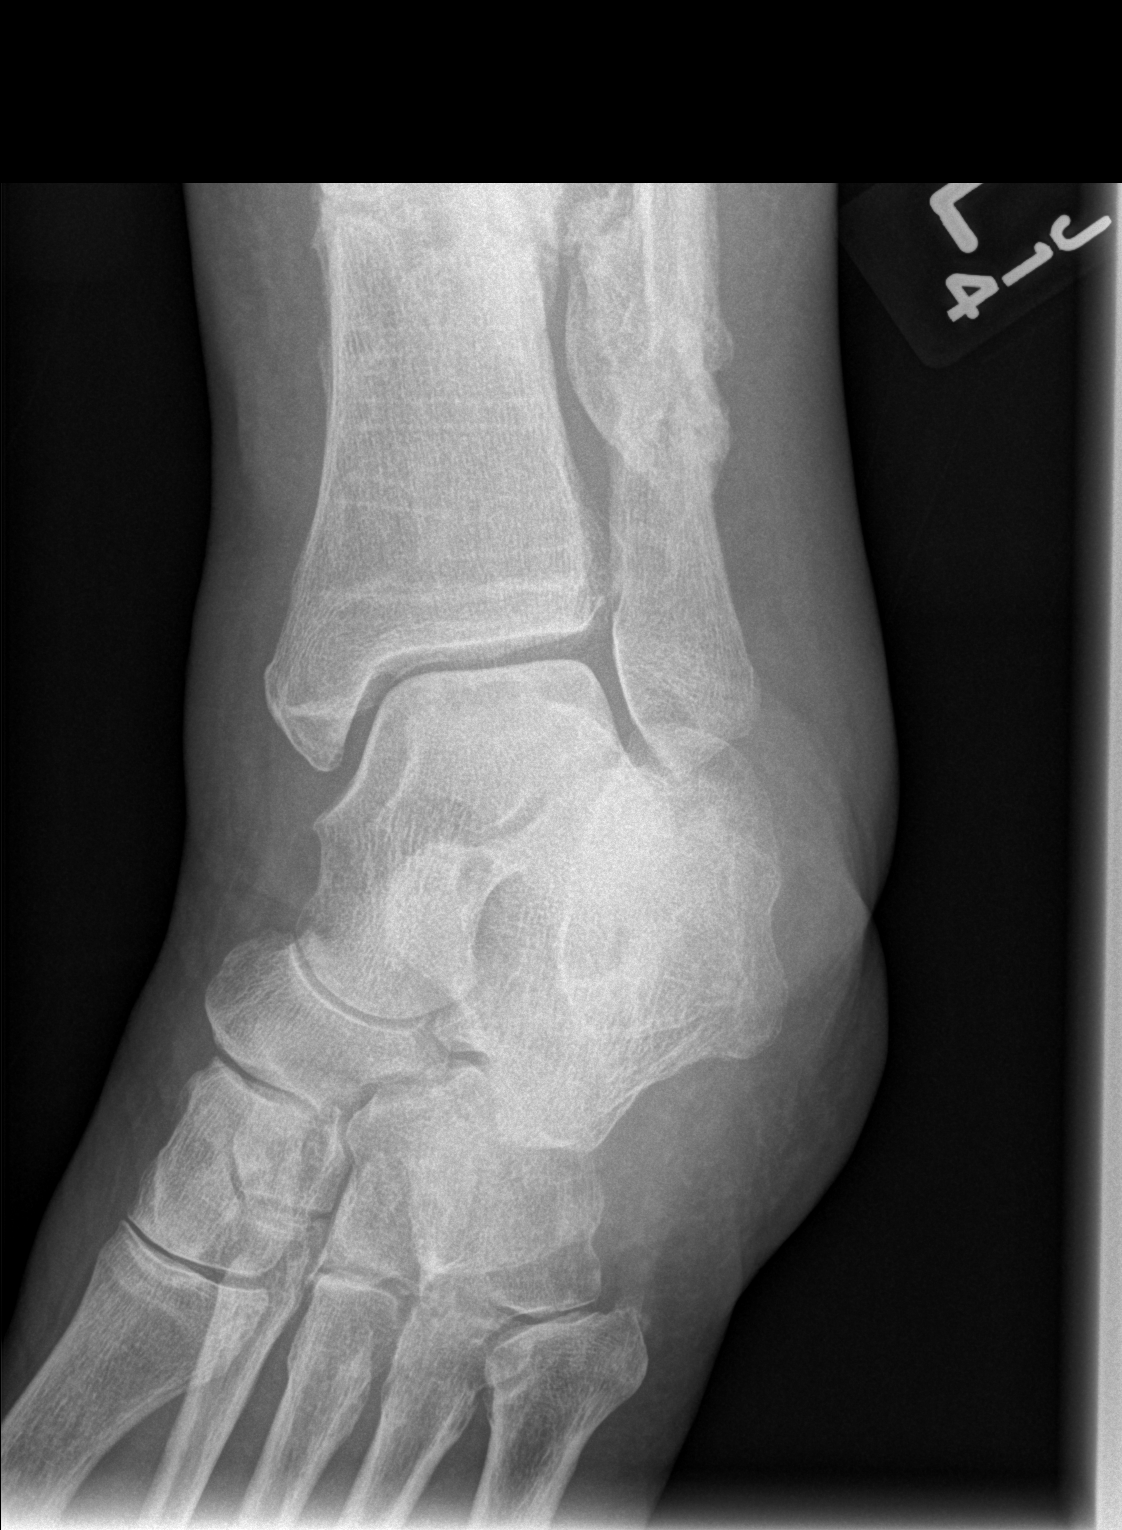

[t ankle joint lat left]
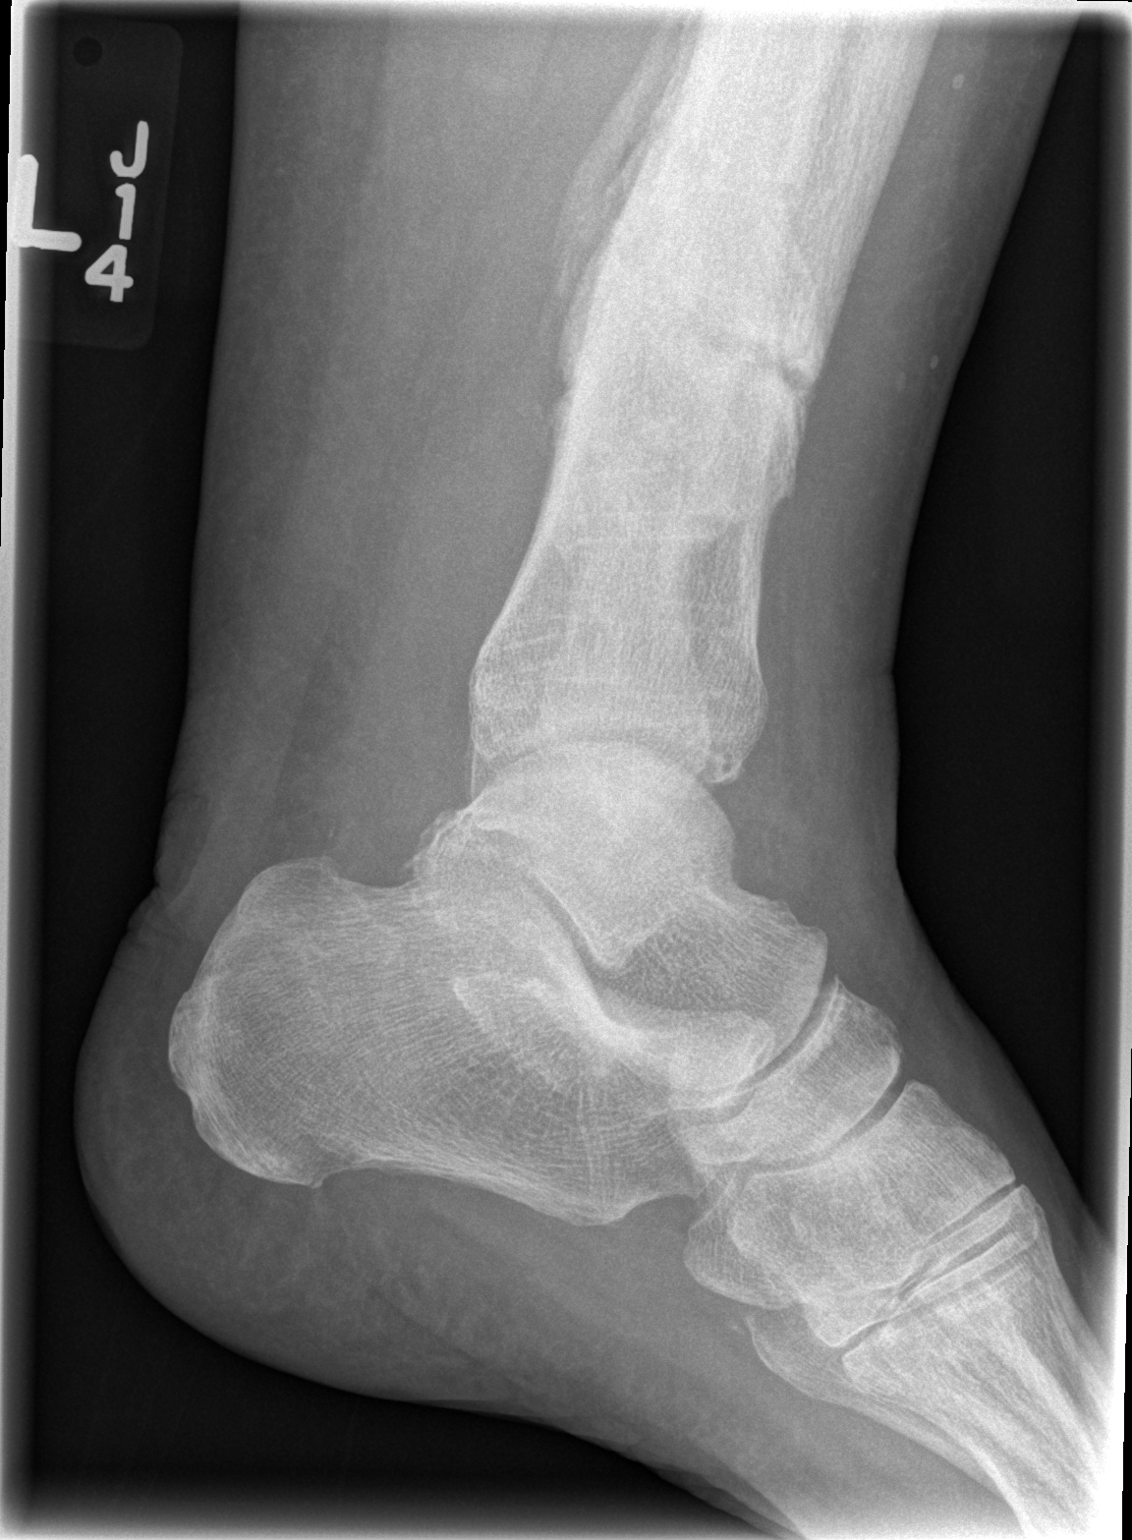

[3 of 3 positions shown; findings below may reference images not displayed]

FINDINGS: Distal tibial and fibular fractures with incomplete healing. Marked
periosteal and interosseous osseous proliferation. Marked soft
tissue swelling. No visible hardware.
IMPRESSION: Possible pseudarthrosis distal tibial and fibular fractures. Marked
soft tissue swelling.

## 2016-10-17 ENCOUNTER — Other Ambulatory Visit: Payer: Self-pay | Admitting: Osteopathic Medicine

## 2016-10-20 DIAGNOSIS — G20 Parkinson's disease: Secondary | ICD-10-CM | POA: Diagnosis not present

## 2016-10-20 DIAGNOSIS — E782 Mixed hyperlipidemia: Secondary | ICD-10-CM | POA: Diagnosis present

## 2016-10-20 DIAGNOSIS — E669 Obesity, unspecified: Secondary | ICD-10-CM | POA: Diagnosis present

## 2016-10-20 DIAGNOSIS — I10 Essential (primary) hypertension: Secondary | ICD-10-CM | POA: Diagnosis not present

## 2016-10-20 DIAGNOSIS — I313 Pericardial effusion (noninflammatory): Secondary | ICD-10-CM | POA: Diagnosis not present

## 2016-10-20 DIAGNOSIS — E6609 Other obesity due to excess calories: Secondary | ICD-10-CM | POA: Diagnosis not present

## 2016-10-20 DIAGNOSIS — J811 Chronic pulmonary edema: Secondary | ICD-10-CM | POA: Diagnosis not present

## 2016-10-20 DIAGNOSIS — M069 Rheumatoid arthritis, unspecified: Secondary | ICD-10-CM | POA: Diagnosis present

## 2016-10-20 DIAGNOSIS — Z885 Allergy status to narcotic agent status: Secondary | ICD-10-CM | POA: Diagnosis not present

## 2016-10-20 DIAGNOSIS — F458 Other somatoform disorders: Secondary | ICD-10-CM | POA: Diagnosis not present

## 2016-10-20 DIAGNOSIS — Z6834 Body mass index (BMI) 34.0-34.9, adult: Secondary | ICD-10-CM | POA: Diagnosis not present

## 2016-10-20 DIAGNOSIS — J9811 Atelectasis: Secondary | ICD-10-CM | POA: Diagnosis not present

## 2016-10-20 DIAGNOSIS — R262 Difficulty in walking, not elsewhere classified: Secondary | ICD-10-CM | POA: Diagnosis not present

## 2016-10-20 DIAGNOSIS — Z79899 Other long term (current) drug therapy: Secondary | ICD-10-CM | POA: Diagnosis not present

## 2016-10-20 DIAGNOSIS — I129 Hypertensive chronic kidney disease with stage 1 through stage 4 chronic kidney disease, or unspecified chronic kidney disease: Secondary | ICD-10-CM | POA: Diagnosis present

## 2016-10-20 DIAGNOSIS — Z91040 Latex allergy status: Secondary | ICD-10-CM | POA: Diagnosis not present

## 2016-10-20 DIAGNOSIS — Z9689 Presence of other specified functional implants: Secondary | ICD-10-CM | POA: Diagnosis not present

## 2016-10-20 DIAGNOSIS — J159 Unspecified bacterial pneumonia: Secondary | ICD-10-CM | POA: Diagnosis not present

## 2016-10-20 DIAGNOSIS — N183 Chronic kidney disease, stage 3 (moderate): Secondary | ICD-10-CM | POA: Diagnosis present

## 2016-10-20 DIAGNOSIS — K225 Diverticulum of esophagus, acquired: Secondary | ICD-10-CM | POA: Diagnosis present

## 2016-10-20 DIAGNOSIS — R1310 Dysphagia, unspecified: Secondary | ICD-10-CM | POA: Diagnosis not present

## 2016-10-20 DIAGNOSIS — R0602 Shortness of breath: Secondary | ICD-10-CM | POA: Diagnosis not present

## 2016-10-20 DIAGNOSIS — I517 Cardiomegaly: Secondary | ICD-10-CM | POA: Diagnosis not present

## 2016-10-20 DIAGNOSIS — R198 Other specified symptoms and signs involving the digestive system and abdomen: Secondary | ICD-10-CM | POA: Diagnosis present

## 2016-10-20 DIAGNOSIS — I959 Hypotension, unspecified: Secondary | ICD-10-CM | POA: Diagnosis not present

## 2016-10-20 DIAGNOSIS — R531 Weakness: Secondary | ICD-10-CM | POA: Diagnosis not present

## 2016-10-20 DIAGNOSIS — R490 Dysphonia: Secondary | ICD-10-CM | POA: Diagnosis present

## 2016-10-20 DIAGNOSIS — K219 Gastro-esophageal reflux disease without esophagitis: Secondary | ICD-10-CM | POA: Diagnosis not present

## 2016-10-20 DIAGNOSIS — Z969 Presence of functional implant, unspecified: Secondary | ICD-10-CM | POA: Diagnosis present

## 2016-10-20 DIAGNOSIS — J189 Pneumonia, unspecified organism: Secondary | ICD-10-CM | POA: Diagnosis not present

## 2016-10-24 DIAGNOSIS — R490 Dysphonia: Secondary | ICD-10-CM | POA: Diagnosis not present

## 2016-10-24 DIAGNOSIS — M069 Rheumatoid arthritis, unspecified: Secondary | ICD-10-CM | POA: Diagnosis not present

## 2016-10-24 DIAGNOSIS — J159 Unspecified bacterial pneumonia: Secondary | ICD-10-CM | POA: Diagnosis not present

## 2016-10-24 DIAGNOSIS — G20 Parkinson's disease: Secondary | ICD-10-CM | POA: Diagnosis not present

## 2016-10-24 DIAGNOSIS — I129 Hypertensive chronic kidney disease with stage 1 through stage 4 chronic kidney disease, or unspecified chronic kidney disease: Secondary | ICD-10-CM | POA: Diagnosis not present

## 2016-10-24 DIAGNOSIS — R1310 Dysphagia, unspecified: Secondary | ICD-10-CM | POA: Diagnosis not present

## 2016-10-25 DIAGNOSIS — G20 Parkinson's disease: Secondary | ICD-10-CM | POA: Diagnosis not present

## 2016-10-25 DIAGNOSIS — J159 Unspecified bacterial pneumonia: Secondary | ICD-10-CM | POA: Diagnosis not present

## 2016-10-25 DIAGNOSIS — R490 Dysphonia: Secondary | ICD-10-CM | POA: Diagnosis not present

## 2016-10-25 DIAGNOSIS — R1310 Dysphagia, unspecified: Secondary | ICD-10-CM | POA: Diagnosis not present

## 2016-10-25 DIAGNOSIS — I129 Hypertensive chronic kidney disease with stage 1 through stage 4 chronic kidney disease, or unspecified chronic kidney disease: Secondary | ICD-10-CM | POA: Diagnosis not present

## 2016-10-25 DIAGNOSIS — M069 Rheumatoid arthritis, unspecified: Secondary | ICD-10-CM | POA: Diagnosis not present

## 2016-10-27 DIAGNOSIS — M069 Rheumatoid arthritis, unspecified: Secondary | ICD-10-CM | POA: Diagnosis not present

## 2016-10-27 DIAGNOSIS — G20 Parkinson's disease: Secondary | ICD-10-CM | POA: Diagnosis not present

## 2016-10-27 DIAGNOSIS — R490 Dysphonia: Secondary | ICD-10-CM | POA: Diagnosis not present

## 2016-10-27 DIAGNOSIS — J159 Unspecified bacterial pneumonia: Secondary | ICD-10-CM | POA: Diagnosis not present

## 2016-10-27 DIAGNOSIS — I129 Hypertensive chronic kidney disease with stage 1 through stage 4 chronic kidney disease, or unspecified chronic kidney disease: Secondary | ICD-10-CM | POA: Diagnosis not present

## 2016-10-27 DIAGNOSIS — R1310 Dysphagia, unspecified: Secondary | ICD-10-CM | POA: Diagnosis not present

## 2016-10-30 DIAGNOSIS — M069 Rheumatoid arthritis, unspecified: Secondary | ICD-10-CM | POA: Diagnosis not present

## 2016-10-30 DIAGNOSIS — G20 Parkinson's disease: Secondary | ICD-10-CM | POA: Diagnosis not present

## 2016-10-30 DIAGNOSIS — R1310 Dysphagia, unspecified: Secondary | ICD-10-CM | POA: Diagnosis not present

## 2016-10-30 DIAGNOSIS — I129 Hypertensive chronic kidney disease with stage 1 through stage 4 chronic kidney disease, or unspecified chronic kidney disease: Secondary | ICD-10-CM | POA: Diagnosis not present

## 2016-10-30 DIAGNOSIS — R490 Dysphonia: Secondary | ICD-10-CM | POA: Diagnosis not present

## 2016-10-30 DIAGNOSIS — J159 Unspecified bacterial pneumonia: Secondary | ICD-10-CM | POA: Diagnosis not present

## 2016-10-31 DIAGNOSIS — I129 Hypertensive chronic kidney disease with stage 1 through stage 4 chronic kidney disease, or unspecified chronic kidney disease: Secondary | ICD-10-CM | POA: Diagnosis not present

## 2016-10-31 DIAGNOSIS — J159 Unspecified bacterial pneumonia: Secondary | ICD-10-CM | POA: Diagnosis not present

## 2016-10-31 DIAGNOSIS — G20 Parkinson's disease: Secondary | ICD-10-CM | POA: Diagnosis not present

## 2016-10-31 DIAGNOSIS — M069 Rheumatoid arthritis, unspecified: Secondary | ICD-10-CM | POA: Diagnosis not present

## 2016-10-31 DIAGNOSIS — R490 Dysphonia: Secondary | ICD-10-CM | POA: Diagnosis not present

## 2016-10-31 DIAGNOSIS — R1310 Dysphagia, unspecified: Secondary | ICD-10-CM | POA: Diagnosis not present

## 2016-11-01 DIAGNOSIS — I129 Hypertensive chronic kidney disease with stage 1 through stage 4 chronic kidney disease, or unspecified chronic kidney disease: Secondary | ICD-10-CM | POA: Diagnosis not present

## 2016-11-01 DIAGNOSIS — J159 Unspecified bacterial pneumonia: Secondary | ICD-10-CM | POA: Diagnosis not present

## 2016-11-01 DIAGNOSIS — R1310 Dysphagia, unspecified: Secondary | ICD-10-CM | POA: Diagnosis not present

## 2016-11-01 DIAGNOSIS — R490 Dysphonia: Secondary | ICD-10-CM | POA: Diagnosis not present

## 2016-11-01 DIAGNOSIS — G20 Parkinson's disease: Secondary | ICD-10-CM | POA: Diagnosis not present

## 2016-11-01 DIAGNOSIS — M069 Rheumatoid arthritis, unspecified: Secondary | ICD-10-CM | POA: Diagnosis not present

## 2016-11-03 DIAGNOSIS — R1310 Dysphagia, unspecified: Secondary | ICD-10-CM | POA: Diagnosis not present

## 2016-11-03 DIAGNOSIS — G20 Parkinson's disease: Secondary | ICD-10-CM | POA: Diagnosis not present

## 2016-11-03 DIAGNOSIS — I129 Hypertensive chronic kidney disease with stage 1 through stage 4 chronic kidney disease, or unspecified chronic kidney disease: Secondary | ICD-10-CM | POA: Diagnosis not present

## 2016-11-03 DIAGNOSIS — R490 Dysphonia: Secondary | ICD-10-CM | POA: Diagnosis not present

## 2016-11-03 DIAGNOSIS — M069 Rheumatoid arthritis, unspecified: Secondary | ICD-10-CM | POA: Diagnosis not present

## 2016-11-03 DIAGNOSIS — J159 Unspecified bacterial pneumonia: Secondary | ICD-10-CM | POA: Diagnosis not present

## 2016-11-06 DIAGNOSIS — G20 Parkinson's disease: Secondary | ICD-10-CM | POA: Diagnosis not present

## 2016-11-06 DIAGNOSIS — I129 Hypertensive chronic kidney disease with stage 1 through stage 4 chronic kidney disease, or unspecified chronic kidney disease: Secondary | ICD-10-CM | POA: Diagnosis not present

## 2016-11-06 DIAGNOSIS — R1310 Dysphagia, unspecified: Secondary | ICD-10-CM | POA: Diagnosis not present

## 2016-11-06 DIAGNOSIS — R490 Dysphonia: Secondary | ICD-10-CM | POA: Diagnosis not present

## 2016-11-06 DIAGNOSIS — J159 Unspecified bacterial pneumonia: Secondary | ICD-10-CM | POA: Diagnosis not present

## 2016-11-06 DIAGNOSIS — M069 Rheumatoid arthritis, unspecified: Secondary | ICD-10-CM | POA: Diagnosis not present

## 2016-11-07 DIAGNOSIS — M069 Rheumatoid arthritis, unspecified: Secondary | ICD-10-CM | POA: Diagnosis not present

## 2016-11-07 DIAGNOSIS — G20 Parkinson's disease: Secondary | ICD-10-CM | POA: Diagnosis not present

## 2016-11-07 DIAGNOSIS — R490 Dysphonia: Secondary | ICD-10-CM | POA: Diagnosis not present

## 2016-11-07 DIAGNOSIS — J159 Unspecified bacterial pneumonia: Secondary | ICD-10-CM | POA: Diagnosis not present

## 2016-11-07 DIAGNOSIS — R1310 Dysphagia, unspecified: Secondary | ICD-10-CM | POA: Diagnosis not present

## 2016-11-07 DIAGNOSIS — I129 Hypertensive chronic kidney disease with stage 1 through stage 4 chronic kidney disease, or unspecified chronic kidney disease: Secondary | ICD-10-CM | POA: Diagnosis not present

## 2016-11-09 DIAGNOSIS — R490 Dysphonia: Secondary | ICD-10-CM | POA: Diagnosis not present

## 2016-11-09 DIAGNOSIS — J159 Unspecified bacterial pneumonia: Secondary | ICD-10-CM | POA: Diagnosis not present

## 2016-11-09 DIAGNOSIS — M069 Rheumatoid arthritis, unspecified: Secondary | ICD-10-CM | POA: Diagnosis not present

## 2016-11-09 DIAGNOSIS — G20 Parkinson's disease: Secondary | ICD-10-CM | POA: Diagnosis not present

## 2016-11-09 DIAGNOSIS — R1310 Dysphagia, unspecified: Secondary | ICD-10-CM | POA: Diagnosis not present

## 2016-11-09 DIAGNOSIS — I129 Hypertensive chronic kidney disease with stage 1 through stage 4 chronic kidney disease, or unspecified chronic kidney disease: Secondary | ICD-10-CM | POA: Diagnosis not present

## 2016-11-10 DIAGNOSIS — J159 Unspecified bacterial pneumonia: Secondary | ICD-10-CM | POA: Diagnosis not present

## 2016-11-10 DIAGNOSIS — R1310 Dysphagia, unspecified: Secondary | ICD-10-CM | POA: Diagnosis not present

## 2016-11-10 DIAGNOSIS — G20 Parkinson's disease: Secondary | ICD-10-CM | POA: Diagnosis not present

## 2016-11-10 DIAGNOSIS — M069 Rheumatoid arthritis, unspecified: Secondary | ICD-10-CM | POA: Diagnosis not present

## 2016-11-10 DIAGNOSIS — R490 Dysphonia: Secondary | ICD-10-CM | POA: Diagnosis not present

## 2016-11-10 DIAGNOSIS — I129 Hypertensive chronic kidney disease with stage 1 through stage 4 chronic kidney disease, or unspecified chronic kidney disease: Secondary | ICD-10-CM | POA: Diagnosis not present

## 2016-11-10 DIAGNOSIS — K225 Diverticulum of esophagus, acquired: Secondary | ICD-10-CM | POA: Diagnosis not present

## 2016-11-13 ENCOUNTER — Encounter: Payer: Self-pay | Admitting: Osteopathic Medicine

## 2016-11-13 ENCOUNTER — Ambulatory Visit (INDEPENDENT_AMBULATORY_CARE_PROVIDER_SITE_OTHER): Payer: Medicare Other | Admitting: Osteopathic Medicine

## 2016-11-13 ENCOUNTER — Other Ambulatory Visit: Payer: Self-pay | Admitting: Osteopathic Medicine

## 2016-11-13 VITALS — BP 133/88 | HR 86 | Ht 60.0 in | Wt 173.0 lb

## 2016-11-13 DIAGNOSIS — E039 Hypothyroidism, unspecified: Secondary | ICD-10-CM | POA: Diagnosis not present

## 2016-11-13 DIAGNOSIS — I10 Essential (primary) hypertension: Secondary | ICD-10-CM | POA: Diagnosis not present

## 2016-11-13 DIAGNOSIS — R262 Difficulty in walking, not elsewhere classified: Secondary | ICD-10-CM

## 2016-11-13 DIAGNOSIS — K225 Diverticulum of esophagus, acquired: Secondary | ICD-10-CM

## 2016-11-13 DIAGNOSIS — G20 Parkinson's disease: Secondary | ICD-10-CM | POA: Diagnosis not present

## 2016-11-13 DIAGNOSIS — E559 Vitamin D deficiency, unspecified: Secondary | ICD-10-CM

## 2016-11-13 DIAGNOSIS — J189 Pneumonia, unspecified organism: Secondary | ICD-10-CM

## 2016-11-13 DIAGNOSIS — M069 Rheumatoid arthritis, unspecified: Secondary | ICD-10-CM | POA: Diagnosis not present

## 2016-11-13 DIAGNOSIS — F418 Other specified anxiety disorders: Secondary | ICD-10-CM | POA: Diagnosis not present

## 2016-11-13 DIAGNOSIS — G20A1 Parkinson's disease without dyskinesia, without mention of fluctuations: Secondary | ICD-10-CM

## 2016-11-13 MED ORDER — PANTOPRAZOLE SODIUM 40 MG PO TBEC: 40.0000 mg | DELAYED_RELEASE_TABLET | Freq: Every day | ORAL | 1 refills | Status: AC

## 2016-11-13 MED ORDER — PRAMIPEXOLE DIHYDROCHLORIDE 0.5 MG PO TABS: 0.5000 mg | ORAL_TABLET | Freq: Every day | ORAL | 5 refills | Status: AC

## 2016-11-13 MED ORDER — VALSARTAN 320 MG PO TABS: 320.0000 mg | ORAL_TABLET | Freq: Every day | ORAL | 1 refills | Status: DC

## 2016-11-13 MED ORDER — VITAMIN B-12 1000 MCG PO TABS: ORAL_TABLET | ORAL | 1 refills | Status: AC

## 2016-11-13 MED ORDER — CARBIDOPA-LEVODOPA 25-100 MG PO TABS: ORAL_TABLET | ORAL | 5 refills | Status: AC

## 2016-11-13 MED ORDER — AMANTADINE HCL 100 MG PO CAPS: 100.0000 mg | ORAL_CAPSULE | Freq: Three times a day (TID) | ORAL | 5 refills | Status: AC

## 2016-11-13 MED ORDER — ESCITALOPRAM OXALATE 5 MG PO TABS: 5.0000 mg | ORAL_TABLET | Freq: Every day | ORAL | 1 refills | Status: AC

## 2016-11-13 MED ORDER — METHOTREXATE 2.5 MG PO TABS: 15.0000 mg | ORAL_TABLET | ORAL | 1 refills | Status: AC

## 2016-11-13 MED ORDER — MIRTAZAPINE 15 MG PO TABS: 15.0000 mg | ORAL_TABLET | Freq: Every day | ORAL | 1 refills | Status: AC

## 2016-11-13 NOTE — Progress Notes (Signed)
HPI: Shannon Vincent is a 82 y.o. female  who presents to [facility] today, 11/13/16,  for chief complaint of:  Chief Complaint  Patient presents with  . Hospitalization Follow-up    LEG WEAKNESS AND DIFFICULT SWALLOWING    Leg weakness: Patient reports feels a bit unsteady on her feet. Has been doing a little bit better over the course the past 2 weeks with physical therapy coming into the house. No unilateral weakness, no additional falling.   Difficulty swallowing: With ENT for Zenker's diverticulum. Planning to see specialist in [facility] regarding flexible endoscopic repair which apparently requires some expertise. There was some concern for possible aspiration pneumonia, patient has not had any episodes of choking problems severe.   MSK: Incidental finding of left 11th and 12th posterior lateral rib fractures on CT as noted below. Patient not aware of any trauma, not complaining of any pain in that area, they appeared acute on CT but she is not complaining of any issues there. Does sever frequent falls due to poor angulation, parkinsonism   Patient recently hospitalized for community-acquired pneumonia. Presented with chronic and progressive dyspnea developing into sudden and severe shortness of breath. Associated dysphasia/dysphonia over the past few weeks which is also been progressing, history of Zenker's diverticulum, has already seen ENT for this, see note in problem list. Bilateral and hypotensive tachycardic in the ED.   Chest CT reviewed 10/20/2016: Atelectasis, concern for acute posterior lateral tendon 11th rib fractures, atherosclerosis including coronary artery disease, small pericardial effusion.  "Patient was admitted to telemetry floor and treated for the following: 1) probable community acquired bacterial PNA, possibly secondary to gram positive bacteria-respiratory panel negative. Continued on empiric abx. Echo was performed  which was unremarkable with normal EF and mild concentric LVH.   2) dysphagia/dysphonia/globus sensation and hx of Zenker's diverticulum-concern for progression of Parkinson's-Seen by speech therapy who perfomed MBS. Consistent trace penetration with most bites/sips from pooling material in pyriform from REFLUX back into pharynx. Pt feels, coughs, clears. Probable trace asp x1, cough, and cleared. Rec: mech soft, reg liquids, safe swallow strategies, multiple small meals instead of 3 reg sized meals  3) Parkinson's disease with EBS with ambulatory dysfunction and generalized muscle weakness-continued on home PD meds  4) chronic kidney disease stage 3, GFR 30-59 mL/min-stable. Continue to avoid nephrotoxins.   5) essential hypertension and mixed hyperlipidemia-continued on home meds   6) class I obesity-counseled on diet/exercise.   On day of discharge, patient was saturating well on room air and eager to go home. She will be discharged in stable condition to complete course of cefdinir, flagyl, and doxy. She understands that she will need to f/u with PCP in 1 week. She should also plan to see GI for evaluation of zenker's."    Past medical history, surgical history, social history and family history reviewed.  Patient Active Problem List   Diagnosis Date Noted  . Zenker diverticula 11/13/2016  . Ambulatory dysfunction 05/03/2016  . Traumatic hematoma of forehead 03/31/2016  . Breast cancer screening 01/03/2016  . UTI (urinary tract infection) 10/29/2015  . Vitamin D deficiency 07/29/2015  . Absolute anemia 07/29/2015  . Thyroid activity decreased 07/29/2015  . Seasonal allergies 07/29/2015  . Postmenopausal 07/29/2015  . S/P surgical manipulation of ankle joint 07/29/2015  . OBESITY, UNSPECIFIED 12/02/2010  . FREQUENCY, URINARY 07/01/2010  . ESSENTIAL HYPERTENSION, BENIGN 04/01/2010  . HYPERLIPIDEMIA 02/22/2010  . ANXIETY STATE, UNSPECIFIED 02/22/2010  . Parkinson disease ([place])  02/22/2010  . OSTEOARTHRITIS, MODERATE 02/22/2010    Current medication list and allergy/intolerance information reviewed.   Current Outpatient Prescriptions on File Prior to Visit  Medication Sig Dispense Refill  . acetaminophen (TYLENOL) 325 MG tablet Take 2 tablets (650 mg total) by mouth every 6 (six) hours as needed for mild pain, moderate pain, fever or headache. 90 tablet 1  . AMBULATORY NON FORMULARY MEDICATION Shower Chair - Diagnosis - Parkinson's disease ICD-10 G20 1 each 0  . aspirin 81 MG EC tablet TAKE ONE TABLET BY MOUTH EVERY DAY 30 tablet 0  . Calcium Carbonate-Vitamin D 600-400 MG-UNIT chew tablet Chew 2 tablets by mouth daily. (Patient taking differently: Chew 1 tablet by mouth daily. ) 60 tablet 12  . cetirizine (ZYRTEC) 5 MG tablet Take 1 tablet (5 mg total) by mouth daily as needed for allergies or rhinitis. 30 tablet 12  . fluticasone (FLONASE) 50 MCG/ACT nasal spray Place 2 sprays into both nostrils daily. 16 g 6  . folic acid (FOLVITE) 1 MG tablet TAKE ONE TABLET BY MOUTH EVERY DAY 30 tablet 0  . levothyroxine (SYNTHROID, LEVOTHROID) 100 MCG tablet TAKE ONE TABLET BY MOUTH EVERY DAY BEFORE BREAKFAST 30 tablet 0  . SENEXON-S 8.6-50 MG tablet TAKE 2 TABLETS BY MOUTH AT BEDTIME AS NEEDED FOR MILD CONSTIPATION 60 tablet 0  . [DISCONTINUED] potassium chloride SA (K-DUR,KLOR-CON) 20 MEQ tablet Take 1 tablet (20 mEq total) by mouth daily. NEED FOLLOW UP VISIT FOR MORE REFILLS 30 tablet 0   No current facility-administered medications on file prior to visit.    Allergies  Allergen Reactions  . Codeine Nausea And Vomiting  . Morphine Diarrhea and Nausea And Vomiting  . Adhesive [Tape] Rash  . Latex Rash      Review of Systems:  Constitutional: +recent illness  HEENT: No  headache, no vision change,   Cardiac: No  chest pain, No  pressure, No palpitations  Respiratory:  +shortness of breath improving slowly, No  Cough  Gastrointestinal: No  abdominal pain, no  change on bowel habits  Exam:  BP 133/88   Pulse 86   Ht 5' (1.524 m)   Wt 173 lb (78.5 kg)   BMI 33.79 kg/m   Constitutional: VS see above. General Appearance: alert, frail, NAD  Eyes: Normal lids and conjunctive, non-icteric sclera  Ears, Nose, Mouth, Throat: MMM, Normal external inspection ears/nares/mouth/lips/gums.  Neck: No masses, trachea midline.   Respiratory: Normal respiratory effort. no wheeze, no rhonchi, no rales  Cardiovascular: S1/S2 normal, no murmur, no rub/gallop auscultated. RRR.   Musculoskeletal: Gait normal. Symmetric and independent movement of all extremities  Neurological: Normal balance/coordination. +tremor.  Skin: warm, dry, intact.   Psychiatric: Normal judgment/insight. Normal mood and affect. Oriented x3.       ASSESSMENT/PLAN: Confirmed Parkinson's medication list with most recent notes from neurology. Patient advised also needs to follow-up with rheumatology, will get routine labs today. Patient's daughter's daughter-in-law helps her with medications, advised that this person review list printed on visit summary and let us know if this is or is not consistent with what the patient is getting. There have been problems in the past with her taking her thyroid medications  Community acquired pneumonia, unspecified laterality - Recovering with home health/physical therapy. Questionable aspiration versus whether rib fractures are related  Essential hypertension, benign - Plan: CBC with Differential/Platelet, COMPLETE METABOLIC PANEL WITH GFR, Thyroid Panel With TSH, VITAMIN D 25 Hydroxy (Vit-D Deficiency, Fractures), valsartan (DIOVAN) 320 MG tablet  Vitamin D  deficiency - Plan: VITAMIN D 25 Hydroxy (Vit-D Deficiency, Fractures)  Hypothyroidism, unspecified type - Plan: Thyroid Panel With TSH  Zenker diverticula  Parkinson disease ([place]) - Plan: amantadine (SYMMETREL) 100 MG capsule, pramipexole (MIRAPEX) 0.5 MG tablet, carbidopa-levodopa  (SINEMET IR) 25-100 MG tablet  Ambulatory dysfunction - continue PT  Depression with anxiety - Plan: mirtazapine (REMERON) 15 MG tablet, escitalopram (LEXAPRO) 5 MG tablet  Rheumatoid arthritis, involving unspecified site, unspecified rheumatoid factor presence ([place]) - Plan: methotrexate (RHEUMATREX) 2.5 MG tablet    Follow-up plan: Return in about 3 months (around 02/10/2017) for ROUTINE CHECKUP AND LABS .  Visit summary with medication list and pertinent instructions was printed for patient to review, alert [place] if any changes needed. All questions at time of visit were answered - patient instructed to contact office with any additional concerns. ER/RTC precautions were reviewed with the patient and understanding verbalized.   Note: Total time spent 40 minutes, greater than 50% of the visit was spent face-to-face counseling and coordinating care for the following: The primary encounter diagnosis was Community acquired pneumonia, unspecified laterality. Diagnoses of Essential hypertension, benign, Vitamin D deficiency, Hypothyroidism, unspecified type, Zenker diverticula, Parkinson disease ([place]), Ambulatory dysfunction, Depression with anxiety, and Rheumatoid arthritis, involving unspecified site, unspecified rheumatoid factor presence ([place]) were also pertinent to this visit.Marland Kitchen

## 2016-11-14 DIAGNOSIS — R490 Dysphonia: Secondary | ICD-10-CM | POA: Diagnosis not present

## 2016-11-14 DIAGNOSIS — R1310 Dysphagia, unspecified: Secondary | ICD-10-CM | POA: Diagnosis not present

## 2016-11-14 DIAGNOSIS — J159 Unspecified bacterial pneumonia: Secondary | ICD-10-CM | POA: Diagnosis not present

## 2016-11-14 DIAGNOSIS — I129 Hypertensive chronic kidney disease with stage 1 through stage 4 chronic kidney disease, or unspecified chronic kidney disease: Secondary | ICD-10-CM | POA: Diagnosis not present

## 2016-11-14 DIAGNOSIS — M069 Rheumatoid arthritis, unspecified: Secondary | ICD-10-CM | POA: Diagnosis not present

## 2016-11-14 DIAGNOSIS — G20 Parkinson's disease: Secondary | ICD-10-CM | POA: Diagnosis not present

## 2016-11-14 LAB — CBC WITH DIFFERENTIAL/PLATELET
BASOS ABS: 64 {cells}/uL (ref 0–200)
BASOS PCT: 1 %
EOS ABS: 320 {cells}/uL (ref 15–500)
Eosinophils Relative: 5 %
HCT: 35.6 % (ref 35.0–45.0)
HEMOGLOBIN: 11 g/dL — AB (ref 11.7–15.5)
LYMPHS ABS: 1600 {cells}/uL (ref 850–3900)
Lymphocytes Relative: 25 %
MCH: 29.6 pg (ref 27.0–33.0)
MCHC: 30.9 g/dL — ABNORMAL LOW (ref 32.0–36.0)
MCV: 96 fL (ref 80.0–100.0)
MPV: 9.1 fL (ref 7.5–12.5)
Monocytes Absolute: 704 cells/uL (ref 200–950)
Monocytes Relative: 11 %
NEUTROS ABS: 3712 {cells}/uL (ref 1500–7800)
Neutrophils Relative %: 58 %
Platelets: 426 10*3/uL — ABNORMAL HIGH (ref 140–400)
RBC: 3.71 MIL/uL — ABNORMAL LOW (ref 3.80–5.10)
RDW: 16.6 % — ABNORMAL HIGH (ref 11.0–15.0)
WBC: 6.4 10*3/uL (ref 3.8–10.8)

## 2016-11-14 LAB — COMPLETE METABOLIC PANEL WITH GFR
ALBUMIN: 3.7 g/dL (ref 3.6–5.1)
ALK PHOS: 87 U/L (ref 33–130)
ALT: 4 U/L — ABNORMAL LOW (ref 6–29)
AST: 10 U/L (ref 10–35)
BILIRUBIN TOTAL: 0.4 mg/dL (ref 0.2–1.2)
BUN: 26 mg/dL — AB (ref 7–25)
CALCIUM: 9.1 mg/dL (ref 8.6–10.4)
CO2: 27 mmol/L (ref 20–31)
CREATININE: 1.21 mg/dL — AB (ref 0.60–0.93)
Chloride: 106 mmol/L (ref 98–110)
GFR, Est African American: 49 mL/min — ABNORMAL LOW (ref >=60)
GFR, Est Non African American: 43 mL/min — ABNORMAL LOW (ref >=60)
GLUCOSE: 86 mg/dL (ref 65–99)
Potassium: 5.5 mmol/L — ABNORMAL HIGH (ref 3.5–5.3)
SODIUM: 141 mmol/L (ref 135–146)
Total Protein: 7 g/dL (ref 6.1–8.1)

## 2016-11-14 LAB — THYROID PANEL WITH TSH
Free Thyroxine Index: 3.8 (ref 1.4–3.8)
T3 Uptake: 28 % (ref 22–35)
T4 TOTAL: 13.4 ug/dL — AB (ref 4.5–12.0)
TSH: 1.53 mIU/L

## 2016-11-14 LAB — VITAMIN D 25 HYDROXY (VIT D DEFICIENCY, FRACTURES): Vit D, 25-Hydroxy: 29 ng/mL — ABNORMAL LOW (ref 30–100)

## 2016-11-15 ENCOUNTER — Other Ambulatory Visit: Payer: Self-pay | Admitting: Osteopathic Medicine

## 2016-11-15 ENCOUNTER — Encounter: Payer: Self-pay | Admitting: Osteopathic Medicine

## 2016-11-15 DIAGNOSIS — E559 Vitamin D deficiency, unspecified: Secondary | ICD-10-CM

## 2016-11-15 MED ORDER — CALCIUM CARBONATE-VITAMIN D 600-400 MG-UNIT PO CHEW: 2.0000 | CHEWABLE_TABLET | Freq: Every day | ORAL | 12 refills | Status: AC

## 2016-11-15 MED ORDER — LEVOTHYROXINE SODIUM 100 MCG PO TABS
ORAL_TABLET | ORAL | 1 refills | Status: AC
Start: 2016-11-15 — End: ?

## 2016-11-16 DIAGNOSIS — I129 Hypertensive chronic kidney disease with stage 1 through stage 4 chronic kidney disease, or unspecified chronic kidney disease: Secondary | ICD-10-CM | POA: Diagnosis not present

## 2016-11-16 DIAGNOSIS — M069 Rheumatoid arthritis, unspecified: Secondary | ICD-10-CM | POA: Diagnosis not present

## 2016-11-16 DIAGNOSIS — G20 Parkinson's disease: Secondary | ICD-10-CM | POA: Diagnosis not present

## 2016-11-16 DIAGNOSIS — R490 Dysphonia: Secondary | ICD-10-CM | POA: Diagnosis not present

## 2016-11-16 DIAGNOSIS — J159 Unspecified bacterial pneumonia: Secondary | ICD-10-CM | POA: Diagnosis not present

## 2016-11-16 DIAGNOSIS — R1310 Dysphagia, unspecified: Secondary | ICD-10-CM | POA: Diagnosis not present

## 2016-11-21 DIAGNOSIS — J159 Unspecified bacterial pneumonia: Secondary | ICD-10-CM | POA: Diagnosis not present

## 2016-11-21 DIAGNOSIS — R1310 Dysphagia, unspecified: Secondary | ICD-10-CM | POA: Diagnosis not present

## 2016-11-21 DIAGNOSIS — M069 Rheumatoid arthritis, unspecified: Secondary | ICD-10-CM | POA: Diagnosis not present

## 2016-11-21 DIAGNOSIS — I129 Hypertensive chronic kidney disease with stage 1 through stage 4 chronic kidney disease, or unspecified chronic kidney disease: Secondary | ICD-10-CM | POA: Diagnosis not present

## 2016-11-21 DIAGNOSIS — R490 Dysphonia: Secondary | ICD-10-CM | POA: Diagnosis not present

## 2016-11-21 DIAGNOSIS — G20 Parkinson's disease: Secondary | ICD-10-CM | POA: Diagnosis not present

## 2016-11-23 DIAGNOSIS — G20 Parkinson's disease: Secondary | ICD-10-CM | POA: Diagnosis not present

## 2016-11-23 DIAGNOSIS — R1310 Dysphagia, unspecified: Secondary | ICD-10-CM | POA: Diagnosis not present

## 2016-11-23 DIAGNOSIS — I129 Hypertensive chronic kidney disease with stage 1 through stage 4 chronic kidney disease, or unspecified chronic kidney disease: Secondary | ICD-10-CM | POA: Diagnosis not present

## 2016-11-23 DIAGNOSIS — R490 Dysphonia: Secondary | ICD-10-CM | POA: Diagnosis not present

## 2016-11-23 DIAGNOSIS — M069 Rheumatoid arthritis, unspecified: Secondary | ICD-10-CM | POA: Diagnosis not present

## 2016-11-23 DIAGNOSIS — J159 Unspecified bacterial pneumonia: Secondary | ICD-10-CM | POA: Diagnosis not present

## 2016-11-29 DIAGNOSIS — R1310 Dysphagia, unspecified: Secondary | ICD-10-CM | POA: Diagnosis not present

## 2016-11-29 DIAGNOSIS — M069 Rheumatoid arthritis, unspecified: Secondary | ICD-10-CM | POA: Diagnosis not present

## 2016-11-29 DIAGNOSIS — R490 Dysphonia: Secondary | ICD-10-CM | POA: Diagnosis not present

## 2016-11-29 DIAGNOSIS — G20 Parkinson's disease: Secondary | ICD-10-CM | POA: Diagnosis not present

## 2016-11-29 DIAGNOSIS — I129 Hypertensive chronic kidney disease with stage 1 through stage 4 chronic kidney disease, or unspecified chronic kidney disease: Secondary | ICD-10-CM | POA: Diagnosis not present

## 2016-11-29 DIAGNOSIS — J159 Unspecified bacterial pneumonia: Secondary | ICD-10-CM | POA: Diagnosis not present

## 2016-11-30 DIAGNOSIS — I129 Hypertensive chronic kidney disease with stage 1 through stage 4 chronic kidney disease, or unspecified chronic kidney disease: Secondary | ICD-10-CM | POA: Diagnosis not present

## 2016-11-30 DIAGNOSIS — M069 Rheumatoid arthritis, unspecified: Secondary | ICD-10-CM | POA: Diagnosis not present

## 2016-11-30 DIAGNOSIS — G20 Parkinson's disease: Secondary | ICD-10-CM | POA: Diagnosis not present

## 2016-11-30 DIAGNOSIS — R490 Dysphonia: Secondary | ICD-10-CM | POA: Diagnosis not present

## 2016-11-30 DIAGNOSIS — R1310 Dysphagia, unspecified: Secondary | ICD-10-CM | POA: Diagnosis not present

## 2016-11-30 DIAGNOSIS — J159 Unspecified bacterial pneumonia: Secondary | ICD-10-CM | POA: Diagnosis not present

## 2016-12-05 DIAGNOSIS — M069 Rheumatoid arthritis, unspecified: Secondary | ICD-10-CM | POA: Diagnosis not present

## 2016-12-05 DIAGNOSIS — G20 Parkinson's disease: Secondary | ICD-10-CM | POA: Diagnosis not present

## 2016-12-05 DIAGNOSIS — J159 Unspecified bacterial pneumonia: Secondary | ICD-10-CM | POA: Diagnosis not present

## 2016-12-05 DIAGNOSIS — I129 Hypertensive chronic kidney disease with stage 1 through stage 4 chronic kidney disease, or unspecified chronic kidney disease: Secondary | ICD-10-CM | POA: Diagnosis not present

## 2016-12-05 DIAGNOSIS — R1310 Dysphagia, unspecified: Secondary | ICD-10-CM | POA: Diagnosis not present

## 2016-12-05 DIAGNOSIS — R490 Dysphonia: Secondary | ICD-10-CM | POA: Diagnosis not present

## 2016-12-07 DIAGNOSIS — R490 Dysphonia: Secondary | ICD-10-CM | POA: Diagnosis not present

## 2016-12-07 DIAGNOSIS — M069 Rheumatoid arthritis, unspecified: Secondary | ICD-10-CM | POA: Diagnosis not present

## 2016-12-07 DIAGNOSIS — J159 Unspecified bacterial pneumonia: Secondary | ICD-10-CM | POA: Diagnosis not present

## 2016-12-07 DIAGNOSIS — I129 Hypertensive chronic kidney disease with stage 1 through stage 4 chronic kidney disease, or unspecified chronic kidney disease: Secondary | ICD-10-CM | POA: Diagnosis not present

## 2016-12-07 DIAGNOSIS — R1310 Dysphagia, unspecified: Secondary | ICD-10-CM | POA: Diagnosis not present

## 2016-12-07 DIAGNOSIS — G20 Parkinson's disease: Secondary | ICD-10-CM | POA: Diagnosis not present

## 2016-12-09 IMAGING — US US EXTREM LOW VENOUS*L*
1 series · 13 of 24 positions shown · non-contrast
Comparison: None.

CLINICAL DATA: Left lower extremity for 1 week. History of ankle
fracture 5 months prior.



[Series 1: us extrem low venous*left* · 0.08mm/px · 29 acquisitions, 13 frames shown]
[im 1/29]
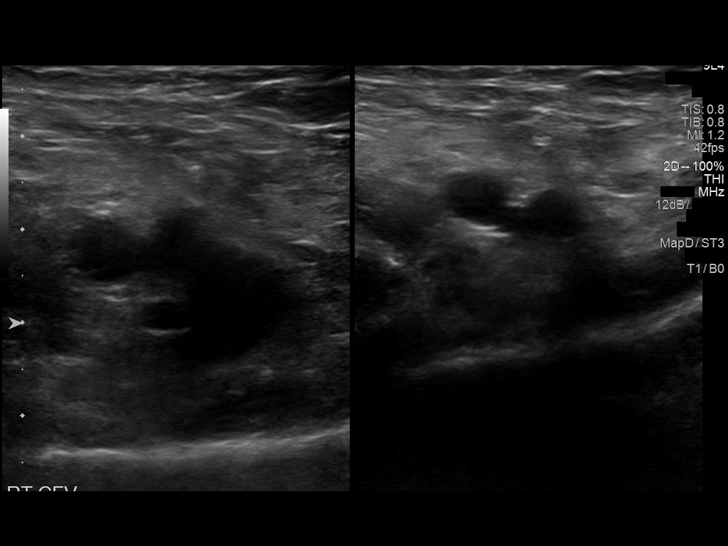
[im 3/29]
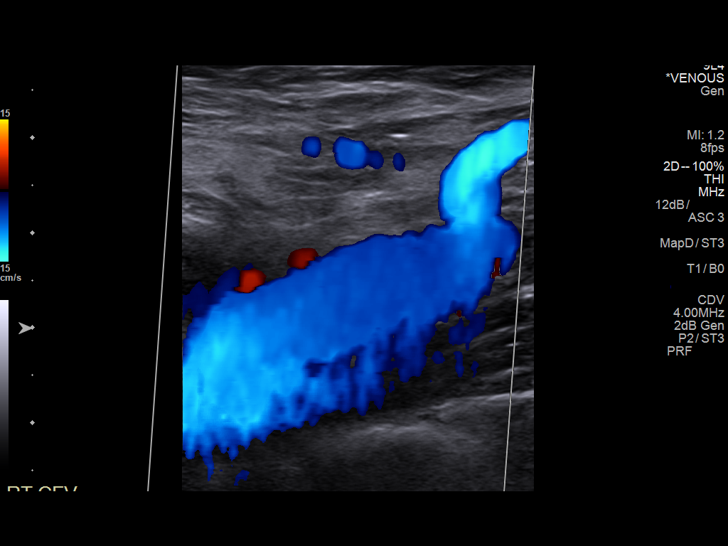
[im 5/29]
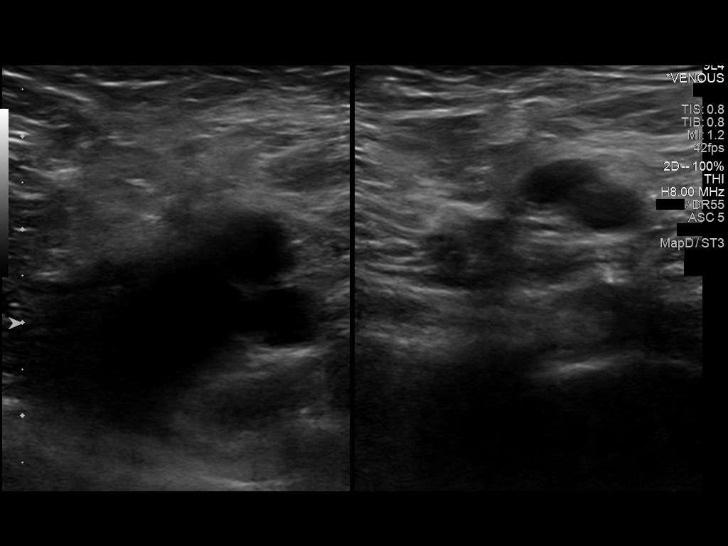
[im 8/29]
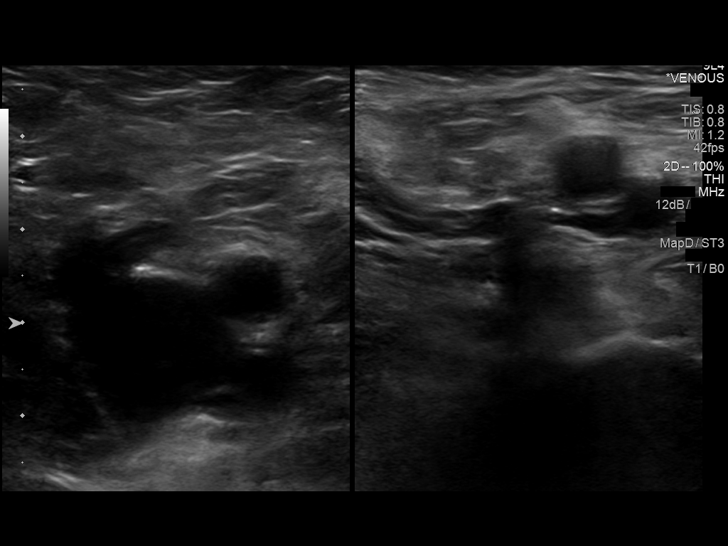
[im 10/29]
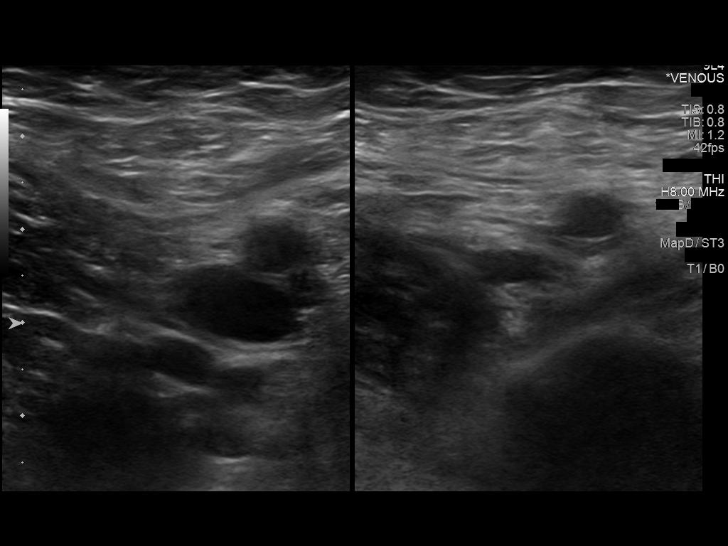
[im 13/29]
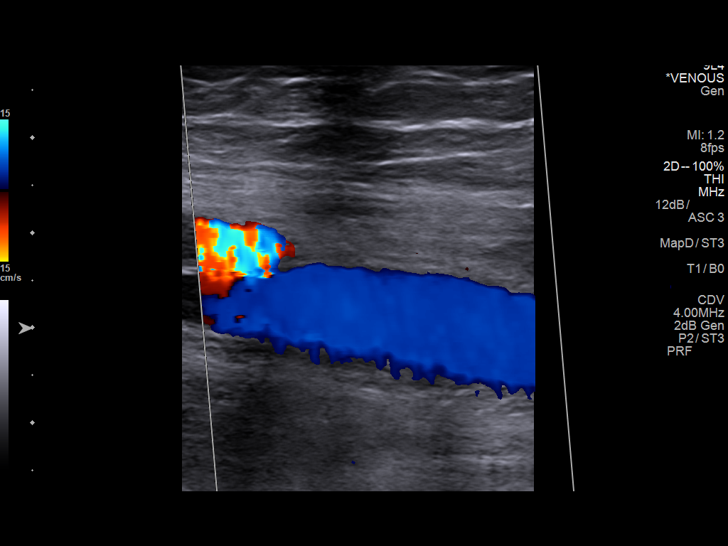
[im 16/29]
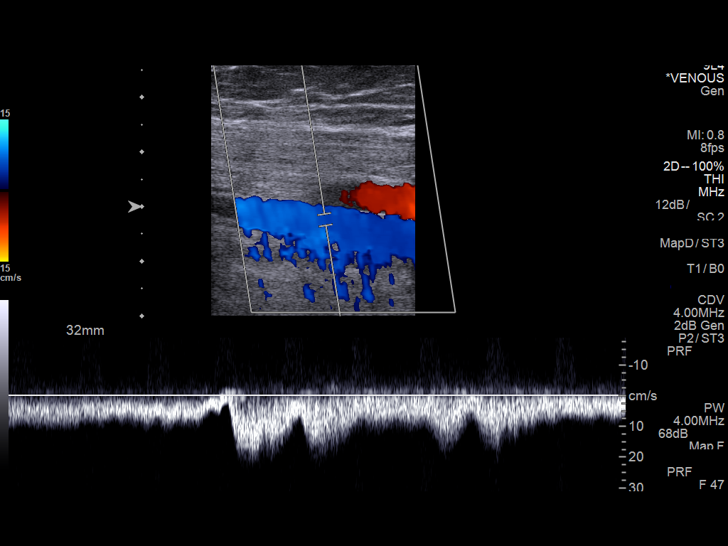
[im 18/29]
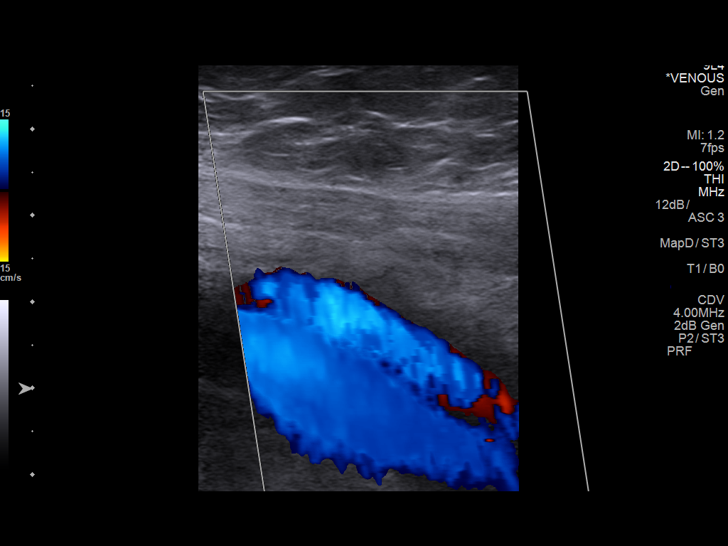
[im 20/29]
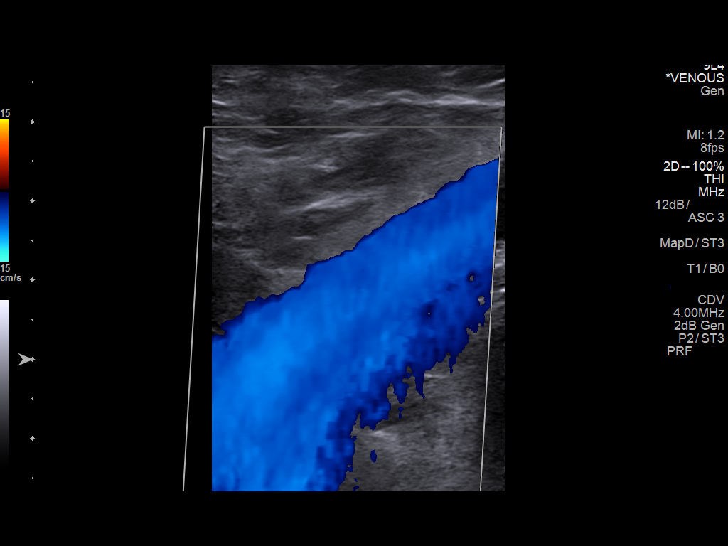
[im 22/29]
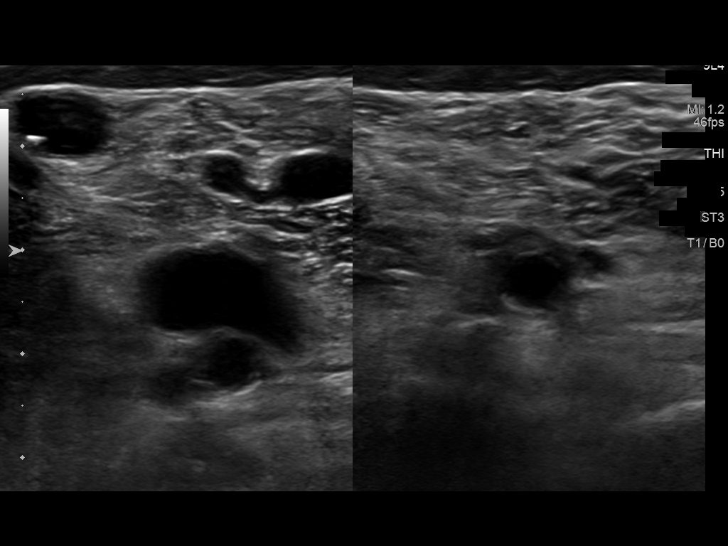
[im 25/29]
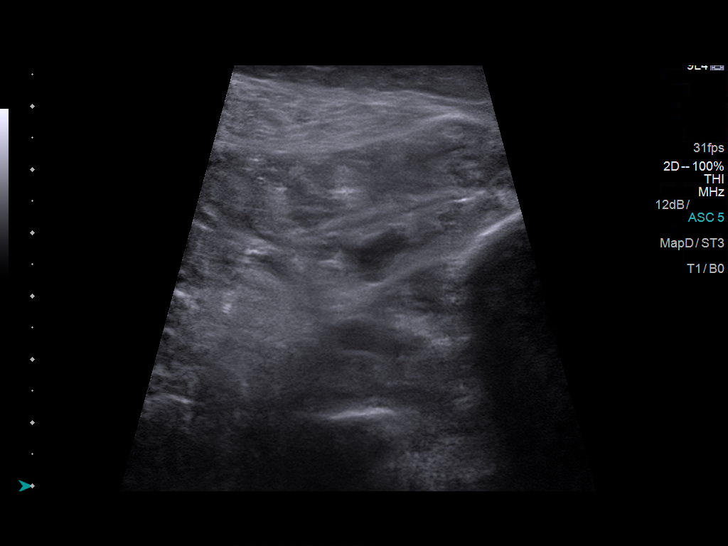
[im 26/29]
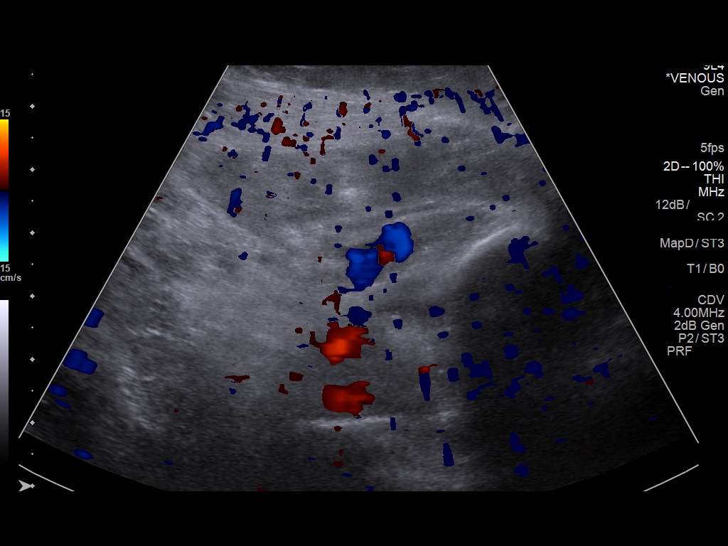
[im 29/29]
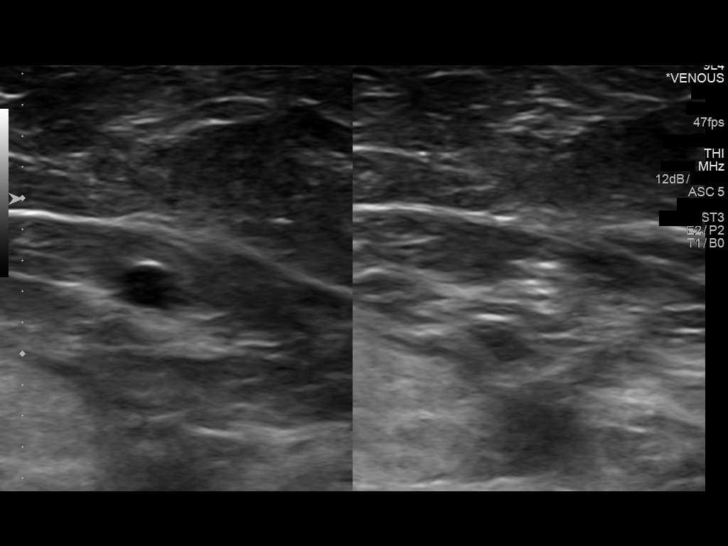

[13 of 24 positions shown; findings below may reference images not displayed]

FINDINGS: Contralateral Common Femoral Vein: Respiratory phasicity is normal
and symmetric with the symptomatic side. No evidence of thrombus.
Normal compressibility.

Common Femoral Vein: No evidence of thrombus. Normal
compressibility, respiratory phasicity and response to augmentation.

Saphenofemoral Junction: No evidence of thrombus. Normal
compressibility and flow on color Doppler imaging.

Profunda Femoral Vein: No evidence of thrombus. Normal
compressibility and flow on color Doppler imaging.

Femoral Vein: No evidence of thrombus. Normal compressibility,
respiratory phasicity and response to augmentation.

Popliteal Vein: No evidence of thrombus. Normal compressibility,
respiratory phasicity and response to augmentation.

Calf Veins: No evidence of thrombus. Normal compressibility and flow
on color Doppler imaging.

Superficial Great Saphenous Vein: No evidence of thrombus. Normal
compressibility and flow on color Doppler imaging.

Venous Reflux:  None.

Other Findings:  Soft tissue edema is noted about the ankle.
IMPRESSION: No evidence of deep venous thrombosis.

## 2016-12-12 DIAGNOSIS — G20 Parkinson's disease: Secondary | ICD-10-CM | POA: Diagnosis not present

## 2016-12-12 DIAGNOSIS — J159 Unspecified bacterial pneumonia: Secondary | ICD-10-CM | POA: Diagnosis not present

## 2016-12-12 DIAGNOSIS — M069 Rheumatoid arthritis, unspecified: Secondary | ICD-10-CM | POA: Diagnosis not present

## 2016-12-12 DIAGNOSIS — R490 Dysphonia: Secondary | ICD-10-CM | POA: Diagnosis not present

## 2016-12-12 DIAGNOSIS — R1310 Dysphagia, unspecified: Secondary | ICD-10-CM | POA: Diagnosis not present

## 2016-12-12 DIAGNOSIS — I129 Hypertensive chronic kidney disease with stage 1 through stage 4 chronic kidney disease, or unspecified chronic kidney disease: Secondary | ICD-10-CM | POA: Diagnosis not present

## 2016-12-14 DIAGNOSIS — R490 Dysphonia: Secondary | ICD-10-CM | POA: Diagnosis not present

## 2016-12-14 DIAGNOSIS — J159 Unspecified bacterial pneumonia: Secondary | ICD-10-CM | POA: Diagnosis not present

## 2016-12-14 DIAGNOSIS — M069 Rheumatoid arthritis, unspecified: Secondary | ICD-10-CM | POA: Diagnosis not present

## 2016-12-14 DIAGNOSIS — I129 Hypertensive chronic kidney disease with stage 1 through stage 4 chronic kidney disease, or unspecified chronic kidney disease: Secondary | ICD-10-CM | POA: Diagnosis not present

## 2016-12-14 DIAGNOSIS — G20 Parkinson's disease: Secondary | ICD-10-CM | POA: Diagnosis not present

## 2016-12-14 DIAGNOSIS — R1310 Dysphagia, unspecified: Secondary | ICD-10-CM | POA: Diagnosis not present

## 2016-12-16 ENCOUNTER — Other Ambulatory Visit: Payer: Self-pay | Admitting: Osteopathic Medicine

## 2016-12-18 DIAGNOSIS — R490 Dysphonia: Secondary | ICD-10-CM | POA: Diagnosis not present

## 2016-12-18 DIAGNOSIS — J159 Unspecified bacterial pneumonia: Secondary | ICD-10-CM | POA: Diagnosis not present

## 2016-12-18 DIAGNOSIS — I129 Hypertensive chronic kidney disease with stage 1 through stage 4 chronic kidney disease, or unspecified chronic kidney disease: Secondary | ICD-10-CM | POA: Diagnosis not present

## 2016-12-18 DIAGNOSIS — G20 Parkinson's disease: Secondary | ICD-10-CM | POA: Diagnosis not present

## 2016-12-18 DIAGNOSIS — R1310 Dysphagia, unspecified: Secondary | ICD-10-CM | POA: Diagnosis not present

## 2016-12-18 DIAGNOSIS — M069 Rheumatoid arthritis, unspecified: Secondary | ICD-10-CM | POA: Diagnosis not present

## 2016-12-21 DIAGNOSIS — R490 Dysphonia: Secondary | ICD-10-CM | POA: Diagnosis not present

## 2016-12-21 DIAGNOSIS — J159 Unspecified bacterial pneumonia: Secondary | ICD-10-CM | POA: Diagnosis not present

## 2016-12-21 DIAGNOSIS — R1310 Dysphagia, unspecified: Secondary | ICD-10-CM | POA: Diagnosis not present

## 2016-12-21 DIAGNOSIS — M069 Rheumatoid arthritis, unspecified: Secondary | ICD-10-CM | POA: Diagnosis not present

## 2016-12-21 DIAGNOSIS — I129 Hypertensive chronic kidney disease with stage 1 through stage 4 chronic kidney disease, or unspecified chronic kidney disease: Secondary | ICD-10-CM | POA: Diagnosis not present

## 2016-12-21 DIAGNOSIS — G20 Parkinson's disease: Secondary | ICD-10-CM | POA: Diagnosis not present

## 2016-12-23 DIAGNOSIS — I129 Hypertensive chronic kidney disease with stage 1 through stage 4 chronic kidney disease, or unspecified chronic kidney disease: Secondary | ICD-10-CM | POA: Diagnosis not present

## 2016-12-23 DIAGNOSIS — R490 Dysphonia: Secondary | ICD-10-CM | POA: Diagnosis not present

## 2016-12-23 DIAGNOSIS — M069 Rheumatoid arthritis, unspecified: Secondary | ICD-10-CM | POA: Diagnosis not present

## 2016-12-23 DIAGNOSIS — N183 Chronic kidney disease, stage 3 (moderate): Secondary | ICD-10-CM | POA: Diagnosis not present

## 2016-12-23 DIAGNOSIS — G20 Parkinson's disease: Secondary | ICD-10-CM | POA: Diagnosis not present

## 2016-12-23 DIAGNOSIS — R1310 Dysphagia, unspecified: Secondary | ICD-10-CM | POA: Diagnosis not present

## 2016-12-28 DIAGNOSIS — R1310 Dysphagia, unspecified: Secondary | ICD-10-CM | POA: Diagnosis not present

## 2016-12-28 DIAGNOSIS — I129 Hypertensive chronic kidney disease with stage 1 through stage 4 chronic kidney disease, or unspecified chronic kidney disease: Secondary | ICD-10-CM | POA: Diagnosis not present

## 2016-12-28 DIAGNOSIS — R490 Dysphonia: Secondary | ICD-10-CM | POA: Diagnosis not present

## 2016-12-28 DIAGNOSIS — M069 Rheumatoid arthritis, unspecified: Secondary | ICD-10-CM | POA: Diagnosis not present

## 2016-12-28 DIAGNOSIS — N183 Chronic kidney disease, stage 3 (moderate): Secondary | ICD-10-CM | POA: Diagnosis not present

## 2016-12-28 DIAGNOSIS — G20 Parkinson's disease: Secondary | ICD-10-CM | POA: Diagnosis not present

## 2017-01-01 DIAGNOSIS — I10 Essential (primary) hypertension: Secondary | ICD-10-CM | POA: Diagnosis not present

## 2017-01-01 DIAGNOSIS — Z79899 Other long term (current) drug therapy: Secondary | ICD-10-CM | POA: Diagnosis not present

## 2017-01-01 DIAGNOSIS — M069 Rheumatoid arthritis, unspecified: Secondary | ICD-10-CM | POA: Diagnosis not present

## 2017-01-02 DIAGNOSIS — R490 Dysphonia: Secondary | ICD-10-CM | POA: Diagnosis not present

## 2017-01-02 DIAGNOSIS — N183 Chronic kidney disease, stage 3 (moderate): Secondary | ICD-10-CM | POA: Diagnosis not present

## 2017-01-02 DIAGNOSIS — Z1231 Encounter for screening mammogram for malignant neoplasm of breast: Secondary | ICD-10-CM | POA: Diagnosis not present

## 2017-01-02 DIAGNOSIS — M069 Rheumatoid arthritis, unspecified: Secondary | ICD-10-CM | POA: Diagnosis not present

## 2017-01-02 DIAGNOSIS — I129 Hypertensive chronic kidney disease with stage 1 through stage 4 chronic kidney disease, or unspecified chronic kidney disease: Secondary | ICD-10-CM | POA: Diagnosis not present

## 2017-01-02 DIAGNOSIS — G20 Parkinson's disease: Secondary | ICD-10-CM | POA: Diagnosis not present

## 2017-01-02 DIAGNOSIS — R1310 Dysphagia, unspecified: Secondary | ICD-10-CM | POA: Diagnosis not present

## 2017-01-02 LAB — HM MAMMOGRAPHY

## 2017-01-04 DIAGNOSIS — E785 Hyperlipidemia, unspecified: Secondary | ICD-10-CM | POA: Diagnosis not present

## 2017-01-04 DIAGNOSIS — Z885 Allergy status to narcotic agent status: Secondary | ICD-10-CM | POA: Diagnosis not present

## 2017-01-04 DIAGNOSIS — Z9089 Acquired absence of other organs: Secondary | ICD-10-CM | POA: Diagnosis not present

## 2017-01-04 DIAGNOSIS — E039 Hypothyroidism, unspecified: Secondary | ICD-10-CM | POA: Diagnosis not present

## 2017-01-04 DIAGNOSIS — G20 Parkinson's disease: Secondary | ICD-10-CM | POA: Diagnosis not present

## 2017-01-04 DIAGNOSIS — Z79899 Other long term (current) drug therapy: Secondary | ICD-10-CM | POA: Diagnosis not present

## 2017-01-04 DIAGNOSIS — Z9689 Presence of other specified functional implants: Secondary | ICD-10-CM | POA: Diagnosis not present

## 2017-01-08 ENCOUNTER — Encounter: Payer: Self-pay | Admitting: Osteopathic Medicine

## 2017-01-09 DIAGNOSIS — N183 Chronic kidney disease, stage 3 (moderate): Secondary | ICD-10-CM | POA: Diagnosis not present

## 2017-01-09 DIAGNOSIS — M069 Rheumatoid arthritis, unspecified: Secondary | ICD-10-CM | POA: Diagnosis not present

## 2017-01-09 DIAGNOSIS — R490 Dysphonia: Secondary | ICD-10-CM | POA: Diagnosis not present

## 2017-01-09 DIAGNOSIS — G20 Parkinson's disease: Secondary | ICD-10-CM | POA: Diagnosis not present

## 2017-01-09 DIAGNOSIS — R1310 Dysphagia, unspecified: Secondary | ICD-10-CM | POA: Diagnosis not present

## 2017-01-09 DIAGNOSIS — I129 Hypertensive chronic kidney disease with stage 1 through stage 4 chronic kidney disease, or unspecified chronic kidney disease: Secondary | ICD-10-CM | POA: Diagnosis not present

## 2017-01-11 DIAGNOSIS — R1310 Dysphagia, unspecified: Secondary | ICD-10-CM | POA: Diagnosis not present

## 2017-01-11 DIAGNOSIS — M069 Rheumatoid arthritis, unspecified: Secondary | ICD-10-CM | POA: Diagnosis not present

## 2017-01-11 DIAGNOSIS — N183 Chronic kidney disease, stage 3 (moderate): Secondary | ICD-10-CM | POA: Diagnosis not present

## 2017-01-11 DIAGNOSIS — R490 Dysphonia: Secondary | ICD-10-CM | POA: Diagnosis not present

## 2017-01-11 DIAGNOSIS — I129 Hypertensive chronic kidney disease with stage 1 through stage 4 chronic kidney disease, or unspecified chronic kidney disease: Secondary | ICD-10-CM | POA: Diagnosis not present

## 2017-01-11 DIAGNOSIS — G20 Parkinson's disease: Secondary | ICD-10-CM | POA: Diagnosis not present

## 2017-01-16 DIAGNOSIS — M069 Rheumatoid arthritis, unspecified: Secondary | ICD-10-CM | POA: Diagnosis not present

## 2017-01-16 DIAGNOSIS — R1310 Dysphagia, unspecified: Secondary | ICD-10-CM | POA: Diagnosis not present

## 2017-01-16 DIAGNOSIS — I129 Hypertensive chronic kidney disease with stage 1 through stage 4 chronic kidney disease, or unspecified chronic kidney disease: Secondary | ICD-10-CM | POA: Diagnosis not present

## 2017-01-16 DIAGNOSIS — G20 Parkinson's disease: Secondary | ICD-10-CM | POA: Diagnosis not present

## 2017-01-16 DIAGNOSIS — N183 Chronic kidney disease, stage 3 (moderate): Secondary | ICD-10-CM | POA: Diagnosis not present

## 2017-01-16 DIAGNOSIS — R490 Dysphonia: Secondary | ICD-10-CM | POA: Diagnosis not present

## 2017-01-18 DIAGNOSIS — I129 Hypertensive chronic kidney disease with stage 1 through stage 4 chronic kidney disease, or unspecified chronic kidney disease: Secondary | ICD-10-CM | POA: Diagnosis not present

## 2017-01-18 DIAGNOSIS — G20 Parkinson's disease: Secondary | ICD-10-CM | POA: Diagnosis not present

## 2017-01-18 DIAGNOSIS — R490 Dysphonia: Secondary | ICD-10-CM | POA: Diagnosis not present

## 2017-01-18 DIAGNOSIS — M069 Rheumatoid arthritis, unspecified: Secondary | ICD-10-CM | POA: Diagnosis not present

## 2017-01-18 DIAGNOSIS — N183 Chronic kidney disease, stage 3 (moderate): Secondary | ICD-10-CM | POA: Diagnosis not present

## 2017-01-18 DIAGNOSIS — R1310 Dysphagia, unspecified: Secondary | ICD-10-CM | POA: Diagnosis not present

## 2017-01-26 DIAGNOSIS — R1310 Dysphagia, unspecified: Secondary | ICD-10-CM | POA: Diagnosis not present

## 2017-01-26 DIAGNOSIS — N183 Chronic kidney disease, stage 3 (moderate): Secondary | ICD-10-CM | POA: Diagnosis not present

## 2017-01-26 DIAGNOSIS — I129 Hypertensive chronic kidney disease with stage 1 through stage 4 chronic kidney disease, or unspecified chronic kidney disease: Secondary | ICD-10-CM | POA: Diagnosis not present

## 2017-01-26 DIAGNOSIS — M069 Rheumatoid arthritis, unspecified: Secondary | ICD-10-CM | POA: Diagnosis not present

## 2017-01-26 DIAGNOSIS — G20 Parkinson's disease: Secondary | ICD-10-CM | POA: Diagnosis not present

## 2017-01-26 DIAGNOSIS — R490 Dysphonia: Secondary | ICD-10-CM | POA: Diagnosis not present

## 2017-01-30 DIAGNOSIS — I129 Hypertensive chronic kidney disease with stage 1 through stage 4 chronic kidney disease, or unspecified chronic kidney disease: Secondary | ICD-10-CM | POA: Diagnosis not present

## 2017-01-30 DIAGNOSIS — G20 Parkinson's disease: Secondary | ICD-10-CM | POA: Diagnosis not present

## 2017-01-30 DIAGNOSIS — R1310 Dysphagia, unspecified: Secondary | ICD-10-CM | POA: Diagnosis not present

## 2017-01-30 DIAGNOSIS — R490 Dysphonia: Secondary | ICD-10-CM | POA: Diagnosis not present

## 2017-01-30 DIAGNOSIS — N183 Chronic kidney disease, stage 3 (moderate): Secondary | ICD-10-CM | POA: Diagnosis not present

## 2017-01-30 DIAGNOSIS — M069 Rheumatoid arthritis, unspecified: Secondary | ICD-10-CM | POA: Diagnosis not present

## 2017-01-31 ENCOUNTER — Telehealth: Payer: Self-pay

## 2017-01-31 NOTE — Telephone Encounter (Signed)
Mateo Flow from [facility] called to advise that patient has had several falls at 4-5 over the past few weeks. She stated that patient did not have any injuries. Please advise. Marland Kitchen Cynithia Hakimi,CMA

## 2017-01-31 NOTE — Telephone Encounter (Signed)
Patient does have frequent falls, I have talked to her and her daughter repeatedly about her living situation and mobility issues.   If there is any concern for injury, the patient should be seen.   If they would like me to try and get her an electric wheelchair, have home health instruct [place] on the process for getting something like this, I can write a prescription but there is usually other paperwork that needs to be completed.

## 2017-02-01 DIAGNOSIS — R928 Other abnormal and inconclusive findings on diagnostic imaging of breast: Secondary | ICD-10-CM | POA: Diagnosis not present

## 2017-02-01 DIAGNOSIS — N6489 Other specified disorders of breast: Secondary | ICD-10-CM | POA: Diagnosis not present

## 2017-02-01 NOTE — Telephone Encounter (Signed)
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 Rhonda Cunningham,CMA

## 2017-02-01 NOTE — Telephone Encounter (Signed)
Called Mateo Flow back @ [phone] and left a detailed message. Advised her to call me back regarding the message. Tareva Leske,CMA

## 2017-02-06 DIAGNOSIS — R1310 Dysphagia, unspecified: Secondary | ICD-10-CM | POA: Diagnosis not present

## 2017-02-06 DIAGNOSIS — R490 Dysphonia: Secondary | ICD-10-CM | POA: Diagnosis not present

## 2017-02-06 DIAGNOSIS — N183 Chronic kidney disease, stage 3 (moderate): Secondary | ICD-10-CM | POA: Diagnosis not present

## 2017-02-06 DIAGNOSIS — I129 Hypertensive chronic kidney disease with stage 1 through stage 4 chronic kidney disease, or unspecified chronic kidney disease: Secondary | ICD-10-CM | POA: Diagnosis not present

## 2017-02-06 DIAGNOSIS — G20 Parkinson's disease: Secondary | ICD-10-CM | POA: Diagnosis not present

## 2017-02-06 DIAGNOSIS — M069 Rheumatoid arthritis, unspecified: Secondary | ICD-10-CM | POA: Diagnosis not present

## 2017-02-07 DIAGNOSIS — R490 Dysphonia: Secondary | ICD-10-CM | POA: Diagnosis not present

## 2017-02-07 DIAGNOSIS — G20 Parkinson's disease: Secondary | ICD-10-CM | POA: Diagnosis not present

## 2017-02-07 DIAGNOSIS — M069 Rheumatoid arthritis, unspecified: Secondary | ICD-10-CM | POA: Diagnosis not present

## 2017-02-07 DIAGNOSIS — I129 Hypertensive chronic kidney disease with stage 1 through stage 4 chronic kidney disease, or unspecified chronic kidney disease: Secondary | ICD-10-CM | POA: Diagnosis not present

## 2017-02-07 DIAGNOSIS — R1310 Dysphagia, unspecified: Secondary | ICD-10-CM | POA: Diagnosis not present

## 2017-02-07 DIAGNOSIS — N183 Chronic kidney disease, stage 3 (moderate): Secondary | ICD-10-CM | POA: Diagnosis not present

## 2017-02-13 ENCOUNTER — Encounter: Payer: Self-pay | Admitting: Osteopathic Medicine

## 2017-02-13 ENCOUNTER — Ambulatory Visit (INDEPENDENT_AMBULATORY_CARE_PROVIDER_SITE_OTHER): Payer: Medicare Other | Admitting: Osteopathic Medicine

## 2017-02-13 VITALS — BP 136/87 | HR 86 | Ht 60.0 in | Wt 182.0 lb

## 2017-02-13 DIAGNOSIS — R262 Difficulty in walking, not elsewhere classified: Secondary | ICD-10-CM | POA: Diagnosis not present

## 2017-02-13 DIAGNOSIS — M1993 Secondary osteoarthritis, unspecified site: Secondary | ICD-10-CM

## 2017-02-13 DIAGNOSIS — R350 Frequency of micturition: Secondary | ICD-10-CM | POA: Diagnosis not present

## 2017-02-13 DIAGNOSIS — G20 Parkinson's disease: Secondary | ICD-10-CM | POA: Diagnosis not present

## 2017-02-13 DIAGNOSIS — M069 Rheumatoid arthritis, unspecified: Secondary | ICD-10-CM | POA: Diagnosis not present

## 2017-02-13 LAB — POCT URINALYSIS DIPSTICK
Bilirubin, UA: NEGATIVE
GLUCOSE UA: NEGATIVE
KETONES UA: NEGATIVE
Nitrite, UA: NEGATIVE
Protein, UA: NEGATIVE
RBC UA: NEGATIVE
SPEC GRAV UA: 1.025 (ref 1.010–1.025)
UROBILINOGEN UA: 0.2 U/dL
pH, UA: 5.5 (ref 5.0–8.0)

## 2017-02-13 NOTE — Progress Notes (Signed)
HPI: Shannon Vincent is a 82 y.o. female  who presents to [facility] today, 02/13/17,  for chief complaint of:  Mobility Evaluation, face-to-face encounter.   Patient presents to office today to complete face-to-face evaluation to obtain power wheelchair. Challenges to mobility include: Parkinson's disease and Osteoarthritis and Rheumatoid arthritis and patient hopes that a power wheelchair will help with ambulating safely and confidently, helping to get around the house, out shopping, taking care of herself, transferring from bed/chair, chair/toilet and average 5/10 but sometimes worse chronic pain in L hand, L ankle, L hip and bilateral knees. Patient has had multiple falls at home.   Patient is accompanied by daughter and grandson who assists with history-taking.   Past medical history, surgical history, social history and family history reviewed.  Patient Active Problem List   Diagnosis Date Noted  . Zenker diverticula 11/13/2016  . Rheumatoid arthritis ([place]) 11/13/2016  . Depression with anxiety 11/13/2016  . Ambulatory dysfunction 05/03/2016  . Traumatic hematoma of forehead 03/31/2016  . Breast cancer screening 01/03/2016  . UTI (urinary tract infection) 10/29/2015  . Vitamin D deficiency 07/29/2015  . Absolute anemia 07/29/2015  . Thyroid activity decreased 07/29/2015  . Seasonal allergies 07/29/2015  . Postmenopausal 07/29/2015  . S/P surgical manipulation of ankle joint 07/29/2015  . OBESITY, UNSPECIFIED 12/02/2010  . FREQUENCY, URINARY 07/01/2010  . ESSENTIAL HYPERTENSION, BENIGN 04/01/2010  . HYPERLIPIDEMIA 02/22/2010  . ANXIETY STATE, UNSPECIFIED 02/22/2010  . Parkinson disease ([place]) 02/22/2010  . OSTEOARTHRITIS, MODERATE 02/22/2010    Current medication list and allergy/intolerance information reviewed.   Current Outpatient Prescriptions on File Prior to Visit  Medication Sig Dispense Refill  . acetaminophen (TYLENOL) 325 MG  tablet Take 2 tablets (650 mg total) by mouth every 6 (six) hours as needed for mild pain, moderate pain, fever or headache. 90 tablet 1  . amantadine (SYMMETREL) 100 MG capsule Take 1 capsule (100 mg total) by mouth 3 (three) times daily. 90 capsule 5  . AMBULATORY NON FORMULARY MEDICATION Shower Chair - Diagnosis - Parkinson's disease ICD-10 G20 1 each 0  . aspirin 81 MG EC tablet TAKE ONE TABLET BY MOUTH EVERY DAY 30 tablet 0  . Calcium Carbonate-Vitamin D 600-400 MG-UNIT chew tablet Chew 2 tablets by mouth daily. 60 tablet 12  . carbidopa-levodopa (SINEMET IR) 25-100 MG tablet TAKE TWO TABLETS BY MOUTH EVERY 3 HOURS. MAX OF 7 DOSES A DAY 420 tablet 5  . cetirizine (ZYRTEC) 5 MG tablet Take 1 tablet (5 mg total) by mouth daily as needed for allergies or rhinitis. 30 tablet 12  . escitalopram (LEXAPRO) 5 MG tablet Take 1 tablet (5 mg total) by mouth daily. 90 tablet 1  . fluticasone (FLONASE) 50 MCG/ACT nasal spray Place 2 sprays into both nostrils daily. 16 g 6  . folic acid (FOLVITE) 1 MG tablet TAKE ONE TABLET BY MOUTH EVERY DAY 30 tablet 0  . levothyroxine (SYNTHROID, LEVOTHROID) 100 MCG tablet TAKE ONE TABLET BY MOUTH EVERY DAY BEFORE BREAKFAST. Due to recheck labs 04/2017 90 tablet 1  . methotrexate (RHEUMATREX) 2.5 MG tablet Take 6 tablets (15 mg total) by mouth once a week. Caution:Chemotherapy. Protect from light. 72 tablet 1  . mirtazapine (REMERON) 15 MG tablet Take 1 tablet (15 mg total) by mouth at bedtime. 90 tablet 1  . pantoprazole (PROTONIX) 40 MG tablet Take 1 tablet (40 mg total) by mouth daily. 90 tablet 1  . pramipexole (MIRAPEX) 0.5 MG tablet Take 1 tablet (0.5  mg total) by mouth 5 (five) times daily. 150 tablet 5  . SENEXON-S 8.6-50 MG tablet TAKE 2 TABLETS BY MOUTH AT BEDTIME AS NEEDED FOR MILD CONSTIPATION 60 tablet 0  . valsartan (DIOVAN) 320 MG tablet Take 1 tablet (320 mg total) by mouth daily. 90 tablet 1  . vitamin B-12 (CYANOCOBALAMIN) 1000 MCG tablet Take one by  mouth every day 90 tablet 1  . [DISCONTINUED] potassium chloride SA (K-DUR,KLOR-CON) 20 MEQ tablet Take 1 tablet (20 mEq total) by mouth daily. NEED FOLLOW UP VISIT FOR MORE REFILLS 30 tablet 0   No current facility-administered medications on file prior to visit.    Allergies  Allergen Reactions  . Codeine Nausea And Vomiting  . Morphine Diarrhea and Nausea And Vomiting  . Adhesive [Tape] Rash  . Latex Rash      Review of Systems:  Constitutional: No recent illness, no fever  HEENT: No  headache, no vision change  Cardiac: No  chest pain, No  pressure, No palpitations  Respiratory:  No  shortness of breath. No  Cough  Gastrointestinal: No  abdominal pain  Musculoskeletal: No new myalgia/arthralgia  Skin: No  Rash  GU: urinary frequency  Neurologic: No change in strength/weakness, No  Dizziness  Psychiatric: No  concerns with depression, No  concerns with anxiety  Exam:  BP 136/87   Pulse 86   Ht 5' (1.524 m)   Wt 182 lb (82.6 kg)   BMI 35.54 kg/m   Constitutional: VS see above. General Appearance: alert, well-developed, well-nourished, NAD  Eyes: Normal lids and conjunctive, non-icteric sclera  Ears, Nose, Mouth, Throat: MMM, Normal external inspection ears/nares/mouth/lips/gums.  Neck: No masses, trachea midline.   Respiratory: Normal respiratory effort. no wheeze, no rhonchi, no rales  Cardiovascular: S1/S2 normal, no murmur, no rub/gallop auscultated. RRR.   Musculoskeletal: Gait unsteady, ambulating with walker with dome difficulty. Notable tremor bilateral hands. Mild ulnar deviation to hands. Grip strength 5/5 but painful, upper and lower extremity strength 4/5 bilaterally ad symmetric   Neurological: Impaired balance/coordination but no focal deficit. +tremor.  Skin: warm, dry, intact.   Psychiatric: Normal judgment/insight. Normal mood and affect. Oriented x3.   Labs reviewed through [facility]: 01/01/17 K normal, Cr stable  Results for orders  placed or performed in visit on 02/13/17 (from the past 24 hour(s))  POCT Urinalysis Dipstick     Status: Abnormal   Collection Time: 02/13/17  2:20 PM  Result Value Ref Range   Color, UA YELLOW    Clarity, UA CLEAR    Glucose, UA NEGATIVE    Bilirubin, UA NEGATIVE    Ketones, UA NEGATIVE    Spec Grav, UA 1.025 1.010 - 1.025   Blood, UA NEGATIVE    pH, UA 5.5 5.0 - 8.0   Protein, UA NEGATIVE    Urobilinogen, UA 0.2 0.2 or 1.0 E.U./dL   Nitrite, UA NEGATIVE    Leukocytes, UA Small (1+) (A) Negative     ASSESSMENT/PLAN: The primary encounter diagnosis was Ambulatory dysfunction. Diagnoses of Parkinson disease ([place]), Rheumatoid arthritis, involving unspecified site, unspecified rheumatoid factor presence ([place]), Other secondary osteoarthritis, unspecified site, and Urinary frequency were also pertinent to this visit.    Mobility challenges include: Parkinson's, Rheumatoid arthritis, Osteoarthritis, unsteady gait and poor balance due to these issues resulting in multiple falls. See problem list for full details.   Power chair will assist with ADL such as cooking, cleaning, toileting by allowing the patient to ambulate safely while reducing the risk of falling.  Other ambulatory assistive devices such as cane, walker are not a good option due to rheumatoid arthritis and osteoarthritis causing hand/wrist pain, pain typically on a scale of 5/10. See physical exam for further assessment.   Manual wheelchair is not a good option due to upper extremity pain and tremor.   Scooter is not a good option due to patient's home unable to accomodate the wider turning radius of a scooter.    Patient has the mental capacity and willingness to operate power wheelchair, see physical exam for further assessment.     Urinary frequency - relatively unremarkable UA, pt would liek to await Cx results before starting abx and I would agree given relative lack of symptoms other than chronic frequency, if  worse will let me know   Follow-up plan: Return in about 6 months (around 08/16/2017) for ROUTINE PHYSICAL, SOONER IF NEEDED.  Visit summary with medication list and pertinent instructions was printed for patient to review, alert [place] if any changes needed. All questions at time of visit were answered - patient instructed to contact office with any additional concerns. ER/RTC precautions were reviewed with the patient and understanding verbalized.   Total time spent 25 minutes, greater than 50% of the visit was counseling and coordinating care for diagnosis of The primary encounter diagnosis was Ambulatory dysfunction. Diagnoses of Parkinson disease ([place]), Rheumatoid arthritis, involving unspecified site, unspecified rheumatoid factor presence ([place]), Other secondary osteoarthritis, unspecified site, and Urinary frequency were also pertinent to this visit.

## 2017-02-13 NOTE — Telephone Encounter (Signed)
Pt has apt today.

## 2017-02-14 DIAGNOSIS — G20 Parkinson's disease: Secondary | ICD-10-CM | POA: Diagnosis not present

## 2017-02-14 DIAGNOSIS — M069 Rheumatoid arthritis, unspecified: Secondary | ICD-10-CM | POA: Diagnosis not present

## 2017-02-14 DIAGNOSIS — I129 Hypertensive chronic kidney disease with stage 1 through stage 4 chronic kidney disease, or unspecified chronic kidney disease: Secondary | ICD-10-CM | POA: Diagnosis not present

## 2017-02-14 DIAGNOSIS — N183 Chronic kidney disease, stage 3 (moderate): Secondary | ICD-10-CM | POA: Diagnosis not present

## 2017-02-14 DIAGNOSIS — R490 Dysphonia: Secondary | ICD-10-CM | POA: Diagnosis not present

## 2017-02-14 DIAGNOSIS — R1310 Dysphagia, unspecified: Secondary | ICD-10-CM | POA: Diagnosis not present

## 2017-02-15 LAB — URINE CULTURE

## 2017-02-16 DIAGNOSIS — R1310 Dysphagia, unspecified: Secondary | ICD-10-CM | POA: Diagnosis not present

## 2017-02-16 DIAGNOSIS — I129 Hypertensive chronic kidney disease with stage 1 through stage 4 chronic kidney disease, or unspecified chronic kidney disease: Secondary | ICD-10-CM | POA: Diagnosis not present

## 2017-02-16 DIAGNOSIS — G20 Parkinson's disease: Secondary | ICD-10-CM | POA: Diagnosis not present

## 2017-02-16 DIAGNOSIS — R490 Dysphonia: Secondary | ICD-10-CM | POA: Diagnosis not present

## 2017-02-16 DIAGNOSIS — M069 Rheumatoid arthritis, unspecified: Secondary | ICD-10-CM | POA: Diagnosis not present

## 2017-02-16 DIAGNOSIS — N183 Chronic kidney disease, stage 3 (moderate): Secondary | ICD-10-CM | POA: Diagnosis not present

## 2017-02-20 DIAGNOSIS — M25512 Pain in left shoulder: Secondary | ICD-10-CM | POA: Diagnosis not present

## 2017-02-20 DIAGNOSIS — S63211A Subluxation of metacarpophalangeal joint of left index finger, initial encounter: Secondary | ICD-10-CM | POA: Diagnosis not present

## 2017-02-20 DIAGNOSIS — S63212A Subluxation of metacarpophalangeal joint of right middle finger, initial encounter: Secondary | ICD-10-CM | POA: Diagnosis not present

## 2017-02-20 DIAGNOSIS — M06841 Other specified rheumatoid arthritis, right hand: Secondary | ICD-10-CM | POA: Diagnosis not present

## 2017-02-20 DIAGNOSIS — R490 Dysphonia: Secondary | ICD-10-CM | POA: Diagnosis not present

## 2017-02-20 DIAGNOSIS — I129 Hypertensive chronic kidney disease with stage 1 through stage 4 chronic kidney disease, or unspecified chronic kidney disease: Secondary | ICD-10-CM | POA: Diagnosis not present

## 2017-02-20 DIAGNOSIS — G20 Parkinson's disease: Secondary | ICD-10-CM | POA: Diagnosis not present

## 2017-02-20 DIAGNOSIS — N183 Chronic kidney disease, stage 3 (moderate): Secondary | ICD-10-CM | POA: Diagnosis not present

## 2017-02-20 DIAGNOSIS — R1310 Dysphagia, unspecified: Secondary | ICD-10-CM | POA: Diagnosis not present

## 2017-02-20 DIAGNOSIS — M069 Rheumatoid arthritis, unspecified: Secondary | ICD-10-CM | POA: Diagnosis not present

## 2017-02-20 DIAGNOSIS — M7582 Other shoulder lesions, left shoulder: Secondary | ICD-10-CM | POA: Diagnosis not present

## 2017-02-20 DIAGNOSIS — S63217A Subluxation of metacarpophalangeal joint of left little finger, initial encounter: Secondary | ICD-10-CM | POA: Diagnosis not present

## 2017-02-21 ENCOUNTER — Telehealth: Payer: Self-pay

## 2017-02-21 NOTE — Telephone Encounter (Signed)
Noted. Patient knows to seek medical care if pain or head trauma or any other concerns. Please call and check up on patient and see if she needs a visit.

## 2017-02-21 NOTE — Telephone Encounter (Signed)
ADVISE ONLY:Shannon Vincent called to report that Shannon Vincent had another fall again on Sunday, she is still waiting to receive her power chair and she no has life alert . Shannon Vincent,CMA

## 2017-02-21 NOTE — Telephone Encounter (Signed)
Called to check on Pt. She reports she fell in the kitchen. She states "nothing hurts" and that she is "just embarrassed." Pt states she did not head her hear, no loss of consciousness. Pt denied need for appt at this time. Advised to contact clinic if any pain occurs or if she has any changes, verbalized understanding.

## 2017-02-21 NOTE — Telephone Encounter (Signed)
Noted, thanks!

## 2017-03-02 ENCOUNTER — Telehealth: Payer: Self-pay | Admitting: Osteopathic Medicine

## 2017-03-02 NOTE — Telephone Encounter (Signed)
Received notification from Medicare that the power wheelchair was denied. I attempted to call at the number given on the fax, [phone], however voice prompts noted that staff was out for training and no one was available to answer call. We'll try back again on Monday when I'm back in the office.

## 2017-03-09 NOTE — Telephone Encounter (Signed)
I spoke with Medicare person at phone number below, they state that I don't have an NPI for durable medical equipment? The woman stated that this may be the reason that the power wheelchair was denied. Can we confirm what this might be about? If the incorrect NPI was submitted, can we please call them back at the number below to change this. I think it may need to be routed through prior authorizations?

## 2017-03-26 NOTE — Telephone Encounter (Signed)
Spoke with Pt, she did get her motorized wheelchair. No further questions.

## 2017-04-04 DIAGNOSIS — I10 Essential (primary) hypertension: Secondary | ICD-10-CM | POA: Diagnosis not present

## 2017-04-04 DIAGNOSIS — M0609 Rheumatoid arthritis without rheumatoid factor, multiple sites: Secondary | ICD-10-CM | POA: Diagnosis not present

## 2017-04-04 DIAGNOSIS — Z79899 Other long term (current) drug therapy: Secondary | ICD-10-CM | POA: Diagnosis not present

## 2017-04-04 DIAGNOSIS — M7582 Other shoulder lesions, left shoulder: Secondary | ICD-10-CM | POA: Diagnosis not present

## 2017-04-05 DIAGNOSIS — R490 Dysphonia: Secondary | ICD-10-CM | POA: Diagnosis not present

## 2017-04-05 DIAGNOSIS — K225 Diverticulum of esophagus, acquired: Secondary | ICD-10-CM | POA: Diagnosis not present

## 2017-04-10 ENCOUNTER — Other Ambulatory Visit: Payer: Self-pay | Admitting: Osteopathic Medicine

## 2017-04-10 MED ORDER — LOSARTAN POTASSIUM 100 MG PO TABS: 100.0000 mg | ORAL_TABLET | Freq: Every day | ORAL | 3 refills | Status: AC

## 2017-04-10 NOTE — Progress Notes (Signed)
Pharmacy was requesting alternative to valsartan, alternative medicine sent in, please have patient follow-up for blood pressure check or her home health can report to [place] what blood pressure is in 2 weeks. Please call patient and make her aware of plan

## 2017-04-11 NOTE — Progress Notes (Signed)
Patient notified

## 2017-04-18 DIAGNOSIS — M6282 Rhabdomyolysis: Secondary | ICD-10-CM | POA: Diagnosis not present

## 2017-04-18 DIAGNOSIS — E785 Hyperlipidemia, unspecified: Secondary | ICD-10-CM | POA: Diagnosis not present

## 2017-04-18 DIAGNOSIS — R41841 Cognitive communication deficit: Secondary | ICD-10-CM | POA: Diagnosis not present

## 2017-04-18 DIAGNOSIS — I129 Hypertensive chronic kidney disease with stage 1 through stage 4 chronic kidney disease, or unspecified chronic kidney disease: Secondary | ICD-10-CM | POA: Diagnosis present

## 2017-04-18 DIAGNOSIS — M47894 Other spondylosis, thoracic region: Secondary | ICD-10-CM | POA: Diagnosis not present

## 2017-04-18 DIAGNOSIS — S79912A Unspecified injury of left hip, initial encounter: Secondary | ICD-10-CM | POA: Diagnosis not present

## 2017-04-18 DIAGNOSIS — Z9181 History of falling: Secondary | ICD-10-CM | POA: Diagnosis not present

## 2017-04-18 DIAGNOSIS — S4990XA Unspecified injury of shoulder and upper arm, unspecified arm, initial encounter: Secondary | ICD-10-CM | POA: Diagnosis not present

## 2017-04-18 DIAGNOSIS — M25512 Pain in left shoulder: Secondary | ICD-10-CM | POA: Diagnosis not present

## 2017-04-18 DIAGNOSIS — R531 Weakness: Secondary | ICD-10-CM | POA: Diagnosis not present

## 2017-04-18 DIAGNOSIS — R490 Dysphonia: Secondary | ICD-10-CM | POA: Diagnosis present

## 2017-04-18 DIAGNOSIS — I10 Essential (primary) hypertension: Secondary | ICD-10-CM | POA: Diagnosis not present

## 2017-04-18 DIAGNOSIS — G20 Parkinson's disease: Secondary | ICD-10-CM | POA: Diagnosis not present

## 2017-04-18 DIAGNOSIS — Z8739 Personal history of other diseases of the musculoskeletal system and connective tissue: Secondary | ICD-10-CM | POA: Diagnosis not present

## 2017-04-18 DIAGNOSIS — S43005D Unspecified dislocation of left shoulder joint, subsequent encounter: Secondary | ICD-10-CM | POA: Diagnosis not present

## 2017-04-18 DIAGNOSIS — E039 Hypothyroidism, unspecified: Secondary | ICD-10-CM | POA: Diagnosis not present

## 2017-04-18 DIAGNOSIS — S43005A Unspecified dislocation of left shoulder joint, initial encounter: Secondary | ICD-10-CM | POA: Diagnosis not present

## 2017-04-18 DIAGNOSIS — S79911A Unspecified injury of right hip, initial encounter: Secondary | ICD-10-CM | POA: Diagnosis not present

## 2017-04-18 DIAGNOSIS — I700 Atherosclerosis of aorta: Secondary | ICD-10-CM | POA: Diagnosis not present

## 2017-04-18 DIAGNOSIS — N183 Chronic kidney disease, stage 3 (moderate): Secondary | ICD-10-CM | POA: Diagnosis not present

## 2017-04-18 DIAGNOSIS — M5137 Other intervertebral disc degeneration, lumbosacral region: Secondary | ICD-10-CM | POA: Diagnosis not present

## 2017-04-18 DIAGNOSIS — F419 Anxiety disorder, unspecified: Secondary | ICD-10-CM | POA: Diagnosis present

## 2017-04-18 DIAGNOSIS — R930 Abnormal findings on diagnostic imaging of skull and head, not elsewhere classified: Secondary | ICD-10-CM | POA: Diagnosis not present

## 2017-04-18 DIAGNOSIS — W19XXXD Unspecified fall, subsequent encounter: Secondary | ICD-10-CM | POA: Diagnosis not present

## 2017-04-18 DIAGNOSIS — R296 Repeated falls: Secondary | ICD-10-CM | POA: Diagnosis not present

## 2017-04-18 DIAGNOSIS — Z96652 Presence of left artificial knee joint: Secondary | ICD-10-CM | POA: Diagnosis present

## 2017-04-18 DIAGNOSIS — S43015A Anterior dislocation of left humerus, initial encounter: Secondary | ICD-10-CM | POA: Diagnosis not present

## 2017-04-18 DIAGNOSIS — K219 Gastro-esophageal reflux disease without esophagitis: Secondary | ICD-10-CM | POA: Diagnosis present

## 2017-04-18 DIAGNOSIS — M069 Rheumatoid arthritis, unspecified: Secondary | ICD-10-CM | POA: Diagnosis not present

## 2017-04-18 DIAGNOSIS — E669 Obesity, unspecified: Secondary | ICD-10-CM | POA: Diagnosis not present

## 2017-04-18 DIAGNOSIS — T796XXD Traumatic ischemia of muscle, subsequent encounter: Secondary | ICD-10-CM | POA: Diagnosis not present

## 2017-04-18 DIAGNOSIS — M5134 Other intervertebral disc degeneration, thoracic region: Secondary | ICD-10-CM | POA: Diagnosis not present

## 2017-04-18 DIAGNOSIS — M0609 Rheumatoid arthritis without rheumatoid factor, multiple sites: Secondary | ICD-10-CM | POA: Diagnosis not present

## 2017-04-18 DIAGNOSIS — M5033 Other cervical disc degeneration, cervicothoracic region: Secondary | ICD-10-CM | POA: Diagnosis not present

## 2017-04-18 DIAGNOSIS — M47896 Other spondylosis, lumbar region: Secondary | ICD-10-CM | POA: Diagnosis not present

## 2017-04-18 DIAGNOSIS — Z6834 Body mass index (BMI) 34.0-34.9, adult: Secondary | ICD-10-CM | POA: Diagnosis not present

## 2017-04-18 DIAGNOSIS — S4992XA Unspecified injury of left shoulder and upper arm, initial encounter: Secondary | ICD-10-CM | POA: Diagnosis not present

## 2017-04-18 DIAGNOSIS — R1310 Dysphagia, unspecified: Secondary | ICD-10-CM | POA: Diagnosis not present

## 2017-04-18 DIAGNOSIS — Z969 Presence of functional implant, unspecified: Secondary | ICD-10-CM | POA: Diagnosis not present

## 2017-04-18 DIAGNOSIS — Z981 Arthrodesis status: Secondary | ICD-10-CM | POA: Diagnosis not present

## 2017-04-18 DIAGNOSIS — N179 Acute kidney failure, unspecified: Secondary | ICD-10-CM | POA: Diagnosis not present

## 2017-04-18 DIAGNOSIS — E6609 Other obesity due to excess calories: Secondary | ICD-10-CM | POA: Diagnosis present

## 2017-04-18 DIAGNOSIS — S0003XA Contusion of scalp, initial encounter: Secondary | ICD-10-CM | POA: Diagnosis not present

## 2017-04-18 DIAGNOSIS — R1312 Dysphagia, oropharyngeal phase: Secondary | ICD-10-CM | POA: Diagnosis not present

## 2017-04-18 DIAGNOSIS — M4317 Spondylolisthesis, lumbosacral region: Secondary | ICD-10-CM | POA: Diagnosis not present

## 2017-04-18 DIAGNOSIS — M25551 Pain in right hip: Secondary | ICD-10-CM | POA: Diagnosis not present

## 2017-04-18 DIAGNOSIS — W19XXXA Unspecified fall, initial encounter: Secondary | ICD-10-CM | POA: Diagnosis not present

## 2017-04-18 DIAGNOSIS — M25552 Pain in left hip: Secondary | ICD-10-CM | POA: Diagnosis not present

## 2017-04-18 DIAGNOSIS — R52 Pain, unspecified: Secondary | ICD-10-CM | POA: Diagnosis not present

## 2017-04-18 DIAGNOSIS — M47892 Other spondylosis, cervical region: Secondary | ICD-10-CM | POA: Diagnosis not present

## 2017-04-18 DIAGNOSIS — T796XXA Traumatic ischemia of muscle, initial encounter: Secondary | ICD-10-CM | POA: Diagnosis not present

## 2017-04-18 DIAGNOSIS — M546 Pain in thoracic spine: Secondary | ICD-10-CM | POA: Diagnosis not present

## 2017-04-18 DIAGNOSIS — E782 Mixed hyperlipidemia: Secondary | ICD-10-CM | POA: Diagnosis not present

## 2017-04-18 DIAGNOSIS — I672 Cerebral atherosclerosis: Secondary | ICD-10-CM | POA: Diagnosis not present

## 2017-04-18 DIAGNOSIS — F329 Major depressive disorder, single episode, unspecified: Secondary | ICD-10-CM | POA: Diagnosis present

## 2017-04-18 DIAGNOSIS — S80212A Abrasion, left knee, initial encounter: Secondary | ICD-10-CM | POA: Diagnosis not present

## 2017-04-18 DIAGNOSIS — F418 Other specified anxiety disorders: Secondary | ICD-10-CM | POA: Diagnosis not present

## 2017-04-18 DIAGNOSIS — M5136 Other intervertebral disc degeneration, lumbar region: Secondary | ICD-10-CM | POA: Diagnosis not present

## 2017-04-21 DIAGNOSIS — T796XXD Traumatic ischemia of muscle, subsequent encounter: Secondary | ICD-10-CM | POA: Diagnosis not present

## 2017-04-21 DIAGNOSIS — W19XXXD Unspecified fall, subsequent encounter: Secondary | ICD-10-CM | POA: Diagnosis not present

## 2017-04-21 DIAGNOSIS — I10 Essential (primary) hypertension: Secondary | ICD-10-CM | POA: Diagnosis not present

## 2017-04-21 DIAGNOSIS — R296 Repeated falls: Secondary | ICD-10-CM | POA: Diagnosis not present

## 2017-04-21 DIAGNOSIS — E039 Hypothyroidism, unspecified: Secondary | ICD-10-CM | POA: Diagnosis not present

## 2017-04-21 DIAGNOSIS — R52 Pain, unspecified: Secondary | ICD-10-CM | POA: Diagnosis not present

## 2017-04-21 DIAGNOSIS — N183 Chronic kidney disease, stage 3 (moderate): Secondary | ICD-10-CM | POA: Diagnosis not present

## 2017-04-21 DIAGNOSIS — N189 Chronic kidney disease, unspecified: Secondary | ICD-10-CM | POA: Diagnosis not present

## 2017-04-21 DIAGNOSIS — E785 Hyperlipidemia, unspecified: Secondary | ICD-10-CM | POA: Diagnosis not present

## 2017-04-21 DIAGNOSIS — Z96652 Presence of left artificial knee joint: Secondary | ICD-10-CM | POA: Diagnosis not present

## 2017-04-21 DIAGNOSIS — R41841 Cognitive communication deficit: Secondary | ICD-10-CM | POA: Diagnosis not present

## 2017-04-21 DIAGNOSIS — S43005D Unspecified dislocation of left shoulder joint, subsequent encounter: Secondary | ICD-10-CM | POA: Diagnosis not present

## 2017-04-21 DIAGNOSIS — M0609 Rheumatoid arthritis without rheumatoid factor, multiple sites: Secondary | ICD-10-CM | POA: Diagnosis not present

## 2017-04-21 DIAGNOSIS — F329 Major depressive disorder, single episode, unspecified: Secondary | ICD-10-CM | POA: Diagnosis not present

## 2017-04-21 DIAGNOSIS — G20 Parkinson's disease: Secondary | ICD-10-CM | POA: Diagnosis not present

## 2017-04-21 DIAGNOSIS — M6282 Rhabdomyolysis: Secondary | ICD-10-CM | POA: Diagnosis not present

## 2017-04-21 DIAGNOSIS — F419 Anxiety disorder, unspecified: Secondary | ICD-10-CM | POA: Diagnosis not present

## 2017-04-21 DIAGNOSIS — F418 Other specified anxiety disorders: Secondary | ICD-10-CM | POA: Diagnosis not present

## 2017-04-21 DIAGNOSIS — R1312 Dysphagia, oropharyngeal phase: Secondary | ICD-10-CM | POA: Diagnosis not present

## 2017-04-21 DIAGNOSIS — M069 Rheumatoid arthritis, unspecified: Secondary | ICD-10-CM | POA: Diagnosis not present

## 2017-04-21 DIAGNOSIS — R1310 Dysphagia, unspecified: Secondary | ICD-10-CM | POA: Diagnosis not present

## 2017-04-21 DIAGNOSIS — R531 Weakness: Secondary | ICD-10-CM | POA: Diagnosis not present

## 2017-04-21 DIAGNOSIS — I129 Hypertensive chronic kidney disease with stage 1 through stage 4 chronic kidney disease, or unspecified chronic kidney disease: Secondary | ICD-10-CM | POA: Diagnosis not present

## 2017-04-21 DIAGNOSIS — N390 Urinary tract infection, site not specified: Secondary | ICD-10-CM | POA: Diagnosis not present

## 2017-04-21 DIAGNOSIS — R634 Abnormal weight loss: Secondary | ICD-10-CM | POA: Diagnosis not present

## 2017-04-21 DIAGNOSIS — B964 Proteus (mirabilis) (morganii) as the cause of diseases classified elsewhere: Secondary | ICD-10-CM | POA: Diagnosis not present

## 2017-04-21 DIAGNOSIS — R269 Unspecified abnormalities of gait and mobility: Secondary | ICD-10-CM | POA: Diagnosis not present

## 2017-04-21 DIAGNOSIS — K219 Gastro-esophageal reflux disease without esophagitis: Secondary | ICD-10-CM | POA: Diagnosis not present

## 2017-04-21 DIAGNOSIS — E782 Mixed hyperlipidemia: Secondary | ICD-10-CM | POA: Diagnosis not present

## 2017-04-21 DIAGNOSIS — M25512 Pain in left shoulder: Secondary | ICD-10-CM | POA: Diagnosis not present

## 2017-04-21 DIAGNOSIS — Z9181 History of falling: Secondary | ICD-10-CM | POA: Diagnosis not present

## 2017-04-21 DIAGNOSIS — R300 Dysuria: Secondary | ICD-10-CM | POA: Diagnosis not present

## 2017-04-21 DIAGNOSIS — Z969 Presence of functional implant, unspecified: Secondary | ICD-10-CM | POA: Diagnosis not present

## 2017-04-21 DIAGNOSIS — G8929 Other chronic pain: Secondary | ICD-10-CM | POA: Diagnosis not present

## 2017-04-23 DIAGNOSIS — G20 Parkinson's disease: Secondary | ICD-10-CM | POA: Diagnosis not present

## 2017-04-23 DIAGNOSIS — I10 Essential (primary) hypertension: Secondary | ICD-10-CM | POA: Diagnosis not present

## 2017-04-23 DIAGNOSIS — M6282 Rhabdomyolysis: Secondary | ICD-10-CM | POA: Diagnosis not present

## 2017-04-23 DIAGNOSIS — R531 Weakness: Secondary | ICD-10-CM | POA: Diagnosis not present

## 2017-04-23 DIAGNOSIS — R296 Repeated falls: Secondary | ICD-10-CM | POA: Diagnosis not present

## 2017-04-23 DIAGNOSIS — R269 Unspecified abnormalities of gait and mobility: Secondary | ICD-10-CM | POA: Diagnosis not present

## 2017-04-24 DIAGNOSIS — R269 Unspecified abnormalities of gait and mobility: Secondary | ICD-10-CM | POA: Diagnosis not present

## 2017-04-24 DIAGNOSIS — R52 Pain, unspecified: Secondary | ICD-10-CM | POA: Diagnosis not present

## 2017-04-24 DIAGNOSIS — R531 Weakness: Secondary | ICD-10-CM | POA: Diagnosis not present

## 2017-04-24 DIAGNOSIS — G8929 Other chronic pain: Secondary | ICD-10-CM | POA: Diagnosis not present

## 2017-04-24 DIAGNOSIS — G20 Parkinson's disease: Secondary | ICD-10-CM | POA: Diagnosis not present

## 2017-04-25 DIAGNOSIS — K219 Gastro-esophageal reflux disease without esophagitis: Secondary | ICD-10-CM | POA: Diagnosis not present

## 2017-04-25 DIAGNOSIS — M6282 Rhabdomyolysis: Secondary | ICD-10-CM | POA: Diagnosis not present

## 2017-04-25 DIAGNOSIS — N189 Chronic kidney disease, unspecified: Secondary | ICD-10-CM | POA: Diagnosis not present

## 2017-04-25 DIAGNOSIS — I10 Essential (primary) hypertension: Secondary | ICD-10-CM | POA: Diagnosis not present

## 2017-04-25 DIAGNOSIS — E039 Hypothyroidism, unspecified: Secondary | ICD-10-CM | POA: Diagnosis not present

## 2017-04-25 DIAGNOSIS — G20 Parkinson's disease: Secondary | ICD-10-CM | POA: Diagnosis not present

## 2017-04-25 DIAGNOSIS — F329 Major depressive disorder, single episode, unspecified: Secondary | ICD-10-CM | POA: Diagnosis not present

## 2017-04-25 DIAGNOSIS — R1310 Dysphagia, unspecified: Secondary | ICD-10-CM | POA: Diagnosis not present

## 2017-04-25 DIAGNOSIS — M069 Rheumatoid arthritis, unspecified: Secondary | ICD-10-CM | POA: Diagnosis not present

## 2017-04-25 DIAGNOSIS — E785 Hyperlipidemia, unspecified: Secondary | ICD-10-CM | POA: Diagnosis not present

## 2017-05-01 DIAGNOSIS — R269 Unspecified abnormalities of gait and mobility: Secondary | ICD-10-CM | POA: Diagnosis not present

## 2017-05-01 DIAGNOSIS — Z9181 History of falling: Secondary | ICD-10-CM | POA: Diagnosis not present

## 2017-05-01 DIAGNOSIS — G20 Parkinson's disease: Secondary | ICD-10-CM | POA: Diagnosis not present

## 2017-05-01 DIAGNOSIS — R531 Weakness: Secondary | ICD-10-CM | POA: Diagnosis not present

## 2017-05-01 DIAGNOSIS — R300 Dysuria: Secondary | ICD-10-CM | POA: Diagnosis not present

## 2017-05-04 DIAGNOSIS — R269 Unspecified abnormalities of gait and mobility: Secondary | ICD-10-CM | POA: Diagnosis not present

## 2017-05-04 DIAGNOSIS — N390 Urinary tract infection, site not specified: Secondary | ICD-10-CM | POA: Diagnosis not present

## 2017-05-04 DIAGNOSIS — R531 Weakness: Secondary | ICD-10-CM | POA: Diagnosis not present

## 2017-05-04 DIAGNOSIS — G20 Parkinson's disease: Secondary | ICD-10-CM | POA: Diagnosis not present

## 2017-05-04 DIAGNOSIS — B964 Proteus (mirabilis) (morganii) as the cause of diseases classified elsewhere: Secondary | ICD-10-CM | POA: Diagnosis not present

## 2017-05-07 DIAGNOSIS — K219 Gastro-esophageal reflux disease without esophagitis: Secondary | ICD-10-CM | POA: Diagnosis not present

## 2017-05-07 DIAGNOSIS — R531 Weakness: Secondary | ICD-10-CM | POA: Diagnosis not present

## 2017-05-07 DIAGNOSIS — B964 Proteus (mirabilis) (morganii) as the cause of diseases classified elsewhere: Secondary | ICD-10-CM | POA: Diagnosis not present

## 2017-05-07 DIAGNOSIS — G20 Parkinson's disease: Secondary | ICD-10-CM | POA: Diagnosis not present

## 2017-05-07 DIAGNOSIS — R269 Unspecified abnormalities of gait and mobility: Secondary | ICD-10-CM | POA: Diagnosis not present

## 2017-05-07 DIAGNOSIS — N390 Urinary tract infection, site not specified: Secondary | ICD-10-CM | POA: Diagnosis not present

## 2017-05-11 DIAGNOSIS — R634 Abnormal weight loss: Secondary | ICD-10-CM | POA: Diagnosis not present

## 2017-05-11 DIAGNOSIS — R531 Weakness: Secondary | ICD-10-CM | POA: Diagnosis not present

## 2017-05-11 DIAGNOSIS — K219 Gastro-esophageal reflux disease without esophagitis: Secondary | ICD-10-CM | POA: Diagnosis not present

## 2017-05-11 DIAGNOSIS — M25512 Pain in left shoulder: Secondary | ICD-10-CM | POA: Diagnosis not present

## 2017-05-11 DIAGNOSIS — R269 Unspecified abnormalities of gait and mobility: Secondary | ICD-10-CM | POA: Diagnosis not present

## 2017-05-14 DIAGNOSIS — K219 Gastro-esophageal reflux disease without esophagitis: Secondary | ICD-10-CM | POA: Diagnosis not present

## 2017-05-14 DIAGNOSIS — R269 Unspecified abnormalities of gait and mobility: Secondary | ICD-10-CM | POA: Diagnosis not present

## 2017-05-14 DIAGNOSIS — G8929 Other chronic pain: Secondary | ICD-10-CM | POA: Diagnosis not present

## 2017-05-14 DIAGNOSIS — F419 Anxiety disorder, unspecified: Secondary | ICD-10-CM | POA: Diagnosis not present

## 2017-05-14 DIAGNOSIS — B964 Proteus (mirabilis) (morganii) as the cause of diseases classified elsewhere: Secondary | ICD-10-CM | POA: Diagnosis not present

## 2017-05-14 DIAGNOSIS — N390 Urinary tract infection, site not specified: Secondary | ICD-10-CM | POA: Diagnosis not present

## 2017-05-14 DIAGNOSIS — R52 Pain, unspecified: Secondary | ICD-10-CM | POA: Diagnosis not present

## 2017-05-14 DIAGNOSIS — R531 Weakness: Secondary | ICD-10-CM | POA: Diagnosis not present

## 2017-05-14 DIAGNOSIS — G20 Parkinson's disease: Secondary | ICD-10-CM | POA: Diagnosis not present

## 2017-05-22 DIAGNOSIS — G20 Parkinson's disease: Secondary | ICD-10-CM | POA: Diagnosis not present

## 2017-05-22 DIAGNOSIS — R269 Unspecified abnormalities of gait and mobility: Secondary | ICD-10-CM | POA: Diagnosis not present

## 2017-05-22 DIAGNOSIS — R531 Weakness: Secondary | ICD-10-CM | POA: Diagnosis not present

## 2017-05-22 DIAGNOSIS — K219 Gastro-esophageal reflux disease without esophagitis: Secondary | ICD-10-CM | POA: Diagnosis not present

## 2017-05-24 DIAGNOSIS — W19XXXD Unspecified fall, subsequent encounter: Secondary | ICD-10-CM | POA: Diagnosis not present

## 2017-05-24 DIAGNOSIS — E039 Hypothyroidism, unspecified: Secondary | ICD-10-CM | POA: Diagnosis not present

## 2017-05-24 DIAGNOSIS — R32 Unspecified urinary incontinence: Secondary | ICD-10-CM | POA: Diagnosis not present

## 2017-05-24 DIAGNOSIS — G20 Parkinson's disease: Secondary | ICD-10-CM | POA: Diagnosis not present

## 2017-05-24 DIAGNOSIS — S43005D Unspecified dislocation of left shoulder joint, subsequent encounter: Secondary | ICD-10-CM | POA: Diagnosis not present

## 2017-05-24 DIAGNOSIS — K219 Gastro-esophageal reflux disease without esophagitis: Secondary | ICD-10-CM | POA: Diagnosis not present

## 2017-05-24 DIAGNOSIS — N183 Chronic kidney disease, stage 3 (moderate): Secondary | ICD-10-CM | POA: Diagnosis not present

## 2017-05-24 DIAGNOSIS — I10 Essential (primary) hypertension: Secondary | ICD-10-CM | POA: Diagnosis not present

## 2017-05-24 DIAGNOSIS — R2689 Other abnormalities of gait and mobility: Secondary | ICD-10-CM | POA: Diagnosis not present

## 2017-05-24 DIAGNOSIS — M069 Rheumatoid arthritis, unspecified: Secondary | ICD-10-CM | POA: Diagnosis not present

## 2017-05-25 DIAGNOSIS — I10 Essential (primary) hypertension: Secondary | ICD-10-CM | POA: Diagnosis not present

## 2017-05-25 DIAGNOSIS — G20 Parkinson's disease: Secondary | ICD-10-CM | POA: Diagnosis not present

## 2017-05-25 DIAGNOSIS — S43005D Unspecified dislocation of left shoulder joint, subsequent encounter: Secondary | ICD-10-CM | POA: Diagnosis not present

## 2017-05-25 DIAGNOSIS — M069 Rheumatoid arthritis, unspecified: Secondary | ICD-10-CM | POA: Diagnosis not present

## 2017-05-25 DIAGNOSIS — R2689 Other abnormalities of gait and mobility: Secondary | ICD-10-CM | POA: Diagnosis not present

## 2017-05-25 DIAGNOSIS — W19XXXD Unspecified fall, subsequent encounter: Secondary | ICD-10-CM | POA: Diagnosis not present

## 2017-05-29 DIAGNOSIS — I10 Essential (primary) hypertension: Secondary | ICD-10-CM | POA: Diagnosis not present

## 2017-05-29 DIAGNOSIS — R2689 Other abnormalities of gait and mobility: Secondary | ICD-10-CM | POA: Diagnosis not present

## 2017-05-29 DIAGNOSIS — G20 Parkinson's disease: Secondary | ICD-10-CM | POA: Diagnosis not present

## 2017-05-29 DIAGNOSIS — S43005D Unspecified dislocation of left shoulder joint, subsequent encounter: Secondary | ICD-10-CM | POA: Diagnosis not present

## 2017-05-29 DIAGNOSIS — W19XXXD Unspecified fall, subsequent encounter: Secondary | ICD-10-CM | POA: Diagnosis not present

## 2017-05-29 DIAGNOSIS — M069 Rheumatoid arthritis, unspecified: Secondary | ICD-10-CM | POA: Diagnosis not present

## 2017-05-30 DIAGNOSIS — M069 Rheumatoid arthritis, unspecified: Secondary | ICD-10-CM | POA: Diagnosis not present

## 2017-05-30 DIAGNOSIS — G20 Parkinson's disease: Secondary | ICD-10-CM | POA: Diagnosis not present

## 2017-05-30 DIAGNOSIS — S43005A Unspecified dislocation of left shoulder joint, initial encounter: Secondary | ICD-10-CM | POA: Diagnosis not present

## 2017-05-30 DIAGNOSIS — R2689 Other abnormalities of gait and mobility: Secondary | ICD-10-CM | POA: Diagnosis not present

## 2017-05-30 DIAGNOSIS — W19XXXD Unspecified fall, subsequent encounter: Secondary | ICD-10-CM | POA: Diagnosis not present

## 2017-05-30 DIAGNOSIS — M25512 Pain in left shoulder: Secondary | ICD-10-CM | POA: Diagnosis not present

## 2017-05-30 DIAGNOSIS — M19012 Primary osteoarthritis, left shoulder: Secondary | ICD-10-CM | POA: Diagnosis not present

## 2017-05-30 DIAGNOSIS — S43005D Unspecified dislocation of left shoulder joint, subsequent encounter: Secondary | ICD-10-CM | POA: Diagnosis not present

## 2017-05-30 DIAGNOSIS — I10 Essential (primary) hypertension: Secondary | ICD-10-CM | POA: Diagnosis not present

## 2017-05-30 DIAGNOSIS — M0689 Other specified rheumatoid arthritis, multiple sites: Secondary | ICD-10-CM | POA: Diagnosis not present

## 2017-05-31 DIAGNOSIS — G20 Parkinson's disease: Secondary | ICD-10-CM | POA: Diagnosis not present

## 2017-05-31 DIAGNOSIS — R2689 Other abnormalities of gait and mobility: Secondary | ICD-10-CM | POA: Diagnosis not present

## 2017-05-31 DIAGNOSIS — W19XXXD Unspecified fall, subsequent encounter: Secondary | ICD-10-CM | POA: Diagnosis not present

## 2017-05-31 DIAGNOSIS — I10 Essential (primary) hypertension: Secondary | ICD-10-CM | POA: Diagnosis not present

## 2017-05-31 DIAGNOSIS — M069 Rheumatoid arthritis, unspecified: Secondary | ICD-10-CM | POA: Diagnosis not present

## 2017-05-31 DIAGNOSIS — S43005D Unspecified dislocation of left shoulder joint, subsequent encounter: Secondary | ICD-10-CM | POA: Diagnosis not present

## 2017-06-01 DIAGNOSIS — R2689 Other abnormalities of gait and mobility: Secondary | ICD-10-CM | POA: Diagnosis not present

## 2017-06-01 DIAGNOSIS — I10 Essential (primary) hypertension: Secondary | ICD-10-CM | POA: Diagnosis not present

## 2017-06-01 DIAGNOSIS — G20 Parkinson's disease: Secondary | ICD-10-CM | POA: Diagnosis not present

## 2017-06-01 DIAGNOSIS — W19XXXD Unspecified fall, subsequent encounter: Secondary | ICD-10-CM | POA: Diagnosis not present

## 2017-06-01 DIAGNOSIS — M069 Rheumatoid arthritis, unspecified: Secondary | ICD-10-CM | POA: Diagnosis not present

## 2017-06-01 DIAGNOSIS — S43005D Unspecified dislocation of left shoulder joint, subsequent encounter: Secondary | ICD-10-CM | POA: Diagnosis not present

## 2017-06-04 DIAGNOSIS — W19XXXD Unspecified fall, subsequent encounter: Secondary | ICD-10-CM | POA: Diagnosis not present

## 2017-06-04 DIAGNOSIS — M069 Rheumatoid arthritis, unspecified: Secondary | ICD-10-CM | POA: Diagnosis not present

## 2017-06-04 DIAGNOSIS — I10 Essential (primary) hypertension: Secondary | ICD-10-CM | POA: Diagnosis not present

## 2017-06-04 DIAGNOSIS — G20 Parkinson's disease: Secondary | ICD-10-CM | POA: Diagnosis not present

## 2017-06-04 DIAGNOSIS — S43005D Unspecified dislocation of left shoulder joint, subsequent encounter: Secondary | ICD-10-CM | POA: Diagnosis not present

## 2017-06-04 DIAGNOSIS — R2689 Other abnormalities of gait and mobility: Secondary | ICD-10-CM | POA: Diagnosis not present

## 2017-06-05 DIAGNOSIS — R2689 Other abnormalities of gait and mobility: Secondary | ICD-10-CM | POA: Diagnosis not present

## 2017-06-05 DIAGNOSIS — S43005D Unspecified dislocation of left shoulder joint, subsequent encounter: Secondary | ICD-10-CM | POA: Diagnosis not present

## 2017-06-05 DIAGNOSIS — I10 Essential (primary) hypertension: Secondary | ICD-10-CM | POA: Diagnosis not present

## 2017-06-05 DIAGNOSIS — M069 Rheumatoid arthritis, unspecified: Secondary | ICD-10-CM | POA: Diagnosis not present

## 2017-06-05 DIAGNOSIS — G20 Parkinson's disease: Secondary | ICD-10-CM | POA: Diagnosis not present

## 2017-06-05 DIAGNOSIS — W19XXXD Unspecified fall, subsequent encounter: Secondary | ICD-10-CM | POA: Diagnosis not present

## 2017-06-06 DIAGNOSIS — R600 Localized edema: Secondary | ICD-10-CM | POA: Diagnosis not present

## 2017-06-06 DIAGNOSIS — R2689 Other abnormalities of gait and mobility: Secondary | ICD-10-CM | POA: Diagnosis not present

## 2017-06-06 DIAGNOSIS — M25512 Pain in left shoulder: Secondary | ICD-10-CM | POA: Diagnosis not present

## 2017-06-06 DIAGNOSIS — G20 Parkinson's disease: Secondary | ICD-10-CM | POA: Diagnosis not present

## 2017-06-06 DIAGNOSIS — R5381 Other malaise: Secondary | ICD-10-CM | POA: Diagnosis not present

## 2017-06-06 DIAGNOSIS — W19XXXD Unspecified fall, subsequent encounter: Secondary | ICD-10-CM | POA: Diagnosis not present

## 2017-06-06 DIAGNOSIS — S43005D Unspecified dislocation of left shoulder joint, subsequent encounter: Secondary | ICD-10-CM | POA: Diagnosis not present

## 2017-06-06 DIAGNOSIS — M069 Rheumatoid arthritis, unspecified: Secondary | ICD-10-CM | POA: Diagnosis not present

## 2017-06-06 DIAGNOSIS — I10 Essential (primary) hypertension: Secondary | ICD-10-CM | POA: Diagnosis not present

## 2017-06-07 DIAGNOSIS — S43005D Unspecified dislocation of left shoulder joint, subsequent encounter: Secondary | ICD-10-CM | POA: Diagnosis not present

## 2017-06-07 DIAGNOSIS — R2689 Other abnormalities of gait and mobility: Secondary | ICD-10-CM | POA: Diagnosis not present

## 2017-06-07 DIAGNOSIS — M069 Rheumatoid arthritis, unspecified: Secondary | ICD-10-CM | POA: Diagnosis not present

## 2017-06-07 DIAGNOSIS — W19XXXD Unspecified fall, subsequent encounter: Secondary | ICD-10-CM | POA: Diagnosis not present

## 2017-06-07 DIAGNOSIS — I10 Essential (primary) hypertension: Secondary | ICD-10-CM | POA: Diagnosis not present

## 2017-06-07 DIAGNOSIS — G20 Parkinson's disease: Secondary | ICD-10-CM | POA: Diagnosis not present

## 2017-06-08 DIAGNOSIS — R2689 Other abnormalities of gait and mobility: Secondary | ICD-10-CM | POA: Diagnosis not present

## 2017-06-08 DIAGNOSIS — G20 Parkinson's disease: Secondary | ICD-10-CM | POA: Diagnosis not present

## 2017-06-08 DIAGNOSIS — M069 Rheumatoid arthritis, unspecified: Secondary | ICD-10-CM | POA: Diagnosis not present

## 2017-06-08 DIAGNOSIS — W19XXXD Unspecified fall, subsequent encounter: Secondary | ICD-10-CM | POA: Diagnosis not present

## 2017-06-08 DIAGNOSIS — S43005D Unspecified dislocation of left shoulder joint, subsequent encounter: Secondary | ICD-10-CM | POA: Diagnosis not present

## 2017-06-08 DIAGNOSIS — I10 Essential (primary) hypertension: Secondary | ICD-10-CM | POA: Diagnosis not present

## 2017-06-11 DIAGNOSIS — I10 Essential (primary) hypertension: Secondary | ICD-10-CM | POA: Diagnosis not present

## 2017-06-11 DIAGNOSIS — R2689 Other abnormalities of gait and mobility: Secondary | ICD-10-CM | POA: Diagnosis not present

## 2017-06-11 DIAGNOSIS — S43005D Unspecified dislocation of left shoulder joint, subsequent encounter: Secondary | ICD-10-CM | POA: Diagnosis not present

## 2017-06-11 DIAGNOSIS — G20 Parkinson's disease: Secondary | ICD-10-CM | POA: Diagnosis not present

## 2017-06-11 DIAGNOSIS — W19XXXD Unspecified fall, subsequent encounter: Secondary | ICD-10-CM | POA: Diagnosis not present

## 2017-06-11 DIAGNOSIS — M069 Rheumatoid arthritis, unspecified: Secondary | ICD-10-CM | POA: Diagnosis not present

## 2017-06-12 DIAGNOSIS — G20 Parkinson's disease: Secondary | ICD-10-CM | POA: Diagnosis not present

## 2017-06-12 DIAGNOSIS — W19XXXD Unspecified fall, subsequent encounter: Secondary | ICD-10-CM | POA: Diagnosis not present

## 2017-06-12 DIAGNOSIS — R2689 Other abnormalities of gait and mobility: Secondary | ICD-10-CM | POA: Diagnosis not present

## 2017-06-12 DIAGNOSIS — I10 Essential (primary) hypertension: Secondary | ICD-10-CM | POA: Diagnosis not present

## 2017-06-12 DIAGNOSIS — S43005D Unspecified dislocation of left shoulder joint, subsequent encounter: Secondary | ICD-10-CM | POA: Diagnosis not present

## 2017-06-12 DIAGNOSIS — M069 Rheumatoid arthritis, unspecified: Secondary | ICD-10-CM | POA: Diagnosis not present

## 2017-06-13 DIAGNOSIS — W19XXXD Unspecified fall, subsequent encounter: Secondary | ICD-10-CM | POA: Diagnosis not present

## 2017-06-13 DIAGNOSIS — I10 Essential (primary) hypertension: Secondary | ICD-10-CM | POA: Diagnosis not present

## 2017-06-13 DIAGNOSIS — M069 Rheumatoid arthritis, unspecified: Secondary | ICD-10-CM | POA: Diagnosis not present

## 2017-06-13 DIAGNOSIS — R2689 Other abnormalities of gait and mobility: Secondary | ICD-10-CM | POA: Diagnosis not present

## 2017-06-13 DIAGNOSIS — S43005D Unspecified dislocation of left shoulder joint, subsequent encounter: Secondary | ICD-10-CM | POA: Diagnosis not present

## 2017-06-13 DIAGNOSIS — G20 Parkinson's disease: Secondary | ICD-10-CM | POA: Diagnosis not present

## 2017-06-14 DIAGNOSIS — M79675 Pain in left toe(s): Secondary | ICD-10-CM | POA: Diagnosis not present

## 2017-06-14 DIAGNOSIS — G20 Parkinson's disease: Secondary | ICD-10-CM | POA: Diagnosis not present

## 2017-06-14 DIAGNOSIS — B351 Tinea unguium: Secondary | ICD-10-CM | POA: Diagnosis not present

## 2017-06-14 DIAGNOSIS — M79674 Pain in right toe(s): Secondary | ICD-10-CM | POA: Diagnosis not present

## 2017-06-14 DIAGNOSIS — I10 Essential (primary) hypertension: Secondary | ICD-10-CM | POA: Diagnosis not present

## 2017-06-14 DIAGNOSIS — R2689 Other abnormalities of gait and mobility: Secondary | ICD-10-CM | POA: Diagnosis not present

## 2017-06-14 DIAGNOSIS — W19XXXD Unspecified fall, subsequent encounter: Secondary | ICD-10-CM | POA: Diagnosis not present

## 2017-06-14 DIAGNOSIS — S43005D Unspecified dislocation of left shoulder joint, subsequent encounter: Secondary | ICD-10-CM | POA: Diagnosis not present

## 2017-06-14 DIAGNOSIS — M069 Rheumatoid arthritis, unspecified: Secondary | ICD-10-CM | POA: Diagnosis not present

## 2017-06-15 DIAGNOSIS — I10 Essential (primary) hypertension: Secondary | ICD-10-CM | POA: Diagnosis not present

## 2017-06-15 DIAGNOSIS — M069 Rheumatoid arthritis, unspecified: Secondary | ICD-10-CM | POA: Diagnosis not present

## 2017-06-15 DIAGNOSIS — W19XXXD Unspecified fall, subsequent encounter: Secondary | ICD-10-CM | POA: Diagnosis not present

## 2017-06-15 DIAGNOSIS — G20 Parkinson's disease: Secondary | ICD-10-CM | POA: Diagnosis not present

## 2017-06-15 DIAGNOSIS — S43005D Unspecified dislocation of left shoulder joint, subsequent encounter: Secondary | ICD-10-CM | POA: Diagnosis not present

## 2017-06-15 DIAGNOSIS — R2689 Other abnormalities of gait and mobility: Secondary | ICD-10-CM | POA: Diagnosis not present

## 2017-06-18 DIAGNOSIS — R2689 Other abnormalities of gait and mobility: Secondary | ICD-10-CM | POA: Diagnosis not present

## 2017-06-18 DIAGNOSIS — W19XXXD Unspecified fall, subsequent encounter: Secondary | ICD-10-CM | POA: Diagnosis not present

## 2017-06-18 DIAGNOSIS — S43005D Unspecified dislocation of left shoulder joint, subsequent encounter: Secondary | ICD-10-CM | POA: Diagnosis not present

## 2017-06-18 DIAGNOSIS — G20 Parkinson's disease: Secondary | ICD-10-CM | POA: Diagnosis not present

## 2017-06-18 DIAGNOSIS — I10 Essential (primary) hypertension: Secondary | ICD-10-CM | POA: Diagnosis not present

## 2017-06-18 DIAGNOSIS — M069 Rheumatoid arthritis, unspecified: Secondary | ICD-10-CM | POA: Diagnosis not present

## 2017-06-20 DIAGNOSIS — S43005D Unspecified dislocation of left shoulder joint, subsequent encounter: Secondary | ICD-10-CM | POA: Diagnosis not present

## 2017-06-20 DIAGNOSIS — M069 Rheumatoid arthritis, unspecified: Secondary | ICD-10-CM | POA: Diagnosis not present

## 2017-06-20 DIAGNOSIS — R2689 Other abnormalities of gait and mobility: Secondary | ICD-10-CM | POA: Diagnosis not present

## 2017-06-20 DIAGNOSIS — G20 Parkinson's disease: Secondary | ICD-10-CM | POA: Diagnosis not present

## 2017-06-20 DIAGNOSIS — W19XXXD Unspecified fall, subsequent encounter: Secondary | ICD-10-CM | POA: Diagnosis not present

## 2017-06-20 DIAGNOSIS — I10 Essential (primary) hypertension: Secondary | ICD-10-CM | POA: Diagnosis not present

## 2017-06-21 DIAGNOSIS — W19XXXD Unspecified fall, subsequent encounter: Secondary | ICD-10-CM | POA: Diagnosis not present

## 2017-06-21 DIAGNOSIS — M069 Rheumatoid arthritis, unspecified: Secondary | ICD-10-CM | POA: Diagnosis not present

## 2017-06-21 DIAGNOSIS — S43005D Unspecified dislocation of left shoulder joint, subsequent encounter: Secondary | ICD-10-CM | POA: Diagnosis not present

## 2017-06-21 DIAGNOSIS — R2689 Other abnormalities of gait and mobility: Secondary | ICD-10-CM | POA: Diagnosis not present

## 2017-06-21 DIAGNOSIS — I10 Essential (primary) hypertension: Secondary | ICD-10-CM | POA: Diagnosis not present

## 2017-06-21 DIAGNOSIS — G20 Parkinson's disease: Secondary | ICD-10-CM | POA: Diagnosis not present

## 2017-06-22 ENCOUNTER — Emergency Department (HOSPITAL_BASED_OUTPATIENT_CLINIC_OR_DEPARTMENT_OTHER)
Admission: EM | Admit: 2017-06-22 | Discharge: 2017-06-22 | Disposition: A | Discharge status: Home / Self Care | Payer: Medicare Other | Attending: Emergency Medicine | Admitting: Emergency Medicine

## 2017-06-22 ENCOUNTER — Encounter (HOSPITAL_BASED_OUTPATIENT_CLINIC_OR_DEPARTMENT_OTHER): Payer: Self-pay | Admitting: *Deleted

## 2017-06-22 ENCOUNTER — Emergency Department (HOSPITAL_BASED_OUTPATIENT_CLINIC_OR_DEPARTMENT_OTHER): Payer: Medicare Other

## 2017-06-22 DIAGNOSIS — Z79899 Other long term (current) drug therapy: Secondary | ICD-10-CM | POA: Diagnosis not present

## 2017-06-22 DIAGNOSIS — Z91040 Latex allergy status: Secondary | ICD-10-CM | POA: Diagnosis not present

## 2017-06-22 DIAGNOSIS — L0291 Cutaneous abscess, unspecified: Secondary | ICD-10-CM | POA: Diagnosis not present

## 2017-06-22 DIAGNOSIS — Z859 Personal history of malignant neoplasm, unspecified: Secondary | ICD-10-CM | POA: Insufficient documentation

## 2017-06-22 DIAGNOSIS — Z7982 Long term (current) use of aspirin: Secondary | ICD-10-CM | POA: Insufficient documentation

## 2017-06-22 DIAGNOSIS — I621 Nontraumatic extradural hemorrhage: Secondary | ICD-10-CM | POA: Diagnosis not present

## 2017-06-22 DIAGNOSIS — L03114 Cellulitis of left upper limb: Secondary | ICD-10-CM | POA: Insufficient documentation

## 2017-06-22 DIAGNOSIS — M79622 Pain in left upper arm: Secondary | ICD-10-CM | POA: Diagnosis present

## 2017-06-22 DIAGNOSIS — M7989 Other specified soft tissue disorders: Secondary | ICD-10-CM | POA: Diagnosis not present

## 2017-06-22 DIAGNOSIS — S4990XA Unspecified injury of shoulder and upper arm, unspecified arm, initial encounter: Secondary | ICD-10-CM | POA: Diagnosis not present

## 2017-06-22 DIAGNOSIS — G20 Parkinson's disease: Secondary | ICD-10-CM | POA: Insufficient documentation

## 2017-06-22 DIAGNOSIS — B999 Unspecified infectious disease: Secondary | ICD-10-CM | POA: Diagnosis not present

## 2017-06-22 DIAGNOSIS — M069 Rheumatoid arthritis, unspecified: Secondary | ICD-10-CM | POA: Diagnosis not present

## 2017-06-22 DIAGNOSIS — L03116 Cellulitis of left lower limb: Secondary | ICD-10-CM | POA: Diagnosis not present

## 2017-06-22 LAB — BASIC METABOLIC PANEL
Anion gap: 5 (ref 5–15)
BUN: 13 mg/dL (ref 6–20)
CHLORIDE: 107 mmol/L (ref 101–111)
CO2: 27 mmol/L (ref 22–32)
Calcium: 8.4 mg/dL — ABNORMAL LOW (ref 8.9–10.3)
Creatinine, Ser: 0.85 mg/dL (ref 0.44–1.00)
GFR calc Af Amer: >60 mL/min (ref >60)
GFR calc non Af Amer: >60 mL/min (ref >60)
GLUCOSE: 85 mg/dL (ref 65–99)
POTASSIUM: 3.7 mmol/L (ref 3.5–5.1)
Sodium: 139 mmol/L (ref 135–145)

## 2017-06-22 LAB — CBC WITH DIFFERENTIAL/PLATELET
Basophils Absolute: 0 10*3/uL (ref 0.0–0.1)
Basophils Relative: 0 %
EOS PCT: 3 %
Eosinophils Absolute: 0.2 10*3/uL (ref 0.0–0.7)
HEMATOCRIT: 36.2 % (ref 36.0–46.0)
Hemoglobin: 11.4 g/dL — ABNORMAL LOW (ref 12.0–15.0)
LYMPHS ABS: 1.1 10*3/uL (ref 0.7–4.0)
LYMPHS PCT: 16 %
MCH: 30.2 pg (ref 26.0–34.0)
MCHC: 31.5 g/dL (ref 30.0–36.0)
MCV: 96 fL (ref 78.0–100.0)
MONO ABS: 0.9 10*3/uL (ref 0.1–1.0)
Monocytes Relative: 13 %
NEUTROS ABS: 4.7 10*3/uL (ref 1.7–7.7)
Neutrophils Relative %: 68 %
PLATELETS: 315 10*3/uL (ref 150–400)
RBC: 3.77 MIL/uL — AB (ref 3.87–5.11)
RDW: 15.5 % (ref 11.5–15.5)
WBC: 7 10*3/uL (ref 4.0–10.5)

## 2017-06-22 MED ORDER — BUPIVACAINE HCL 0.25 % IJ SOLN
10.0000 mL | Freq: Once | INTRAMUSCULAR | Status: AC
  Administered 2017-06-22: 10 mL
  Filled 2017-06-22: qty 1

## 2017-06-22 MED ORDER — CLINDAMYCIN HCL 150 MG PO CAPS: 300.0000 mg | ORAL_CAPSULE | Freq: Three times a day (TID) | ORAL | 0 refills | Status: DC

## 2017-06-22 MED ORDER — ACETAMINOPHEN 325 MG PO TABS
650.0000 mg | ORAL_TABLET | Freq: Once | ORAL | Status: AC
  Administered 2017-06-22: 650 mg via ORAL
  Filled 2017-06-22: qty 2

## 2017-06-22 MED ORDER — CLINDAMYCIN PHOSPHATE 600 MG/50ML IV SOLN
600.0000 mg | Freq: Once | INTRAVENOUS | Status: AC
  Administered 2017-06-22: 600 mg via INTRAVENOUS
  Filled 2017-06-22: qty 50

## 2017-06-22 MED ORDER — SULFAMETHOXAZOLE-TRIMETHOPRIM 800-160 MG PO TABS
1.0000 | ORAL_TABLET | Freq: Once | ORAL | Status: AC
  Administered 2017-06-22: 1 via ORAL
  Filled 2017-06-22: qty 1

## 2017-06-22 MED ORDER — CLINDAMYCIN HCL 150 MG PO CAPS: 300.0000 mg | ORAL_CAPSULE | Freq: Three times a day (TID) | ORAL | 0 refills | Status: AC

## 2017-06-22 NOTE — ED Notes (Signed)
[facility] arrived for transport. D/C information discussed with pt. Report given to Pamala Hurry, RN at facility. Pt and RN informed to complete all antibiotics and follow up with primary on monday for wound evaluation. Pt and RN expressed understanding.

## 2017-06-22 NOTE — ED Provider Notes (Signed)
Medical screening examination/treatment/procedure(s) were conducted as a shared visit with non-physician practitioner(s) and myself.  I personally evaluated the patient during the encounter.   EKG Interpretation None      82 year old female who presents with left arm pain, onset this morning. Noticed increased redness/warmth and swelling the left upper arm. No fever, chills, n/v, fall or injury. Also with right lower leg swelling x several weeks. No recent immobolization, surgery, chest pain or shortness of breath.  Bedside of left upper arm reveals cobblestoning with possible underlying fluid collection c/f abscess. Blood work reassuring. No WBC count. Afebrile and HD stable.  I&D to be attempted at bedside  Plan to start outpatient antibiotics with close follow-up.   Forde Dandy, MD 06/22/17 [facility]

## 2017-06-22 NOTE — Discharge Instructions (Signed)
Please see the information and instructions below regarding your visit.  Your diagnoses today include:  1. Cellulitis of right upper extremity    Cellulitis is a superficial skin infection. Please take your antibiotics as prescribed for their ENTIRE prescribed duration.   We performed a procedure called incision and drainage. This showed clear fluid that we call serous fluid. There was no pus. This may have happened due to manipulation of your arm or significant rubbing. We would like you to avoid rubbing the side f your arm on anything.  Tests performed today include: See side panel of your discharge paperwork for testing performed today. Vital signs are listed at the bottom of these instructions.   Medications prescribed:    Take any prescribed medications only as prescribed, and any over the counter medications only as directed on the packaging.  1. Clindamycin. Please take all of your antibiotics until finished.   You may develop abdominal discomfort or nausea from the antibiotic. If this occurs, you may take it with food. Some patients also get diarrhea with antibiotics. You may help offset this with probiotics which you can buy or get in yogurt. Do not eat or take the probiotics until 2 hours after your antibiotic. Some women develop vaginal yeast infections after antibiotics. If you develop unusual vaginal discharge after being on this medication, please see your primary care provider.   Some people develop allergies to antibiotics. Symptoms of antibiotic allergy can be mild and include a flat rash and itching. They can also be more serious and include:  ?Hives - Hives are raised, red patches of skin that are usually very itchy.  ?Lip or tongue swelling  ?Trouble swallowing or breathing  ?Blistering of the skin or mouth.  If you have any of these serious symptoms, please seek emergency medical care immediately.  2. Please take your as needed prescription medication available at  the facility you live at for pain, including hydrocodone and tylenol.   Home care instructions:  Please follow any educational materials contained in this packet.   Keep affected area above the level of your heart when possible. Wash area gently twice a day with warm soapy water. Do not apply alcohol or hydrogen peroxide. Cover the area if it draining or weeping. Dry completely after the area is wet.   Follow-up instructions: Please return to the ED in 48 hours for a check of the infection.   Return instructions:  Please return to the Emergency Department if you experience worsening symptoms. Return to the ER if you develop worsening signs of infection such as: increased redness, increased pain, pus, fever, or other symptoms that concern you. Please return for any increased swelling in your arm. Please monitor the area we marked with a pen today. Please return if you have any other emergent concerns.  Additional Information:  Your vital signs today were: BP (!) 156/77 (BP Location: Right Arm)    Pulse 77    Temp 98.2 F (36.8 C) (Oral)    Resp 18    SpO2 94%  If your blood pressure (BP) was elevated on multiple readings during this visit above 130 for the top number or above 80 for the bottom number, please have this repeated by your primary care provider within one month. --------------  Thank you for allowing [place] to participate in your care today. It was a pleasure taking care of you today!

## 2017-06-22 NOTE — ED Provider Notes (Signed)
  Physical Exam  BP (!) 166/78 (BP Location: Right Arm)   Pulse 71   Temp 97.9 F (36.6 C) (Oral)   Resp 18   SpO2 95%   Physical Exam  ED Course  Procedures  MDM Patient seen by Dr. Oleta Mouse and Yetta Flock, [place]. Patient has L upper arm cellulitis. I and D performed by PA. I was called to evaluate the wound after I and D. There is small wound with serosanguinous drainage. Bedside US showed cobblestoning with no obvious abscess. No obvious superficial thrombophlebitis. I also [place] the brachial vein and there is no obvious DVT there but I wasn't able to get a good view of axillary vein. WBC nl. I think likely inflammation vs early cellulitis. Given clinda and will dc home with clinda. Will have patient return in 2 days for wound check.    Drenda Freeze, MD 06/22/17 [phone]

## 2017-06-22 NOTE — ED Provider Notes (Signed)
[place] DEPT [facility] Provider Note   CSN: [id number] Arrival date & time: 06/22/17  1038     History   Chief Complaint Chief Complaint  Patient presents with  . Arm Pain    HPI Shannon Vincent is a 82 y.o. female.  HPI  Patient is a 82 year old female with a history of rheumatoid arthritis (on methotrexate), Parkinson's disease,  Osteoarthritis, and asteatotic eczema presenting for left upper arm pain and swelling. Patient reports she has had pain in her left upper arm for several weeks due to a dislocation in her left shoulder, which she has been working with occupational therapy on. Patient reports she frequently rubs the right upper extremity on her assisted devices such as walker or wheelchair, or in her physical therapy exercises. Patient reports that she rolled over in bed last night and felt a painful "lump" in her left upper arm. Patient reports that this arm feels "tight" and is approximately 5 out of 10 in severity. Patient given hydrocodone at her nursing facility for pain, which improved it. Patient denies any history of surgical procedures to that left arm, IVs (patient cannot recall if any may have been placed on admission on /1), bites, or injuries. Patient denies any systemic symptoms such as fever, chills, nausea, vomiting. Patient denies any swelling in her distal left upper cavity, weakness, or numbness. Additionally, patient has had 1.5 weeks of right lower external swelling and erythema. Patient reports she has lower extremity edema frequently, but the erythema is new. This is nonpainful. Patient denies any chest pain, shortness of breath, use of estrogen, history of DVT/PE, or hospitalization in the past month but was hospitalized for 3 days in the past 3 months for multiple falls. No recent surgeries requiring anesthesia.  Past Medical History:  Diagnosis Date  . Arthritis    asteotosis  . Cancer ([place])   . Dermatitis    asteotosis  . Osteoarthritis   .  Parkinson disease [facility])    [facility]    Patient Active Problem List   Diagnosis Date Noted  . Zenker diverticula 11/13/2016  . Rheumatoid arthritis ([place]) 11/13/2016  . Depression with anxiety 11/13/2016  . Ambulatory dysfunction 05/03/2016  . Traumatic hematoma of forehead 03/31/2016  . Breast cancer screening 01/03/2016  . UTI (urinary tract infection) 10/29/2015  . Vitamin D deficiency 07/29/2015  . Absolute anemia 07/29/2015  . Thyroid activity decreased 07/29/2015  . Seasonal allergies 07/29/2015  . Postmenopausal 07/29/2015  . S/P surgical manipulation of ankle joint 07/29/2015  . OBESITY, UNSPECIFIED 12/02/2010  . FREQUENCY, URINARY 07/01/2010  . ESSENTIAL HYPERTENSION, BENIGN 04/01/2010  . HYPERLIPIDEMIA 02/22/2010  . ANXIETY STATE, UNSPECIFIED 02/22/2010  . Parkinson disease ([place]) 02/22/2010  . Osteoarthritis 02/22/2010    Past Surgical History:  Procedure Laterality Date  . ANKLE FRACTURE SURGERY  12/2014  . BRAIN SURGERY  09-10, 04-11   deep brain stimulator  . SPINE SURGERY  2004   cervical spine fusion     OB History    No data available       Home Medications    Prior to Admission medications   Medication Sig Start Date End Date Taking? Authorizing Provider  acetaminophen (TYLENOL) 325 MG tablet Take 2 tablets (650 mg total) by mouth every 6 (six) hours as needed for mild pain, moderate pain, fever or headache. 05/17/15   Emeterio Reeve, DO  amantadine (SYMMETREL) 100 MG capsule Take 1 capsule (100 mg total) by mouth 3 (three) times daily. 11/13/16  Emeterio Reeve, DO  AMBULATORY NON [facility] MEDICATION Shower Chair - Diagnosis - Parkinson's disease ICD-10 G20 05/28/15   Emeterio Reeve, DO  aspirin 81 MG EC tablet TAKE ONE TABLET BY MOUTH EVERY DAY 10/17/16   Emeterio Reeve, DO  Calcium Carbonate-Vitamin D 600-400 MG-UNIT chew tablet Chew 2 tablets by mouth daily. 11/15/16   Emeterio Reeve, DO  carbidopa-levodopa (SINEMET IR) 25-100 MG  tablet TAKE TWO TABLETS BY MOUTH EVERY 3 HOURS. MAX OF 7 DOSES A DAY 11/13/16   Emeterio Reeve, DO  cetirizine (ZYRTEC) 5 MG tablet Take 1 tablet (5 mg total) by mouth daily as needed for allergies or rhinitis. 07/29/15   Emeterio Reeve, DO  escitalopram (LEXAPRO) 5 MG tablet Take 1 tablet (5 mg total) by mouth daily. 11/13/16   Emeterio Reeve, DO  fluticasone [facility]) 50 MCG/ACT nasal spray Place 2 sprays into both nostrils daily. 08/28/16   Emeterio Reeve, DO  folic acid (FOLVITE) 1 MG tablet TAKE ONE TABLET BY MOUTH EVERY DAY 12/18/16   Emeterio Reeve, DO  levothyroxine (SYNTHROID, LEVOTHROID) 100 MCG tablet TAKE ONE TABLET BY MOUTH EVERY DAY BEFORE BREAKFAST. Due to recheck labs 04/2017 11/15/16   Emeterio Reeve, DO  losartan (COZAAR) 100 MG tablet Take 1 tablet (100 mg total) by mouth daily. 04/10/17   Emeterio Reeve, DO  methotrexate (RHEUMATREX) 2.5 MG tablet Take 6 tablets (15 mg total) by mouth once a week. Caution:Chemotherapy. Protect from light. 11/13/16   Emeterio Reeve, DO  mirtazapine (REMERON) 15 MG tablet Take 1 tablet (15 mg total) by mouth at bedtime. 11/13/16   Emeterio Reeve, DO  pantoprazole (PROTONIX) 40 MG tablet Take 1 tablet (40 mg total) by mouth daily. 11/13/16   Emeterio Reeve, DO  pramipexole (MIRAPEX) 0.5 MG tablet Take 1 tablet (0.5 mg total) by mouth 5 (five) times daily. 11/13/16   Emeterio Reeve, DO  SENEXON-S 8.6-50 MG tablet TAKE 2 TABLETS BY MOUTH AT BEDTIME AS NEEDED FOR MILD CONSTIPATION 08/08/16   Emeterio Reeve, DO  vitamin B-12 (CYANOCOBALAMIN) 1000 MCG tablet Take one by mouth every day 11/13/16   Emeterio Reeve, DO    Family History History reviewed. No pertinent family history.  Social History Social History  Substance Use Topics  . Smoking status: Never Smoker  . Smokeless tobacco: Never Used  . Alcohol use No     Allergies   Codeine; Morphine; Adhesive [tape]; and Latex   Review of Systems Review  of Systems  Constitutional: Negative for chills and fever.  HENT: Negative for congestion, sinus pain and sore throat.   Eyes: Negative for visual disturbance.  Respiratory: Negative for cough, chest tightness and shortness of breath.   Cardiovascular: Positive for leg swelling. Negative for chest pain and palpitations.  Gastrointestinal: Positive for diarrhea. Negative for abdominal pain, constipation, nausea and vomiting.       Patient reports recent soft stools.  Genitourinary: Negative for dysuria and flank pain.  Musculoskeletal: Negative for back pain and myalgias.  Skin: Positive for color change. Negative for rash.  Neurological: Negative for dizziness, light-headedness and headaches.     Physical Exam Updated Vital Signs BP (!) 146/112 (BP Location: Right Arm)   Pulse 75   Temp 97.9 F (36.6 C) (Oral)   Resp 18   SpO2 96%   Physical Exam  Constitutional: She appears well-developed and well-nourished. No distress.  HENT:  Head: Normocephalic and atraumatic.  Mouth/Throat: Oropharynx is clear and moist.  Eyes: Pupils are equal, round, and reactive to light. Conjunctivae  and EOM are normal.  Neck: Normal range of motion. Neck supple.  Cardiovascular: Normal rate, regular rhythm, S1 normal and S2 normal.   No murmur heard. 1+ pitting lower extremity edema of the left lower extremity. 2+ pitting edema of the right lower extremity. Erythema extending to tibial tuberosity of right lower extremity. No pain to palpation of this extremity.  Pulmonary/Chest: Effort normal. She has no wheezes. She has rales.  Coarse crackles in b/l lower lung fields.  Abdominal: Soft. She exhibits no distension. There is no tenderness. There is no guarding.  Musculoskeletal: Normal range of motion. She exhibits no edema or deformity.  Lymphadenopathy:    She has no cervical adenopathy.  Neurological: She is alert.  Cranial nerves grossly intact. SENSORY RUE: Sensation is intact to light touch  in:  Superficial radial nerve distribution (dorsal first web space) Median nerve distribution (tip of index finger)   Ulnar nerve distribution (tip of small finger)    MOTOR RUE: Strength 5/5 with shoulder abduction/adduction, biceps flexion/extension, grip strength VASCULAR: 2+ radial pulse and ulnar pulse b/l Brisk capillary refill < 2 sec, fingers warm and well-perfused COMPARTMENTS: Soft, warm, well-perfused No pain with passive extension No paresthesias  Skin: Skin is warm and dry. No rash noted. There is erythema.  Erythema and significant induration over left bicep. No fluctuance palpated. Post I & D picture with outline of erythema below.   Psychiatric: She has a normal mood and affect. Her behavior is normal. Judgment and thought content normal.  Nursing note and vitals reviewed.      ED Treatments / Results  Labs (all labs ordered are listed, but only abnormal results are displayed) Labs Reviewed - No data to display  EKG  EKG Interpretation None       Radiology No results found.  Procedures .Marland KitchenIncision and Drainage Date/Time: 06/22/2017 6:29 PM Performed by: Albesa Seen Authorized by: Langston Masker B   Consent:    Consent obtained:  Verbal   Consent given by:  Patient   Risks discussed:  Bleeding, incomplete drainage, infection, pain and damage to other organs Location:    Type:  Seroma   Size:  2.5 x 2.5   Location:  Upper extremity Pre-procedure details:    Skin preparation:  Betadine and Chloraprep Anesthesia (see MAR for exact dosages):    Anesthesia method:  Local infiltration   Local anesthetic:  Bupivacaine 0.25% w/o epi Procedure details:    Incision types:  Single straight   Incision depth:  Subcutaneous   Scalpel blade:  11   Wound management:  Probed and deloculated, irrigated with saline and extensive cleaning   Drainage:  Serous and bloody   Drainage amount:  Copious   Wound treatment:  Wound left open   Packing materials:   None Post-procedure details:    Patient tolerance of procedure:  Tolerated well, no immediate complications Comments:     Assisted by Margarita Mail, PA-C.   (including critical care time)  Medications Ordered in ED Medications - No data to display   Initial Impression / Assessment and Plan / ED Course  I have reviewed the triage vital signs and the nursing notes.  Pertinent labs & imaging results that were available during my care of the patient were reviewed by me and considered in my medical decision making (see chart for details).    Final Clinical Impressions(s) / ED Diagnoses   Final diagnoses:  None   MDM  Differential diagnosis includes cellulitis, purulent cellulitis, lymphedema,  venous insufficiency, DVT. DVT versus lymphedema of right lower extremity. Tylenol for pain control, requested by patient.  Patient is afebrile, well-appearing, and in no acute distress. Lab work is unremarkable for leukocytosis or other abnormality. Normocytic anemia is chronic. Bedside ultrasound of LUE demonstrates cobblestoning over area of infection, and fluid collection approximately 2.5 x 2.6 cm and 1.5 cm deep to the service of the skin, suggestive of purulent cellulitis. This was evaluated by Dr. Brantley Stage. Suggested to I&D at bedside. No blood thinners. ASA 81 mg daily only antiplatelet agent. I & D performed without complications. Copious serous fluid expressed from I&D. No purulence expressed. This seroma may be due to manipulation of the arm during her PT exercise, or rubbing against assistive devices. Patient reevaluated by Dr. Shirlyn Goltz, attending physician. Wound dressed. Patient to receive 1 dose IV clindamycin emergency department and follow-up with outpatient Clindamycin. Patient instructed to return to the emergency department in 48 hours to receive wound check. Patient instructed to return sooner for any increased swelling, pain, fever, chills, changes in sensation or strength to the  right upper extremity.  New onset right lower extremity erythema and edema likely venous stasis. DVT study of RLE demonstrated no DVT. Patient instructed to use compression stockings or wraps to the RLE.   This is a shared visit with Dr. Brantley Stage. Patient was independently evaluated by this attending physician. Attending physician consulted in evaluation and discharge management.   New Prescriptions New Prescriptions   No medications on file     Tamala Julian 06/22/17 1858    Forde Dandy, MD 06/23/17 [phone]

## 2017-06-22 NOTE — ED Triage Notes (Signed)
Pt reports noticing pain to her left upper arm when she got up to use the bathroom last night. This am noticed the pain along with some redness and swelling. Area to upper arm is tender, red, warm to touch. Denies any injury or trauma.

## 2017-06-22 NOTE — ED Notes (Signed)
ED Provider at bedside x2, performing I&D on left upper arm.  Pt tolerating procedure well.

## 2017-06-25 DIAGNOSIS — I10 Essential (primary) hypertension: Secondary | ICD-10-CM | POA: Diagnosis not present

## 2017-06-25 DIAGNOSIS — M069 Rheumatoid arthritis, unspecified: Secondary | ICD-10-CM | POA: Diagnosis not present

## 2017-06-25 DIAGNOSIS — S43005D Unspecified dislocation of left shoulder joint, subsequent encounter: Secondary | ICD-10-CM | POA: Diagnosis not present

## 2017-06-25 DIAGNOSIS — W19XXXD Unspecified fall, subsequent encounter: Secondary | ICD-10-CM | POA: Diagnosis not present

## 2017-06-25 DIAGNOSIS — G20 Parkinson's disease: Secondary | ICD-10-CM | POA: Diagnosis not present

## 2017-06-25 DIAGNOSIS — R2689 Other abnormalities of gait and mobility: Secondary | ICD-10-CM | POA: Diagnosis not present

## 2017-06-26 DIAGNOSIS — I10 Essential (primary) hypertension: Secondary | ICD-10-CM | POA: Diagnosis not present

## 2017-06-26 DIAGNOSIS — G20 Parkinson's disease: Secondary | ICD-10-CM | POA: Diagnosis not present

## 2017-06-26 DIAGNOSIS — S43005D Unspecified dislocation of left shoulder joint, subsequent encounter: Secondary | ICD-10-CM | POA: Diagnosis not present

## 2017-06-26 DIAGNOSIS — W19XXXD Unspecified fall, subsequent encounter: Secondary | ICD-10-CM | POA: Diagnosis not present

## 2017-06-26 DIAGNOSIS — R2689 Other abnormalities of gait and mobility: Secondary | ICD-10-CM | POA: Diagnosis not present

## 2017-06-26 DIAGNOSIS — M069 Rheumatoid arthritis, unspecified: Secondary | ICD-10-CM | POA: Diagnosis not present

## 2017-06-27 DIAGNOSIS — I10 Essential (primary) hypertension: Secondary | ICD-10-CM | POA: Diagnosis not present

## 2017-06-27 DIAGNOSIS — R2689 Other abnormalities of gait and mobility: Secondary | ICD-10-CM | POA: Diagnosis not present

## 2017-06-27 DIAGNOSIS — S43005D Unspecified dislocation of left shoulder joint, subsequent encounter: Secondary | ICD-10-CM | POA: Diagnosis not present

## 2017-06-27 DIAGNOSIS — W19XXXD Unspecified fall, subsequent encounter: Secondary | ICD-10-CM | POA: Diagnosis not present

## 2017-06-27 DIAGNOSIS — L03114 Cellulitis of left upper limb: Secondary | ICD-10-CM | POA: Diagnosis not present

## 2017-06-27 DIAGNOSIS — M069 Rheumatoid arthritis, unspecified: Secondary | ICD-10-CM | POA: Diagnosis not present

## 2017-06-27 DIAGNOSIS — G20 Parkinson's disease: Secondary | ICD-10-CM | POA: Diagnosis not present

## 2017-06-27 DIAGNOSIS — Z5189 Encounter for other specified aftercare: Secondary | ICD-10-CM | POA: Diagnosis not present

## 2017-06-27 DIAGNOSIS — R5381 Other malaise: Secondary | ICD-10-CM | POA: Diagnosis not present

## 2017-06-28 DIAGNOSIS — G20 Parkinson's disease: Secondary | ICD-10-CM | POA: Diagnosis not present

## 2017-06-28 DIAGNOSIS — R2689 Other abnormalities of gait and mobility: Secondary | ICD-10-CM | POA: Diagnosis not present

## 2017-06-28 DIAGNOSIS — W19XXXD Unspecified fall, subsequent encounter: Secondary | ICD-10-CM | POA: Diagnosis not present

## 2017-06-28 DIAGNOSIS — I10 Essential (primary) hypertension: Secondary | ICD-10-CM | POA: Diagnosis not present

## 2017-06-28 DIAGNOSIS — M069 Rheumatoid arthritis, unspecified: Secondary | ICD-10-CM | POA: Diagnosis not present

## 2017-06-28 DIAGNOSIS — S43005D Unspecified dislocation of left shoulder joint, subsequent encounter: Secondary | ICD-10-CM | POA: Diagnosis not present

## 2017-06-29 DIAGNOSIS — G20 Parkinson's disease: Secondary | ICD-10-CM | POA: Diagnosis not present

## 2017-06-29 DIAGNOSIS — R2689 Other abnormalities of gait and mobility: Secondary | ICD-10-CM | POA: Diagnosis not present

## 2017-06-29 DIAGNOSIS — S43005D Unspecified dislocation of left shoulder joint, subsequent encounter: Secondary | ICD-10-CM | POA: Diagnosis not present

## 2017-06-29 DIAGNOSIS — M069 Rheumatoid arthritis, unspecified: Secondary | ICD-10-CM | POA: Diagnosis not present

## 2017-06-29 DIAGNOSIS — W19XXXD Unspecified fall, subsequent encounter: Secondary | ICD-10-CM | POA: Diagnosis not present

## 2017-06-29 DIAGNOSIS — I10 Essential (primary) hypertension: Secondary | ICD-10-CM | POA: Diagnosis not present

## 2017-07-01 DIAGNOSIS — X58XXXA Exposure to other specified factors, initial encounter: Secondary | ICD-10-CM | POA: Diagnosis not present

## 2017-07-01 DIAGNOSIS — S43005A Unspecified dislocation of left shoulder joint, initial encounter: Secondary | ICD-10-CM | POA: Diagnosis not present

## 2017-07-01 DIAGNOSIS — Y998 Other external cause status: Secondary | ICD-10-CM | POA: Diagnosis not present

## 2017-07-01 DIAGNOSIS — S43012A Anterior subluxation of left humerus, initial encounter: Secondary | ICD-10-CM | POA: Diagnosis not present

## 2017-07-01 DIAGNOSIS — G8911 Acute pain due to trauma: Secondary | ICD-10-CM | POA: Diagnosis not present

## 2017-07-01 DIAGNOSIS — G20 Parkinson's disease: Secondary | ICD-10-CM | POA: Diagnosis not present

## 2017-07-01 DIAGNOSIS — B999 Unspecified infectious disease: Secondary | ICD-10-CM | POA: Diagnosis not present

## 2017-07-01 DIAGNOSIS — K219 Gastro-esophageal reflux disease without esophagitis: Secondary | ICD-10-CM | POA: Diagnosis not present

## 2017-07-01 DIAGNOSIS — M79602 Pain in left arm: Secondary | ICD-10-CM | POA: Diagnosis not present

## 2017-07-01 DIAGNOSIS — E785 Hyperlipidemia, unspecified: Secondary | ICD-10-CM | POA: Diagnosis not present

## 2017-07-02 DIAGNOSIS — M069 Rheumatoid arthritis, unspecified: Secondary | ICD-10-CM | POA: Diagnosis not present

## 2017-07-02 DIAGNOSIS — I10 Essential (primary) hypertension: Secondary | ICD-10-CM | POA: Diagnosis not present

## 2017-07-02 DIAGNOSIS — G20 Parkinson's disease: Secondary | ICD-10-CM | POA: Diagnosis not present

## 2017-07-02 DIAGNOSIS — R2689 Other abnormalities of gait and mobility: Secondary | ICD-10-CM | POA: Diagnosis not present

## 2017-07-02 DIAGNOSIS — W19XXXD Unspecified fall, subsequent encounter: Secondary | ICD-10-CM | POA: Diagnosis not present

## 2017-07-02 DIAGNOSIS — S43005D Unspecified dislocation of left shoulder joint, subsequent encounter: Secondary | ICD-10-CM | POA: Diagnosis not present

## 2017-07-03 DIAGNOSIS — I10 Essential (primary) hypertension: Secondary | ICD-10-CM | POA: Diagnosis not present

## 2017-07-03 DIAGNOSIS — W19XXXD Unspecified fall, subsequent encounter: Secondary | ICD-10-CM | POA: Diagnosis not present

## 2017-07-03 DIAGNOSIS — G20 Parkinson's disease: Secondary | ICD-10-CM | POA: Diagnosis not present

## 2017-07-03 DIAGNOSIS — M069 Rheumatoid arthritis, unspecified: Secondary | ICD-10-CM | POA: Diagnosis not present

## 2017-07-03 DIAGNOSIS — S43005D Unspecified dislocation of left shoulder joint, subsequent encounter: Secondary | ICD-10-CM | POA: Diagnosis not present

## 2017-07-03 DIAGNOSIS — R2689 Other abnormalities of gait and mobility: Secondary | ICD-10-CM | POA: Diagnosis not present

## 2017-07-04 DIAGNOSIS — W19XXXD Unspecified fall, subsequent encounter: Secondary | ICD-10-CM | POA: Diagnosis not present

## 2017-07-04 DIAGNOSIS — G20 Parkinson's disease: Secondary | ICD-10-CM | POA: Diagnosis not present

## 2017-07-04 DIAGNOSIS — I10 Essential (primary) hypertension: Secondary | ICD-10-CM | POA: Diagnosis not present

## 2017-07-04 DIAGNOSIS — S43005A Unspecified dislocation of left shoulder joint, initial encounter: Secondary | ICD-10-CM | POA: Diagnosis not present

## 2017-07-04 DIAGNOSIS — R2689 Other abnormalities of gait and mobility: Secondary | ICD-10-CM | POA: Diagnosis not present

## 2017-07-04 DIAGNOSIS — S43005D Unspecified dislocation of left shoulder joint, subsequent encounter: Secondary | ICD-10-CM | POA: Diagnosis not present

## 2017-07-04 DIAGNOSIS — R5381 Other malaise: Secondary | ICD-10-CM | POA: Diagnosis not present

## 2017-07-04 DIAGNOSIS — M069 Rheumatoid arthritis, unspecified: Secondary | ICD-10-CM | POA: Diagnosis not present

## 2017-07-05 DIAGNOSIS — R2689 Other abnormalities of gait and mobility: Secondary | ICD-10-CM | POA: Diagnosis not present

## 2017-07-05 DIAGNOSIS — M069 Rheumatoid arthritis, unspecified: Secondary | ICD-10-CM | POA: Diagnosis not present

## 2017-07-05 DIAGNOSIS — G20 Parkinson's disease: Secondary | ICD-10-CM | POA: Diagnosis not present

## 2017-07-05 DIAGNOSIS — W19XXXD Unspecified fall, subsequent encounter: Secondary | ICD-10-CM | POA: Diagnosis not present

## 2017-07-05 DIAGNOSIS — I10 Essential (primary) hypertension: Secondary | ICD-10-CM | POA: Diagnosis not present

## 2017-07-05 DIAGNOSIS — S43005D Unspecified dislocation of left shoulder joint, subsequent encounter: Secondary | ICD-10-CM | POA: Diagnosis not present

## 2017-07-06 DIAGNOSIS — R2689 Other abnormalities of gait and mobility: Secondary | ICD-10-CM | POA: Diagnosis not present

## 2017-07-06 DIAGNOSIS — G20 Parkinson's disease: Secondary | ICD-10-CM | POA: Diagnosis not present

## 2017-07-06 DIAGNOSIS — W19XXXD Unspecified fall, subsequent encounter: Secondary | ICD-10-CM | POA: Diagnosis not present

## 2017-07-06 DIAGNOSIS — I10 Essential (primary) hypertension: Secondary | ICD-10-CM | POA: Diagnosis not present

## 2017-07-06 DIAGNOSIS — S43005D Unspecified dislocation of left shoulder joint, subsequent encounter: Secondary | ICD-10-CM | POA: Diagnosis not present

## 2017-07-06 DIAGNOSIS — M069 Rheumatoid arthritis, unspecified: Secondary | ICD-10-CM | POA: Diagnosis not present

## 2017-07-07 DIAGNOSIS — R51 Headache: Secondary | ICD-10-CM | POA: Diagnosis not present

## 2017-07-07 DIAGNOSIS — M25512 Pain in left shoulder: Secondary | ICD-10-CM | POA: Diagnosis not present

## 2017-07-07 DIAGNOSIS — S098XXA Other specified injuries of head, initial encounter: Secondary | ICD-10-CM | POA: Diagnosis not present

## 2017-07-07 DIAGNOSIS — S0990XA Unspecified injury of head, initial encounter: Secondary | ICD-10-CM | POA: Diagnosis not present

## 2017-07-07 DIAGNOSIS — W06XXXA Fall from bed, initial encounter: Secondary | ICD-10-CM | POA: Diagnosis not present

## 2017-07-07 DIAGNOSIS — G4489 Other headache syndrome: Secondary | ICD-10-CM | POA: Diagnosis not present

## 2017-07-07 DIAGNOSIS — S43015A Anterior dislocation of left humerus, initial encounter: Secondary | ICD-10-CM | POA: Diagnosis not present

## 2017-07-07 DIAGNOSIS — S0993XA Unspecified injury of face, initial encounter: Secondary | ICD-10-CM | POA: Diagnosis not present

## 2017-07-07 DIAGNOSIS — R4182 Altered mental status, unspecified: Secondary | ICD-10-CM | POA: Diagnosis not present

## 2017-07-07 DIAGNOSIS — Y998 Other external cause status: Secondary | ICD-10-CM | POA: Diagnosis not present

## 2017-07-07 DIAGNOSIS — S43085D Other dislocation of left shoulder joint, subsequent encounter: Secondary | ICD-10-CM | POA: Diagnosis not present

## 2017-07-07 DIAGNOSIS — S199XXA Unspecified injury of neck, initial encounter: Secondary | ICD-10-CM | POA: Diagnosis not present

## 2017-07-07 DIAGNOSIS — S43005A Unspecified dislocation of left shoulder joint, initial encounter: Secondary | ICD-10-CM | POA: Diagnosis not present

## 2017-07-07 DIAGNOSIS — Z23 Encounter for immunization: Secondary | ICD-10-CM | POA: Diagnosis not present

## 2017-07-09 DIAGNOSIS — S43005D Unspecified dislocation of left shoulder joint, subsequent encounter: Secondary | ICD-10-CM | POA: Diagnosis not present

## 2017-07-09 DIAGNOSIS — W19XXXD Unspecified fall, subsequent encounter: Secondary | ICD-10-CM | POA: Diagnosis not present

## 2017-07-09 DIAGNOSIS — R2689 Other abnormalities of gait and mobility: Secondary | ICD-10-CM | POA: Diagnosis not present

## 2017-07-09 DIAGNOSIS — I10 Essential (primary) hypertension: Secondary | ICD-10-CM | POA: Diagnosis not present

## 2017-07-09 DIAGNOSIS — M069 Rheumatoid arthritis, unspecified: Secondary | ICD-10-CM | POA: Diagnosis not present

## 2017-07-09 DIAGNOSIS — G20 Parkinson's disease: Secondary | ICD-10-CM | POA: Diagnosis not present

## 2017-07-11 DIAGNOSIS — G20 Parkinson's disease: Secondary | ICD-10-CM | POA: Diagnosis not present

## 2017-07-11 DIAGNOSIS — W19XXXD Unspecified fall, subsequent encounter: Secondary | ICD-10-CM | POA: Diagnosis not present

## 2017-07-11 DIAGNOSIS — S43005D Unspecified dislocation of left shoulder joint, subsequent encounter: Secondary | ICD-10-CM | POA: Diagnosis not present

## 2017-07-11 DIAGNOSIS — I10 Essential (primary) hypertension: Secondary | ICD-10-CM | POA: Diagnosis not present

## 2017-07-11 DIAGNOSIS — J3089 Other allergic rhinitis: Secondary | ICD-10-CM | POA: Diagnosis not present

## 2017-07-11 DIAGNOSIS — N342 Other urethritis: Secondary | ICD-10-CM | POA: Diagnosis not present

## 2017-07-11 DIAGNOSIS — M069 Rheumatoid arthritis, unspecified: Secondary | ICD-10-CM | POA: Diagnosis not present

## 2017-07-11 DIAGNOSIS — R2681 Unsteadiness on feet: Secondary | ICD-10-CM | POA: Diagnosis not present

## 2017-07-11 DIAGNOSIS — R2689 Other abnormalities of gait and mobility: Secondary | ICD-10-CM | POA: Diagnosis not present

## 2017-07-13 DIAGNOSIS — I10 Essential (primary) hypertension: Secondary | ICD-10-CM | POA: Diagnosis not present

## 2017-07-13 DIAGNOSIS — R2689 Other abnormalities of gait and mobility: Secondary | ICD-10-CM | POA: Diagnosis not present

## 2017-07-13 DIAGNOSIS — S43005D Unspecified dislocation of left shoulder joint, subsequent encounter: Secondary | ICD-10-CM | POA: Diagnosis not present

## 2017-07-13 DIAGNOSIS — W19XXXD Unspecified fall, subsequent encounter: Secondary | ICD-10-CM | POA: Diagnosis not present

## 2017-07-13 DIAGNOSIS — G20 Parkinson's disease: Secondary | ICD-10-CM | POA: Diagnosis not present

## 2017-07-13 DIAGNOSIS — M069 Rheumatoid arthritis, unspecified: Secondary | ICD-10-CM | POA: Diagnosis not present

## 2017-07-16 DIAGNOSIS — G20 Parkinson's disease: Secondary | ICD-10-CM | POA: Diagnosis not present

## 2017-07-16 DIAGNOSIS — W19XXXD Unspecified fall, subsequent encounter: Secondary | ICD-10-CM | POA: Diagnosis not present

## 2017-07-16 DIAGNOSIS — S43005D Unspecified dislocation of left shoulder joint, subsequent encounter: Secondary | ICD-10-CM | POA: Diagnosis not present

## 2017-07-16 DIAGNOSIS — I10 Essential (primary) hypertension: Secondary | ICD-10-CM | POA: Diagnosis not present

## 2017-07-16 DIAGNOSIS — N390 Urinary tract infection, site not specified: Secondary | ICD-10-CM | POA: Diagnosis not present

## 2017-07-16 DIAGNOSIS — R2689 Other abnormalities of gait and mobility: Secondary | ICD-10-CM | POA: Diagnosis not present

## 2017-07-16 DIAGNOSIS — M069 Rheumatoid arthritis, unspecified: Secondary | ICD-10-CM | POA: Diagnosis not present

## 2017-07-16 DIAGNOSIS — R300 Dysuria: Secondary | ICD-10-CM | POA: Diagnosis not present

## 2017-07-16 IMAGING — DX DG CHEST 2V
2 series · 2 of 2 positions shown · non-contrast
Comparison: None.

CLINICAL DATA: Initial evaluation for acute weakness, chest pain,
dry cough, occasional shortness of breath.

EXAM:
CHEST  2 VIEW

[chest lat]
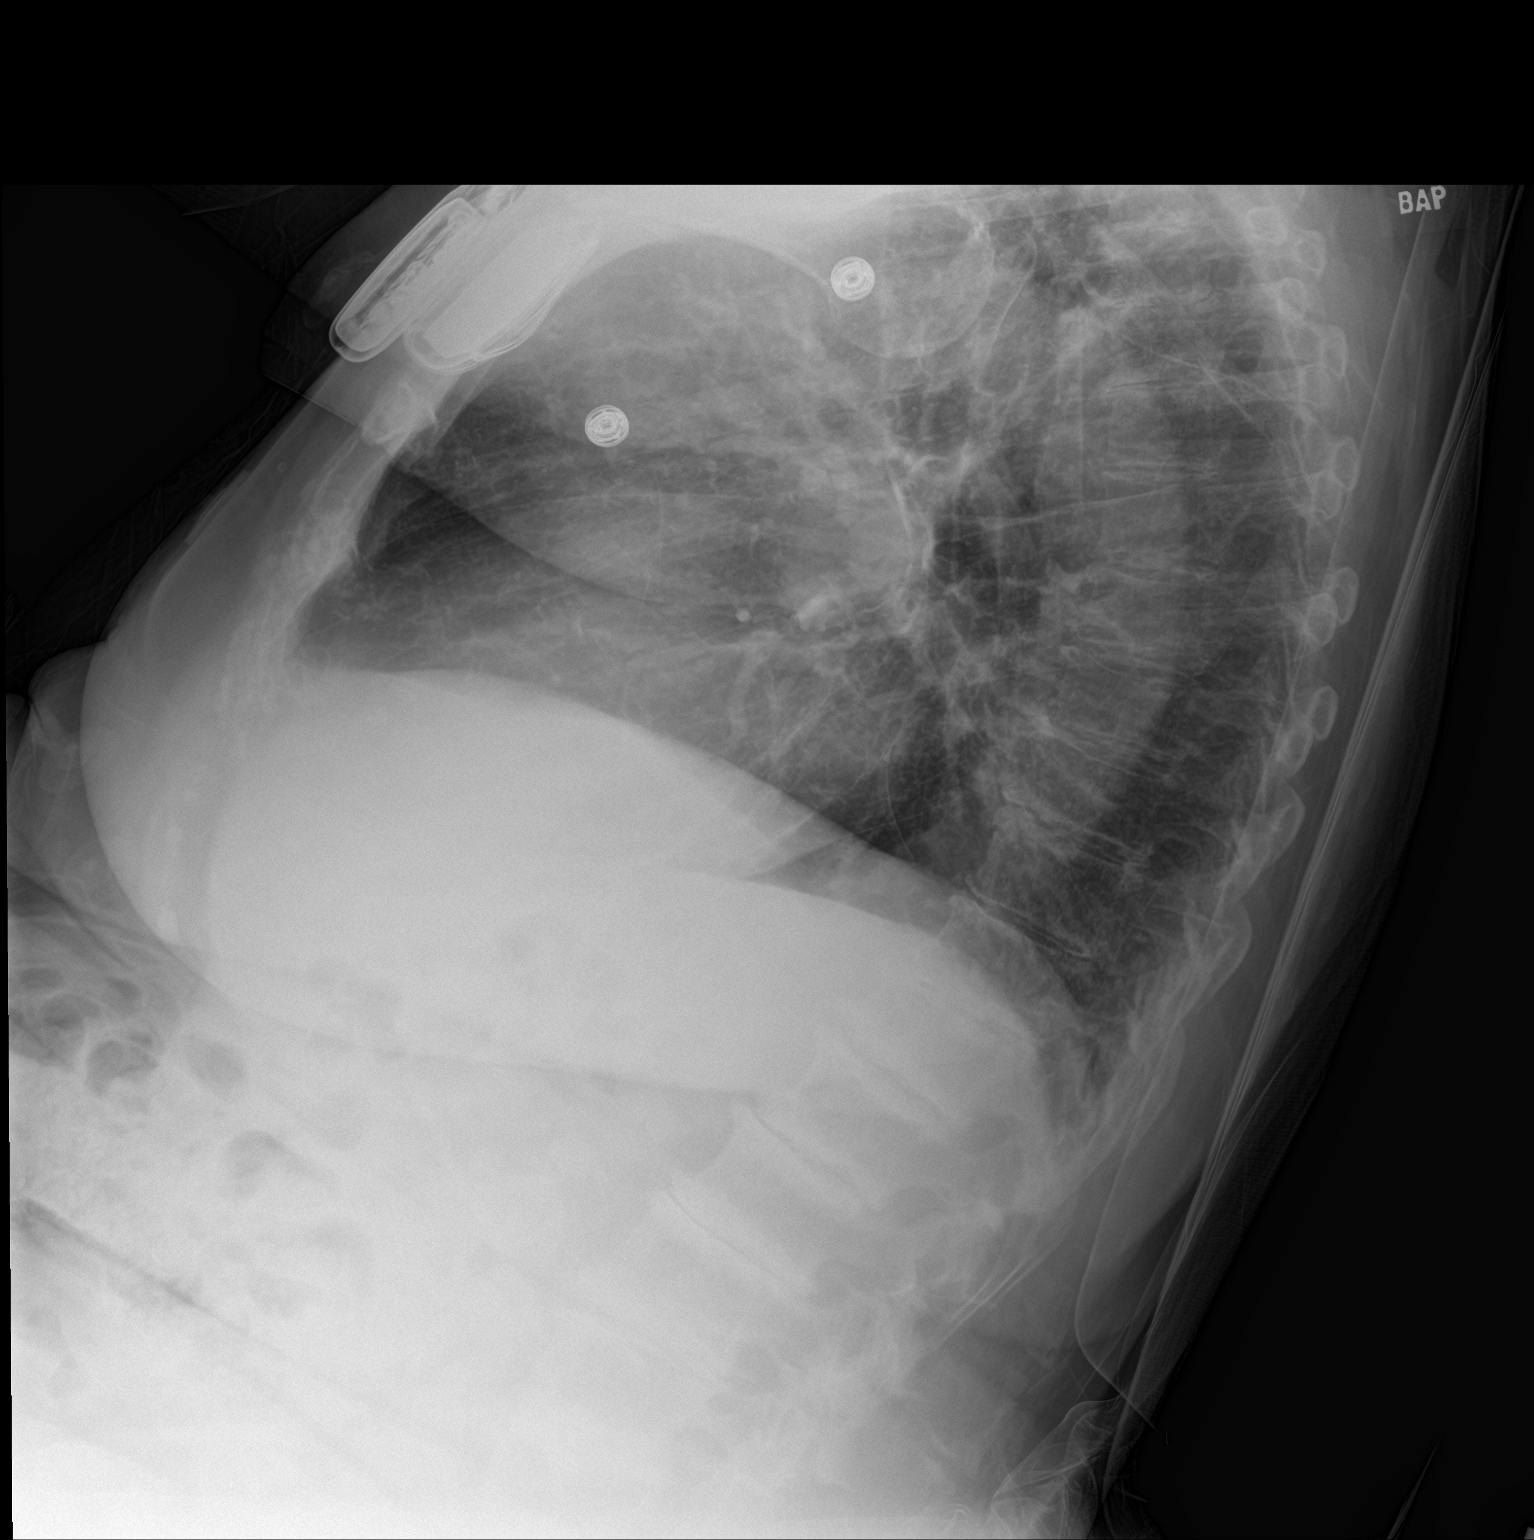

[chest ap]
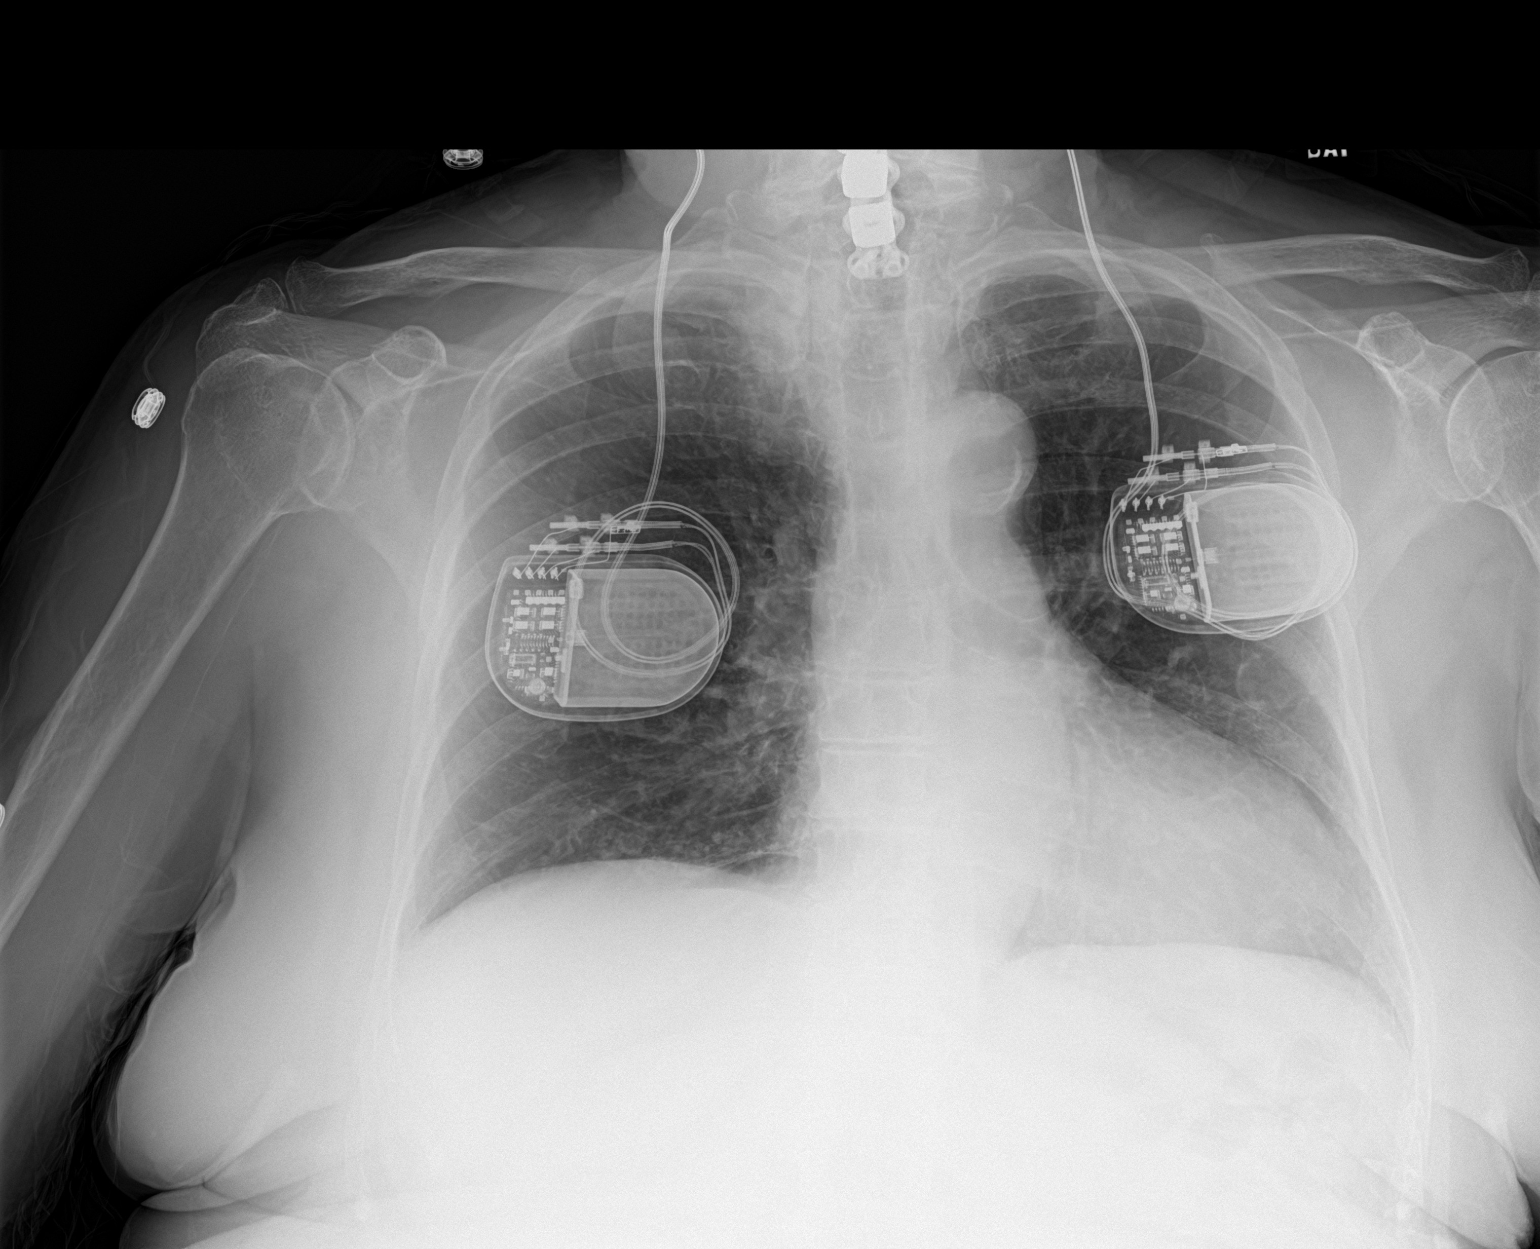

[2 of 2 positions shown; findings below may reference images not displayed]

FINDINGS: Bilateral generator devices overlie both the right and left chest.
Electrodes course cephalad into the neck.

Mild cardiomegaly. Mediastinal silhouette within normal limits.
Aortic atherosclerosis noted.

Lungs are normally inflated. No focal infiltrate, pulmonary edema,
or pleural effusion. No pneumothorax.

ACDF noted within the cervical spine.  No acute osseous abnormality.
IMPRESSION: 1. No active cardiopulmonary disease.
2. Mild cardiomegaly without pulmonary edema.
3. Aortic atherosclerosis.

## 2017-07-17 DIAGNOSIS — G20 Parkinson's disease: Secondary | ICD-10-CM | POA: Diagnosis not present

## 2017-07-17 DIAGNOSIS — W1789XA Other fall from one level to another, initial encounter: Secondary | ICD-10-CM | POA: Diagnosis not present

## 2017-07-18 DIAGNOSIS — I10 Essential (primary) hypertension: Secondary | ICD-10-CM | POA: Diagnosis not present

## 2017-07-18 DIAGNOSIS — S43005D Unspecified dislocation of left shoulder joint, subsequent encounter: Secondary | ICD-10-CM | POA: Diagnosis not present

## 2017-07-18 DIAGNOSIS — W19XXXD Unspecified fall, subsequent encounter: Secondary | ICD-10-CM | POA: Diagnosis not present

## 2017-07-18 DIAGNOSIS — M069 Rheumatoid arthritis, unspecified: Secondary | ICD-10-CM | POA: Diagnosis not present

## 2017-07-18 DIAGNOSIS — R2689 Other abnormalities of gait and mobility: Secondary | ICD-10-CM | POA: Diagnosis not present

## 2017-07-18 DIAGNOSIS — G20 Parkinson's disease: Secondary | ICD-10-CM | POA: Diagnosis not present

## 2017-07-20 DIAGNOSIS — M069 Rheumatoid arthritis, unspecified: Secondary | ICD-10-CM | POA: Diagnosis not present

## 2017-07-20 DIAGNOSIS — S43005D Unspecified dislocation of left shoulder joint, subsequent encounter: Secondary | ICD-10-CM | POA: Diagnosis not present

## 2017-07-20 DIAGNOSIS — R2689 Other abnormalities of gait and mobility: Secondary | ICD-10-CM | POA: Diagnosis not present

## 2017-07-20 DIAGNOSIS — I10 Essential (primary) hypertension: Secondary | ICD-10-CM | POA: Diagnosis not present

## 2017-07-20 DIAGNOSIS — W19XXXD Unspecified fall, subsequent encounter: Secondary | ICD-10-CM | POA: Diagnosis not present

## 2017-07-20 DIAGNOSIS — G20 Parkinson's disease: Secondary | ICD-10-CM | POA: Diagnosis not present

## 2017-07-23 DIAGNOSIS — R32 Unspecified urinary incontinence: Secondary | ICD-10-CM | POA: Diagnosis not present

## 2017-07-23 DIAGNOSIS — S43005D Unspecified dislocation of left shoulder joint, subsequent encounter: Secondary | ICD-10-CM | POA: Diagnosis not present

## 2017-07-23 DIAGNOSIS — I10 Essential (primary) hypertension: Secondary | ICD-10-CM | POA: Diagnosis not present

## 2017-07-23 DIAGNOSIS — W19XXXD Unspecified fall, subsequent encounter: Secondary | ICD-10-CM | POA: Diagnosis not present

## 2017-07-23 DIAGNOSIS — G20 Parkinson's disease: Secondary | ICD-10-CM | POA: Diagnosis not present

## 2017-07-23 DIAGNOSIS — N183 Chronic kidney disease, stage 3 (moderate): Secondary | ICD-10-CM | POA: Diagnosis not present

## 2017-07-23 DIAGNOSIS — M069 Rheumatoid arthritis, unspecified: Secondary | ICD-10-CM | POA: Diagnosis not present

## 2017-07-23 DIAGNOSIS — R2689 Other abnormalities of gait and mobility: Secondary | ICD-10-CM | POA: Diagnosis not present

## 2017-07-25 DIAGNOSIS — Z79899 Other long term (current) drug therapy: Secondary | ICD-10-CM | POA: Diagnosis not present

## 2017-07-25 DIAGNOSIS — Z9689 Presence of other specified functional implants: Secondary | ICD-10-CM | POA: Diagnosis not present

## 2017-07-25 DIAGNOSIS — G4752 REM sleep behavior disorder: Secondary | ICD-10-CM | POA: Diagnosis not present

## 2017-07-25 DIAGNOSIS — R296 Repeated falls: Secondary | ICD-10-CM | POA: Diagnosis not present

## 2017-07-25 DIAGNOSIS — R490 Dysphonia: Secondary | ICD-10-CM | POA: Diagnosis not present

## 2017-07-25 DIAGNOSIS — G20 Parkinson's disease: Secondary | ICD-10-CM | POA: Diagnosis not present

## 2017-07-25 DIAGNOSIS — J328 Other chronic sinusitis: Secondary | ICD-10-CM | POA: Diagnosis not present

## 2017-07-26 DIAGNOSIS — R2689 Other abnormalities of gait and mobility: Secondary | ICD-10-CM | POA: Diagnosis not present

## 2017-07-26 DIAGNOSIS — S43005D Unspecified dislocation of left shoulder joint, subsequent encounter: Secondary | ICD-10-CM | POA: Diagnosis not present

## 2017-07-26 DIAGNOSIS — I10 Essential (primary) hypertension: Secondary | ICD-10-CM | POA: Diagnosis not present

## 2017-07-26 DIAGNOSIS — M069 Rheumatoid arthritis, unspecified: Secondary | ICD-10-CM | POA: Diagnosis not present

## 2017-07-26 DIAGNOSIS — G20 Parkinson's disease: Secondary | ICD-10-CM | POA: Diagnosis not present

## 2017-07-26 DIAGNOSIS — W19XXXD Unspecified fall, subsequent encounter: Secondary | ICD-10-CM | POA: Diagnosis not present

## 2017-07-29 DIAGNOSIS — M069 Rheumatoid arthritis, unspecified: Secondary | ICD-10-CM | POA: Diagnosis not present

## 2017-07-29 DIAGNOSIS — W19XXXD Unspecified fall, subsequent encounter: Secondary | ICD-10-CM | POA: Diagnosis not present

## 2017-07-29 DIAGNOSIS — S43005D Unspecified dislocation of left shoulder joint, subsequent encounter: Secondary | ICD-10-CM | POA: Diagnosis not present

## 2017-07-29 DIAGNOSIS — G20 Parkinson's disease: Secondary | ICD-10-CM | POA: Diagnosis not present

## 2017-07-29 DIAGNOSIS — R2689 Other abnormalities of gait and mobility: Secondary | ICD-10-CM | POA: Diagnosis not present

## 2017-07-29 DIAGNOSIS — I10 Essential (primary) hypertension: Secondary | ICD-10-CM | POA: Diagnosis not present

## 2017-07-30 DIAGNOSIS — R2689 Other abnormalities of gait and mobility: Secondary | ICD-10-CM | POA: Diagnosis not present

## 2017-07-30 DIAGNOSIS — M069 Rheumatoid arthritis, unspecified: Secondary | ICD-10-CM | POA: Diagnosis not present

## 2017-07-30 DIAGNOSIS — I10 Essential (primary) hypertension: Secondary | ICD-10-CM | POA: Diagnosis not present

## 2017-07-30 DIAGNOSIS — G20 Parkinson's disease: Secondary | ICD-10-CM | POA: Diagnosis not present

## 2017-07-30 DIAGNOSIS — W19XXXD Unspecified fall, subsequent encounter: Secondary | ICD-10-CM | POA: Diagnosis not present

## 2017-07-30 DIAGNOSIS — S43005D Unspecified dislocation of left shoulder joint, subsequent encounter: Secondary | ICD-10-CM | POA: Diagnosis not present

## 2017-08-03 DIAGNOSIS — S43005D Unspecified dislocation of left shoulder joint, subsequent encounter: Secondary | ICD-10-CM | POA: Diagnosis not present

## 2017-08-03 DIAGNOSIS — W19XXXD Unspecified fall, subsequent encounter: Secondary | ICD-10-CM | POA: Diagnosis not present

## 2017-08-03 DIAGNOSIS — R2689 Other abnormalities of gait and mobility: Secondary | ICD-10-CM | POA: Diagnosis not present

## 2017-08-03 DIAGNOSIS — M069 Rheumatoid arthritis, unspecified: Secondary | ICD-10-CM | POA: Diagnosis not present

## 2017-08-03 DIAGNOSIS — I10 Essential (primary) hypertension: Secondary | ICD-10-CM | POA: Diagnosis not present

## 2017-08-03 DIAGNOSIS — G20 Parkinson's disease: Secondary | ICD-10-CM | POA: Diagnosis not present

## 2017-08-06 DIAGNOSIS — W19XXXD Unspecified fall, subsequent encounter: Secondary | ICD-10-CM | POA: Diagnosis not present

## 2017-08-06 DIAGNOSIS — S43005D Unspecified dislocation of left shoulder joint, subsequent encounter: Secondary | ICD-10-CM | POA: Diagnosis not present

## 2017-08-06 DIAGNOSIS — I10 Essential (primary) hypertension: Secondary | ICD-10-CM | POA: Diagnosis not present

## 2017-08-06 DIAGNOSIS — R2689 Other abnormalities of gait and mobility: Secondary | ICD-10-CM | POA: Diagnosis not present

## 2017-08-06 DIAGNOSIS — G20 Parkinson's disease: Secondary | ICD-10-CM | POA: Diagnosis not present

## 2017-08-06 DIAGNOSIS — M069 Rheumatoid arthritis, unspecified: Secondary | ICD-10-CM | POA: Diagnosis not present

## 2017-08-07 DIAGNOSIS — W19XXXD Unspecified fall, subsequent encounter: Secondary | ICD-10-CM | POA: Diagnosis not present

## 2017-08-07 DIAGNOSIS — M069 Rheumatoid arthritis, unspecified: Secondary | ICD-10-CM | POA: Diagnosis not present

## 2017-08-07 DIAGNOSIS — R2689 Other abnormalities of gait and mobility: Secondary | ICD-10-CM | POA: Diagnosis not present

## 2017-08-07 DIAGNOSIS — G20 Parkinson's disease: Secondary | ICD-10-CM | POA: Diagnosis not present

## 2017-08-07 DIAGNOSIS — I10 Essential (primary) hypertension: Secondary | ICD-10-CM | POA: Diagnosis not present

## 2017-08-07 DIAGNOSIS — S43005D Unspecified dislocation of left shoulder joint, subsequent encounter: Secondary | ICD-10-CM | POA: Diagnosis not present

## 2017-08-14 DIAGNOSIS — M069 Rheumatoid arthritis, unspecified: Secondary | ICD-10-CM | POA: Diagnosis not present

## 2017-08-14 DIAGNOSIS — G20 Parkinson's disease: Secondary | ICD-10-CM | POA: Diagnosis not present

## 2017-08-14 DIAGNOSIS — W19XXXD Unspecified fall, subsequent encounter: Secondary | ICD-10-CM | POA: Diagnosis not present

## 2017-08-14 DIAGNOSIS — R2689 Other abnormalities of gait and mobility: Secondary | ICD-10-CM | POA: Diagnosis not present

## 2017-08-14 DIAGNOSIS — I10 Essential (primary) hypertension: Secondary | ICD-10-CM | POA: Diagnosis not present

## 2017-08-14 DIAGNOSIS — S43005D Unspecified dislocation of left shoulder joint, subsequent encounter: Secondary | ICD-10-CM | POA: Diagnosis not present

## 2017-08-15 DIAGNOSIS — R600 Localized edema: Secondary | ICD-10-CM | POA: Diagnosis not present

## 2017-08-15 DIAGNOSIS — M25511 Pain in right shoulder: Secondary | ICD-10-CM | POA: Diagnosis not present

## 2017-08-15 DIAGNOSIS — S43005D Unspecified dislocation of left shoulder joint, subsequent encounter: Secondary | ICD-10-CM | POA: Diagnosis not present

## 2017-08-15 DIAGNOSIS — R2689 Other abnormalities of gait and mobility: Secondary | ICD-10-CM | POA: Diagnosis not present

## 2017-08-15 DIAGNOSIS — G20 Parkinson's disease: Secondary | ICD-10-CM | POA: Diagnosis not present

## 2017-08-15 DIAGNOSIS — R296 Repeated falls: Secondary | ICD-10-CM | POA: Diagnosis not present

## 2017-08-15 DIAGNOSIS — M069 Rheumatoid arthritis, unspecified: Secondary | ICD-10-CM | POA: Diagnosis not present

## 2017-08-15 DIAGNOSIS — I10 Essential (primary) hypertension: Secondary | ICD-10-CM | POA: Diagnosis not present

## 2017-08-15 DIAGNOSIS — R5381 Other malaise: Secondary | ICD-10-CM | POA: Diagnosis not present

## 2017-08-15 DIAGNOSIS — W19XXXD Unspecified fall, subsequent encounter: Secondary | ICD-10-CM | POA: Diagnosis not present

## 2017-08-17 DIAGNOSIS — M069 Rheumatoid arthritis, unspecified: Secondary | ICD-10-CM | POA: Diagnosis not present

## 2017-08-17 DIAGNOSIS — S43005D Unspecified dislocation of left shoulder joint, subsequent encounter: Secondary | ICD-10-CM | POA: Diagnosis not present

## 2017-08-17 DIAGNOSIS — W19XXXD Unspecified fall, subsequent encounter: Secondary | ICD-10-CM | POA: Diagnosis not present

## 2017-08-17 DIAGNOSIS — R2689 Other abnormalities of gait and mobility: Secondary | ICD-10-CM | POA: Diagnosis not present

## 2017-08-17 DIAGNOSIS — G20 Parkinson's disease: Secondary | ICD-10-CM | POA: Diagnosis not present

## 2017-08-17 DIAGNOSIS — I10 Essential (primary) hypertension: Secondary | ICD-10-CM | POA: Diagnosis not present

## 2017-08-19 DIAGNOSIS — M1612 Unilateral primary osteoarthritis, left hip: Secondary | ICD-10-CM | POA: Diagnosis not present

## 2017-08-19 DIAGNOSIS — M1611 Unilateral primary osteoarthritis, right hip: Secondary | ICD-10-CM | POA: Diagnosis not present

## 2017-08-20 DIAGNOSIS — Z91048 Other nonmedicinal substance allergy status: Secondary | ICD-10-CM | POA: Diagnosis not present

## 2017-08-20 DIAGNOSIS — M25511 Pain in right shoulder: Secondary | ICD-10-CM | POA: Diagnosis not present

## 2017-08-20 DIAGNOSIS — S4991XA Unspecified injury of right shoulder and upper arm, initial encounter: Secondary | ICD-10-CM | POA: Diagnosis not present

## 2017-08-20 DIAGNOSIS — S4990XA Unspecified injury of shoulder and upper arm, unspecified arm, initial encounter: Secondary | ICD-10-CM | POA: Diagnosis not present

## 2017-08-20 DIAGNOSIS — Z8739 Personal history of other diseases of the musculoskeletal system and connective tissue: Secondary | ICD-10-CM | POA: Diagnosis not present

## 2017-08-20 DIAGNOSIS — Z91040 Latex allergy status: Secondary | ICD-10-CM | POA: Diagnosis not present

## 2017-08-20 DIAGNOSIS — Z9889 Other specified postprocedural states: Secondary | ICD-10-CM | POA: Diagnosis not present

## 2017-08-20 DIAGNOSIS — X58XXXA Exposure to other specified factors, initial encounter: Secondary | ICD-10-CM | POA: Diagnosis not present

## 2017-08-20 DIAGNOSIS — S43401A Unspecified sprain of right shoulder joint, initial encounter: Secondary | ICD-10-CM | POA: Diagnosis not present

## 2017-08-20 DIAGNOSIS — Z885 Allergy status to narcotic agent status: Secondary | ICD-10-CM | POA: Diagnosis not present

## 2017-08-20 DIAGNOSIS — R2689 Other abnormalities of gait and mobility: Secondary | ICD-10-CM | POA: Diagnosis not present

## 2017-08-20 DIAGNOSIS — E785 Hyperlipidemia, unspecified: Secondary | ICD-10-CM | POA: Diagnosis not present

## 2017-08-20 DIAGNOSIS — M069 Rheumatoid arthritis, unspecified: Secondary | ICD-10-CM | POA: Diagnosis not present

## 2017-08-20 DIAGNOSIS — W19XXXD Unspecified fall, subsequent encounter: Secondary | ICD-10-CM | POA: Diagnosis not present

## 2017-08-20 DIAGNOSIS — Z85828 Personal history of other malignant neoplasm of skin: Secondary | ICD-10-CM | POA: Diagnosis not present

## 2017-08-20 DIAGNOSIS — G20 Parkinson's disease: Secondary | ICD-10-CM | POA: Diagnosis not present

## 2017-08-20 DIAGNOSIS — I10 Essential (primary) hypertension: Secondary | ICD-10-CM | POA: Diagnosis not present

## 2017-08-20 DIAGNOSIS — S43005D Unspecified dislocation of left shoulder joint, subsequent encounter: Secondary | ICD-10-CM | POA: Diagnosis not present

## 2017-08-20 DIAGNOSIS — Y998 Other external cause status: Secondary | ICD-10-CM | POA: Diagnosis not present

## 2017-08-22 DIAGNOSIS — M069 Rheumatoid arthritis, unspecified: Secondary | ICD-10-CM | POA: Diagnosis not present

## 2017-08-22 DIAGNOSIS — R600 Localized edema: Secondary | ICD-10-CM | POA: Diagnosis not present

## 2017-08-22 DIAGNOSIS — R233 Spontaneous ecchymoses: Secondary | ICD-10-CM | POA: Diagnosis not present

## 2017-08-22 DIAGNOSIS — S43005D Unspecified dislocation of left shoulder joint, subsequent encounter: Secondary | ICD-10-CM | POA: Diagnosis not present

## 2017-08-22 DIAGNOSIS — M25511 Pain in right shoulder: Secondary | ICD-10-CM | POA: Diagnosis not present

## 2017-08-22 DIAGNOSIS — R296 Repeated falls: Secondary | ICD-10-CM | POA: Diagnosis not present

## 2017-08-22 DIAGNOSIS — G20 Parkinson's disease: Secondary | ICD-10-CM | POA: Diagnosis not present

## 2017-08-22 DIAGNOSIS — I10 Essential (primary) hypertension: Secondary | ICD-10-CM | POA: Diagnosis not present

## 2017-08-22 DIAGNOSIS — W19XXXD Unspecified fall, subsequent encounter: Secondary | ICD-10-CM | POA: Diagnosis not present

## 2017-08-22 DIAGNOSIS — R2689 Other abnormalities of gait and mobility: Secondary | ICD-10-CM | POA: Diagnosis not present

## 2017-08-23 DIAGNOSIS — M79674 Pain in right toe(s): Secondary | ICD-10-CM | POA: Diagnosis not present

## 2017-08-23 DIAGNOSIS — M79675 Pain in left toe(s): Secondary | ICD-10-CM | POA: Diagnosis not present

## 2017-08-23 DIAGNOSIS — B351 Tinea unguium: Secondary | ICD-10-CM | POA: Diagnosis not present

## 2017-08-24 DIAGNOSIS — I10 Essential (primary) hypertension: Secondary | ICD-10-CM | POA: Diagnosis not present

## 2017-08-24 DIAGNOSIS — M069 Rheumatoid arthritis, unspecified: Secondary | ICD-10-CM | POA: Diagnosis not present

## 2017-08-24 DIAGNOSIS — R2689 Other abnormalities of gait and mobility: Secondary | ICD-10-CM | POA: Diagnosis not present

## 2017-08-24 DIAGNOSIS — G20 Parkinson's disease: Secondary | ICD-10-CM | POA: Diagnosis not present

## 2017-08-24 DIAGNOSIS — W19XXXD Unspecified fall, subsequent encounter: Secondary | ICD-10-CM | POA: Diagnosis not present

## 2017-08-24 DIAGNOSIS — S43005D Unspecified dislocation of left shoulder joint, subsequent encounter: Secondary | ICD-10-CM | POA: Diagnosis not present

## 2017-08-25 DIAGNOSIS — S43014A Anterior dislocation of right humerus, initial encounter: Secondary | ICD-10-CM | POA: Diagnosis not present

## 2017-08-25 DIAGNOSIS — S43005D Unspecified dislocation of left shoulder joint, subsequent encounter: Secondary | ICD-10-CM | POA: Diagnosis not present

## 2017-08-25 DIAGNOSIS — I10 Essential (primary) hypertension: Secondary | ICD-10-CM | POA: Diagnosis not present

## 2017-08-25 DIAGNOSIS — M25511 Pain in right shoulder: Secondary | ICD-10-CM | POA: Diagnosis not present

## 2017-08-25 DIAGNOSIS — W19XXXD Unspecified fall, subsequent encounter: Secondary | ICD-10-CM | POA: Diagnosis not present

## 2017-08-25 DIAGNOSIS — R2689 Other abnormalities of gait and mobility: Secondary | ICD-10-CM | POA: Diagnosis not present

## 2017-08-25 DIAGNOSIS — M069 Rheumatoid arthritis, unspecified: Secondary | ICD-10-CM | POA: Diagnosis not present

## 2017-08-25 DIAGNOSIS — T148XXA Other injury of unspecified body region, initial encounter: Secondary | ICD-10-CM | POA: Diagnosis not present

## 2017-08-25 DIAGNOSIS — G20 Parkinson's disease: Secondary | ICD-10-CM | POA: Diagnosis not present

## 2017-08-25 DIAGNOSIS — S43004A Unspecified dislocation of right shoulder joint, initial encounter: Secondary | ICD-10-CM | POA: Diagnosis not present

## 2017-08-26 DIAGNOSIS — G8911 Acute pain due to trauma: Secondary | ICD-10-CM | POA: Diagnosis not present

## 2017-08-26 DIAGNOSIS — S43014A Anterior dislocation of right humerus, initial encounter: Secondary | ICD-10-CM | POA: Diagnosis not present

## 2017-08-26 DIAGNOSIS — S4990XA Unspecified injury of shoulder and upper arm, unspecified arm, initial encounter: Secondary | ICD-10-CM | POA: Diagnosis not present

## 2017-08-26 DIAGNOSIS — S43004A Unspecified dislocation of right shoulder joint, initial encounter: Secondary | ICD-10-CM | POA: Diagnosis not present

## 2017-08-29 DIAGNOSIS — R2689 Other abnormalities of gait and mobility: Secondary | ICD-10-CM | POA: Diagnosis not present

## 2017-08-29 DIAGNOSIS — Z789 Other specified health status: Secondary | ICD-10-CM | POA: Diagnosis not present

## 2017-08-29 DIAGNOSIS — I10 Essential (primary) hypertension: Secondary | ICD-10-CM | POA: Diagnosis not present

## 2017-08-29 DIAGNOSIS — S43005D Unspecified dislocation of left shoulder joint, subsequent encounter: Secondary | ICD-10-CM | POA: Diagnosis not present

## 2017-08-29 DIAGNOSIS — R296 Repeated falls: Secondary | ICD-10-CM | POA: Diagnosis not present

## 2017-08-29 DIAGNOSIS — G20 Parkinson's disease: Secondary | ICD-10-CM | POA: Diagnosis not present

## 2017-08-29 DIAGNOSIS — W19XXXD Unspecified fall, subsequent encounter: Secondary | ICD-10-CM | POA: Diagnosis not present

## 2017-08-29 DIAGNOSIS — M069 Rheumatoid arthritis, unspecified: Secondary | ICD-10-CM | POA: Diagnosis not present

## 2017-08-29 DIAGNOSIS — S43014A Anterior dislocation of right humerus, initial encounter: Secondary | ICD-10-CM | POA: Diagnosis not present

## 2017-08-30 DIAGNOSIS — S81812A Laceration without foreign body, left lower leg, initial encounter: Secondary | ICD-10-CM | POA: Diagnosis not present

## 2017-08-30 DIAGNOSIS — S8990XA Unspecified injury of unspecified lower leg, initial encounter: Secondary | ICD-10-CM | POA: Diagnosis not present

## 2017-08-30 DIAGNOSIS — T148XXA Other injury of unspecified body region, initial encounter: Secondary | ICD-10-CM | POA: Diagnosis not present

## 2017-08-30 DIAGNOSIS — S8992XA Unspecified injury of left lower leg, initial encounter: Secondary | ICD-10-CM | POA: Diagnosis not present

## 2017-08-30 DIAGNOSIS — G8911 Acute pain due to trauma: Secondary | ICD-10-CM | POA: Diagnosis not present

## 2017-08-31 DIAGNOSIS — I10 Essential (primary) hypertension: Secondary | ICD-10-CM | POA: Diagnosis not present

## 2017-08-31 DIAGNOSIS — R2689 Other abnormalities of gait and mobility: Secondary | ICD-10-CM | POA: Diagnosis not present

## 2017-08-31 DIAGNOSIS — M069 Rheumatoid arthritis, unspecified: Secondary | ICD-10-CM | POA: Diagnosis not present

## 2017-08-31 DIAGNOSIS — G20 Parkinson's disease: Secondary | ICD-10-CM | POA: Diagnosis not present

## 2017-08-31 DIAGNOSIS — W19XXXD Unspecified fall, subsequent encounter: Secondary | ICD-10-CM | POA: Diagnosis not present

## 2017-08-31 DIAGNOSIS — S43005D Unspecified dislocation of left shoulder joint, subsequent encounter: Secondary | ICD-10-CM | POA: Diagnosis not present

## 2017-09-03 DIAGNOSIS — I10 Essential (primary) hypertension: Secondary | ICD-10-CM | POA: Diagnosis not present

## 2017-09-03 DIAGNOSIS — G20 Parkinson's disease: Secondary | ICD-10-CM | POA: Diagnosis not present

## 2017-09-03 DIAGNOSIS — S43005D Unspecified dislocation of left shoulder joint, subsequent encounter: Secondary | ICD-10-CM | POA: Diagnosis not present

## 2017-09-03 DIAGNOSIS — W19XXXD Unspecified fall, subsequent encounter: Secondary | ICD-10-CM | POA: Diagnosis not present

## 2017-09-03 DIAGNOSIS — M069 Rheumatoid arthritis, unspecified: Secondary | ICD-10-CM | POA: Diagnosis not present

## 2017-09-03 DIAGNOSIS — R2689 Other abnormalities of gait and mobility: Secondary | ICD-10-CM | POA: Diagnosis not present

## 2017-09-05 DIAGNOSIS — G20 Parkinson's disease: Secondary | ICD-10-CM | POA: Diagnosis not present

## 2017-09-05 DIAGNOSIS — W19XXXD Unspecified fall, subsequent encounter: Secondary | ICD-10-CM | POA: Diagnosis not present

## 2017-09-05 DIAGNOSIS — R2689 Other abnormalities of gait and mobility: Secondary | ICD-10-CM | POA: Diagnosis not present

## 2017-09-05 DIAGNOSIS — M069 Rheumatoid arthritis, unspecified: Secondary | ICD-10-CM | POA: Diagnosis not present

## 2017-09-05 DIAGNOSIS — I10 Essential (primary) hypertension: Secondary | ICD-10-CM | POA: Diagnosis not present

## 2017-09-05 DIAGNOSIS — S43014A Anterior dislocation of right humerus, initial encounter: Secondary | ICD-10-CM | POA: Diagnosis not present

## 2017-09-05 DIAGNOSIS — S43005D Unspecified dislocation of left shoulder joint, subsequent encounter: Secondary | ICD-10-CM | POA: Diagnosis not present

## 2017-09-05 DIAGNOSIS — R296 Repeated falls: Secondary | ICD-10-CM | POA: Diagnosis not present

## 2017-09-06 DIAGNOSIS — M25311 Other instability, right shoulder: Secondary | ICD-10-CM | POA: Diagnosis not present

## 2017-09-06 DIAGNOSIS — M25312 Other instability, left shoulder: Secondary | ICD-10-CM | POA: Diagnosis not present

## 2017-09-07 DIAGNOSIS — R2689 Other abnormalities of gait and mobility: Secondary | ICD-10-CM | POA: Diagnosis not present

## 2017-09-07 DIAGNOSIS — S43005D Unspecified dislocation of left shoulder joint, subsequent encounter: Secondary | ICD-10-CM | POA: Diagnosis not present

## 2017-09-07 DIAGNOSIS — G20 Parkinson's disease: Secondary | ICD-10-CM | POA: Diagnosis not present

## 2017-09-07 DIAGNOSIS — M069 Rheumatoid arthritis, unspecified: Secondary | ICD-10-CM | POA: Diagnosis not present

## 2017-09-07 DIAGNOSIS — W19XXXD Unspecified fall, subsequent encounter: Secondary | ICD-10-CM | POA: Diagnosis not present

## 2017-09-07 DIAGNOSIS — I10 Essential (primary) hypertension: Secondary | ICD-10-CM | POA: Diagnosis not present

## 2017-09-10 DIAGNOSIS — W19XXXD Unspecified fall, subsequent encounter: Secondary | ICD-10-CM | POA: Diagnosis not present

## 2017-09-10 DIAGNOSIS — M069 Rheumatoid arthritis, unspecified: Secondary | ICD-10-CM | POA: Diagnosis not present

## 2017-09-10 DIAGNOSIS — I10 Essential (primary) hypertension: Secondary | ICD-10-CM | POA: Diagnosis not present

## 2017-09-10 DIAGNOSIS — S43005D Unspecified dislocation of left shoulder joint, subsequent encounter: Secondary | ICD-10-CM | POA: Diagnosis not present

## 2017-09-10 DIAGNOSIS — G20 Parkinson's disease: Secondary | ICD-10-CM | POA: Diagnosis not present

## 2017-09-10 DIAGNOSIS — R2689 Other abnormalities of gait and mobility: Secondary | ICD-10-CM | POA: Diagnosis not present

## 2017-09-13 DIAGNOSIS — G20 Parkinson's disease: Secondary | ICD-10-CM | POA: Diagnosis not present

## 2017-09-13 DIAGNOSIS — R2689 Other abnormalities of gait and mobility: Secondary | ICD-10-CM | POA: Diagnosis not present

## 2017-09-13 DIAGNOSIS — S43005D Unspecified dislocation of left shoulder joint, subsequent encounter: Secondary | ICD-10-CM | POA: Diagnosis not present

## 2017-09-13 DIAGNOSIS — M069 Rheumatoid arthritis, unspecified: Secondary | ICD-10-CM | POA: Diagnosis not present

## 2017-09-13 DIAGNOSIS — I10 Essential (primary) hypertension: Secondary | ICD-10-CM | POA: Diagnosis not present

## 2017-09-13 DIAGNOSIS — W19XXXD Unspecified fall, subsequent encounter: Secondary | ICD-10-CM | POA: Diagnosis not present

## 2017-12-11 LAB — BASIC METABOLIC PANEL
BUN: 25 — AB (ref 4–21)
Creatinine: 1 (ref 0.5–1.1)
Glucose: 89
POTASSIUM: 4.4 (ref 3.4–5.3)
SODIUM: 140 (ref 137–147)

## 2017-12-11 LAB — HEPATIC FUNCTION PANEL
AST: 7 — AB (ref 13–35)
Alkaline Phosphatase: 100 (ref 25–125)

## 2017-12-11 LAB — TSH: TSH: 0.29 — AB (ref 0.41–5.90)

## 2017-12-13 ENCOUNTER — Telehealth: Payer: Self-pay | Admitting: Family Medicine

## 2017-12-13 NOTE — Telephone Encounter (Signed)
Call pt: received labs Shannon Vincent.  Thyroid level off. Decrease dose by taking half of thyroid pill on Saturday and whole tab all other 6 days of week.  Then repeat each week. F/U with Dr. Sheppard Coil in 6 weeks as she hasn't been seen in almost a year. Dr. Loni Muse can recheck her thyroid level at that time.

## 2017-12-14 NOTE — Telephone Encounter (Signed)
Left a brief voicemail for patient to return call back to office regarding covering provider's note & change in thyroid med regimen.

## 2017-12-17 NOTE — Telephone Encounter (Signed)
Left second VM, callback information provided.

## 2017-12-18 ENCOUNTER — Encounter: Payer: Self-pay | Admitting: Osteopathic Medicine

## 2017-12-18 LAB — SEDIMENTATION RATE: Sed Rate: 21

## 2017-12-18 LAB — HEPATITIS B SURFACE ANTIGEN: Hep B Surface Ab, Qual: NONREACTIVE

## 2018-12-16 IMAGING — US US EXTREM LOW VENOUS*R*
1 series · 14 of 24 positions shown · non-contrast
Comparison: None.

CLINICAL DATA: Right lower extremity erythema and swelling for 3
weeks

EXAM:
RIGHT LOWER EXTREMITY VENOUS DUPLEX ULTRASOUND
TECHNIQUE: Doppler venous assessment of the right lower extremity deep venous
system was performed, including characterization of spectral flow,
compressibility, and phasicity.

[Series 1: us extrem low venous*right* · 0.09mm/px · 14 of 37 slices shown]
[im 1/37]
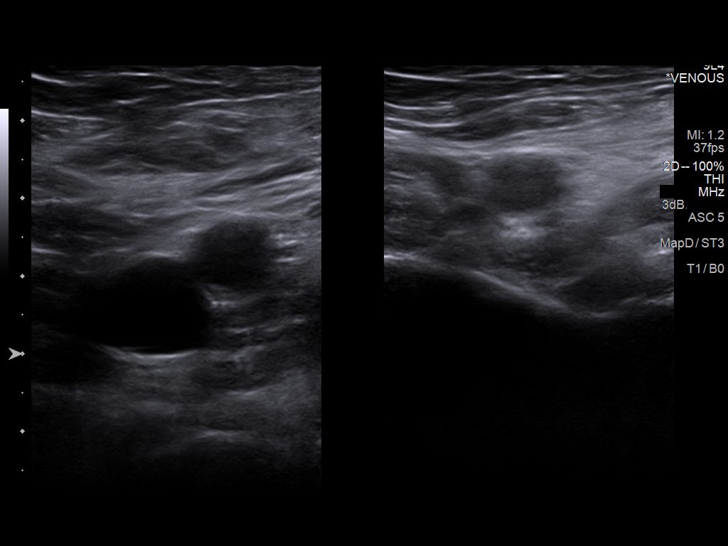
[im 4/37]
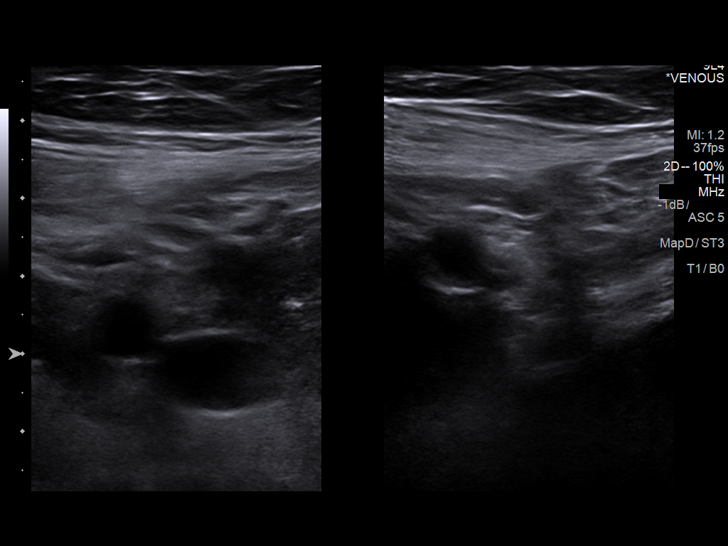
[im 7/37]
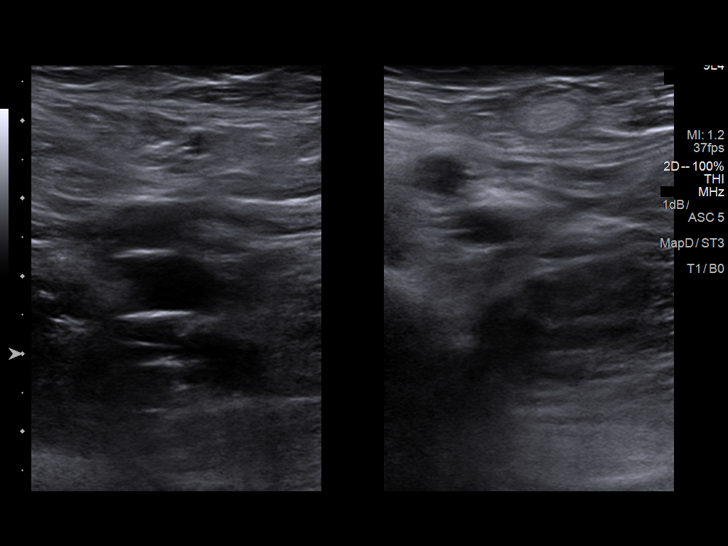
[im 10/37]
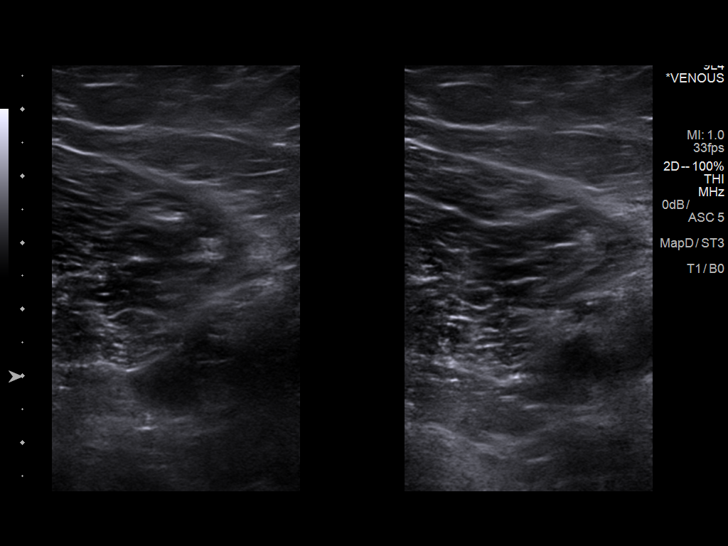
[im 11/37]
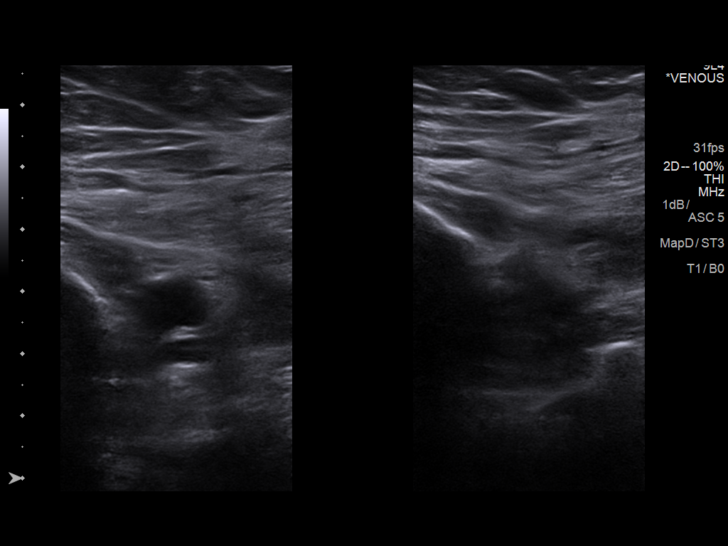
[im 15/37]
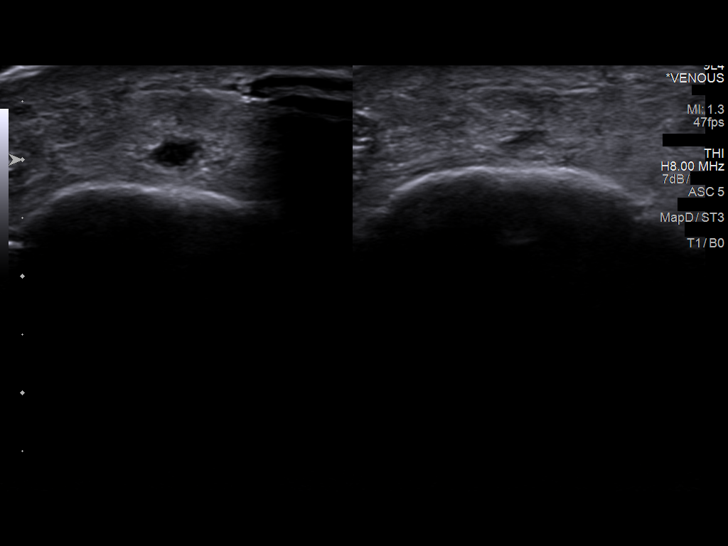
[im 18/37]
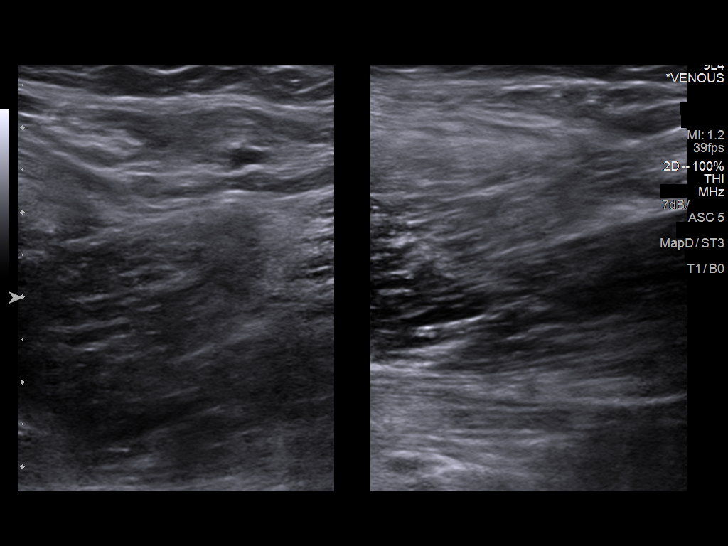
[im 19/37]
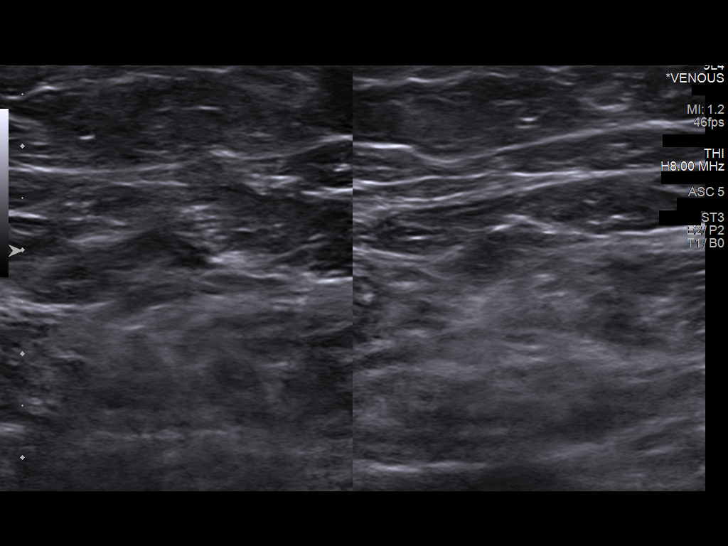
[im 22/37]
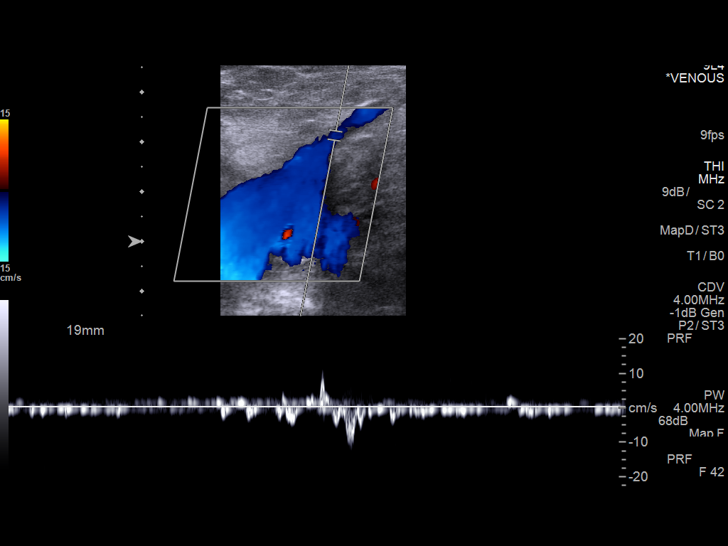
[im 26/37]
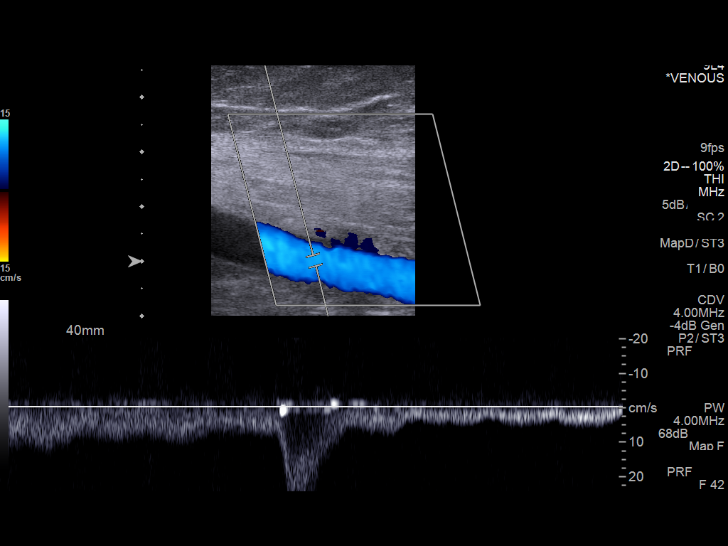
[im 29/37]
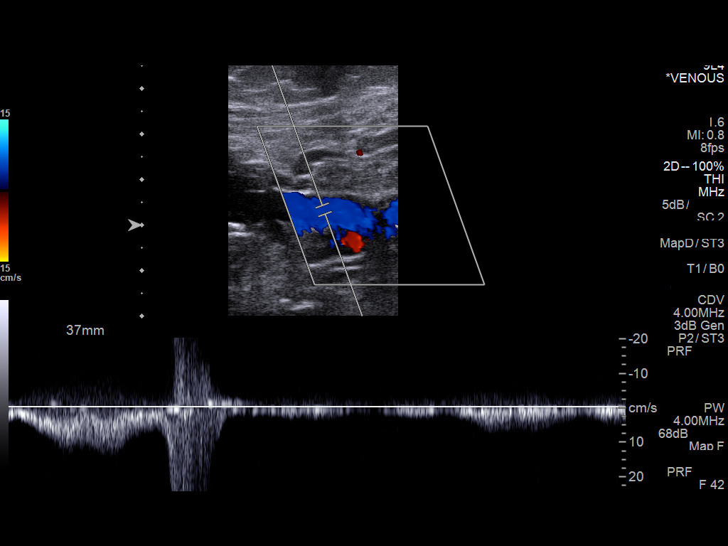
[im 30/37]
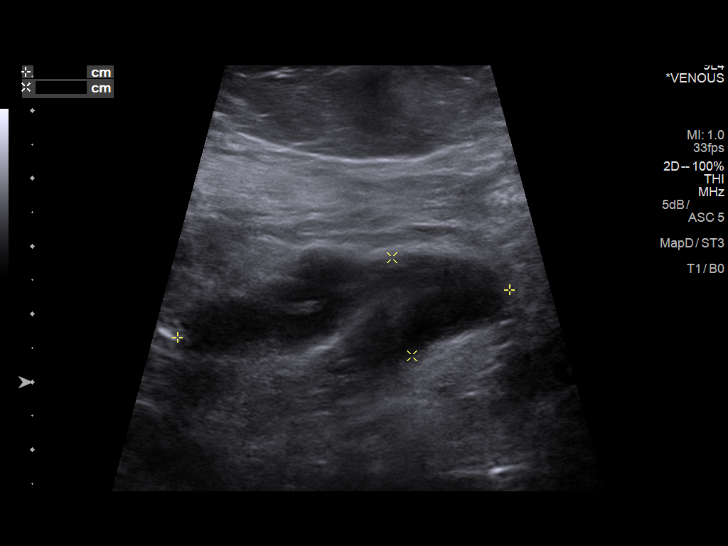
[im 33/37]
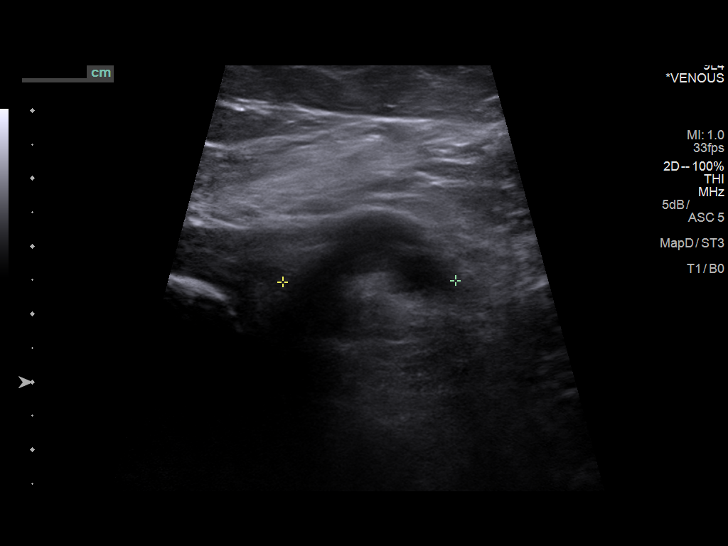
[im 37/37]
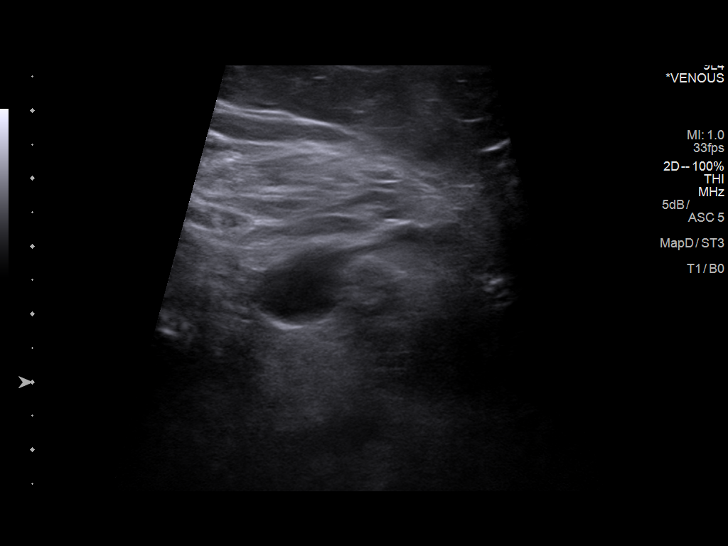

[14 of 24 positions shown; findings below may reference images not displayed]

FINDINGS: There is complete compressibility of the right common femoral,
femoral, and popliteal veins. Doppler analysis demonstrates
respiratory phasicity and augmentation of flow with calf
compression. No obvious superficial vein or calf vein thrombosis.

Cystic area in the medial right popliteal fossa is likely a small
Baker's cyst measuring 2.6 x 4.9 x 1.5 cm
IMPRESSION: No evidence of right lower extremity DVT.

Baker's cyst.

## 2019-10-20 DEATH — deceased
